# Patient Record
Sex: Male | Born: 1970 | Race: White | Hispanic: No | Marital: Married | Smoking: Never smoker
Health system: Southern US, Community
[De-identification: ages and names within clinical notes are randomized; demographics above are authoritative.]

## PROBLEM LIST (undated history)

## (undated) DIAGNOSIS — R0600 Dyspnea, unspecified: Secondary | ICD-10-CM

## (undated) DIAGNOSIS — I209 Angina pectoris, unspecified: Secondary | ICD-10-CM

## (undated) DIAGNOSIS — R000 Tachycardia, unspecified: Secondary | ICD-10-CM

## (undated) DIAGNOSIS — F419 Anxiety disorder, unspecified: Secondary | ICD-10-CM

## (undated) HISTORY — DX: Tachycardia, unspecified: R00.0

## (undated) HISTORY — PX: NO PAST SURGERIES: SHX2092

## (undated) HISTORY — DX: Anxiety disorder, unspecified: F41.9

---

## 2010-10-22 ENCOUNTER — Encounter: Payer: Self-pay | Admitting: Unknown Physician Specialty

## 2014-06-02 DIAGNOSIS — N509 Disorder of male genital organs, unspecified: Secondary | ICD-10-CM | POA: Insufficient documentation

## 2014-06-02 DIAGNOSIS — G43909 Migraine, unspecified, not intractable, without status migrainosus: Secondary | ICD-10-CM

## 2014-06-02 DIAGNOSIS — E291 Testicular hypofunction: Secondary | ICD-10-CM

## 2014-06-02 DIAGNOSIS — I10 Essential (primary) hypertension: Secondary | ICD-10-CM

## 2014-06-02 DIAGNOSIS — R10A0 Flank pain, unspecified side: Secondary | ICD-10-CM

## 2014-06-02 DIAGNOSIS — R109 Unspecified abdominal pain: Secondary | ICD-10-CM

## 2014-06-02 DIAGNOSIS — N411 Chronic prostatitis: Secondary | ICD-10-CM

## 2014-06-02 DIAGNOSIS — E7800 Pure hypercholesterolemia, unspecified: Secondary | ICD-10-CM

## 2014-06-02 HISTORY — DX: Flank pain, unspecified side: R10.A0

## 2014-06-02 HISTORY — DX: Testicular hypofunction: E29.1

## 2014-06-02 HISTORY — DX: Pure hypercholesterolemia, unspecified: E78.00

## 2014-06-02 HISTORY — DX: Essential (primary) hypertension: I10

## 2014-06-02 HISTORY — DX: Unspecified abdominal pain: R10.9

## 2014-06-02 HISTORY — DX: Chronic prostatitis: N41.1

## 2014-06-02 HISTORY — DX: Migraine, unspecified, not intractable, without status migrainosus: G43.909

## 2014-06-02 HISTORY — DX: Disorder of male genital organs, unspecified: N50.9

## 2017-04-02 DIAGNOSIS — N529 Male erectile dysfunction, unspecified: Secondary | ICD-10-CM | POA: Insufficient documentation

## 2017-04-02 HISTORY — DX: Male erectile dysfunction, unspecified: N52.9

## 2017-08-10 DIAGNOSIS — R079 Chest pain, unspecified: Secondary | ICD-10-CM | POA: Diagnosis not present

## 2020-06-15 DIAGNOSIS — R079 Chest pain, unspecified: Secondary | ICD-10-CM

## 2020-06-21 ENCOUNTER — Encounter: Payer: Self-pay | Admitting: *Deleted

## 2020-06-22 ENCOUNTER — Ambulatory Visit: Payer: BC Managed Care – PPO | Admitting: Cardiology

## 2020-06-22 ENCOUNTER — Other Ambulatory Visit: Payer: Self-pay

## 2020-06-22 ENCOUNTER — Encounter: Payer: Self-pay | Admitting: Cardiology

## 2020-06-22 VITALS — BP 140/100 | HR 78 | Ht 70.0 in | Wt 220.0 lb

## 2020-06-22 DIAGNOSIS — E669 Obesity, unspecified: Secondary | ICD-10-CM

## 2020-06-22 DIAGNOSIS — I10 Essential (primary) hypertension: Secondary | ICD-10-CM

## 2020-06-22 DIAGNOSIS — R002 Palpitations: Secondary | ICD-10-CM

## 2020-06-22 MED ORDER — DILTIAZEM HCL ER COATED BEADS 180 MG PO CP24: 180.0000 mg | ORAL_CAPSULE | Freq: Every day | ORAL | 1 refills | Status: DC

## 2020-06-22 MED ORDER — HYDROCHLOROTHIAZIDE 12.5 MG PO CAPS: 12.5000 mg | ORAL_CAPSULE | Freq: Every day | ORAL | 1 refills | Status: DC

## 2020-06-22 NOTE — Progress Notes (Signed)
Cardiology Office Note:    Date:  06/22/2020   ID:  Scott Mcguire, DOB [birth year], MRN [id number]  PCP:  Galvin Proffer, MD  Cardiologist:  Thomasene Ripple, DO  Electrophysiologist:  None   Referring MD: Galvin Proffer, MD   Chief Complaint  Patient presents with  . Chest Pain    History of Present Illness:    Scott Mcguire is a 49 y.o. male with a hx of hypertension, hypercholesteremia was referred by primary care doctor to be evaluated for palpitations.  The patient tells me that he has been experiencing intermittent palpitations.  He describes as abrupt onset of fast heartbeat which lasted for minutes sometimes hours at a time.  He noted that when this started in the beginning his PCP started him on Cardizem 120 mg which initially helped.  But over the last several weeks this has not helped at all.  The patient had an episode last Sunday where he was sitting and felt his heartbeat increase significantly in 10 since this was not improving he went to a panic attack his family did call EMS he was taken to the emergency department.  In emergency department he also complained about chest pain he was therefore admitted to the [facility].   During his hospitalization he underwent an exercise nuclear stress test.  The exercise portion of his nuclear stress test reported 8 METS with the patient achieving 93% of maximal age-predicted heart rate.  His peak blood pressure was 152/88 millimeters mercury.  Exercise time ~9 minutes 26 seconds.  He was reported to have occasional PVCs at rest.  The nuclear portion reported no reversible ischemia or infarction with normal wall motion.  However his EF was reported to be 48%.  The patient is here today, he tells me that he still is experiencing intermittent palpitations.  Past Medical History:  Diagnosis Date  . Anxiety   . Chronic prostatitis 06/02/2014  . Disorder of male genital organs 06/02/2014  . Flank pain 06/02/2014  .  Hypercholesterolemia 06/02/2014  . Hypertension 06/02/2014  . Hypogonadism in male 06/02/2014  . Migraine headache 06/02/2014  . 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 Tachycardia     Past Surgical History:  Procedure Laterality Date  . NO PAST SURGERIES      Current Medications: Current Meds  Medication Sig  . ALPRAZolam (XANAX) 1 MG tablet Take 1 mg by mouth at bedtime as needed.   Marland Kitchen atorvastatin (LIPITOR) 80 MG tablet Take 80 mg by mouth daily.  Marland Kitchen omeprazole (PRILOSEC) 40 MG capsule Take 40 mg by mouth daily.  . tadalafil (CIALIS) 5 MG tablet Take 5 mg by mouth daily as needed.  . [DISCONTINUED] diltiazem (TIAZAC) 120 MG 24 hr capsule Take 120 mg by mouth every morning.     Allergies:   Cefuroxime axetil   Social History   Socioeconomic History  . Marital status: Unknown    Spouse name: Not on file  . Number of children: Not on file  . Years of education: Not on file  . Highest education level: Not on file  Occupational History  . Not on file  Tobacco Use  . Smoking status: Never Smoker  . Smokeless tobacco: Never Used  Substance and Sexual Activity  . Alcohol use: Never  . Drug use: Never  . Sexual activity: Not on file  Other Topics Concern  . Not on file  Social History Narrative  . Not on file   Social Determinants of Health   Financial  Resource Strain:   . Difficulty of Paying Living Expenses: Not on file  Food Insecurity:   . Worried About Programme researcher, broadcasting/film/video in the Last Year: Not on file  . Ran Out of Food in the Last Year: Not on file  Transportation Needs:   . Lack of Transportation (Medical): Not on file  . Lack of Transportation (Non-Medical): Not on file  Physical Activity:   . Days of Exercise per Week: Not on file  . Minutes of Exercise per Session: Not on file  Stress:   . Feeling of Stress : Not on file  Social Connections:   . Frequency of Communication with Friends and Family: Not on file  . Frequency of Social Gatherings with Friends and Family:  Not on file  . Attends Religious Services: Not on file  . Active Member of Clubs or Organizations: Not on file  . Attends Banker Meetings: Not on file  . Marital Status: Not on file     Family History: The patient's family history includes Heart disease in his father and mother.  ROS:   Review of Systems  Constitution: Negative for decreased appetite, fever and weight gain.  HENT: Negative for congestion, ear discharge, hoarse voice and sore throat.   Eyes: Negative for discharge, redness, vision loss in right eye and visual halos.  Cardiovascular: Negative for chest pain, dyspnea on exertion, leg swelling, orthopnea and palpitations.  Respiratory: Negative for cough, hemoptysis, shortness of breath and snoring.   Endocrine: Negative for heat intolerance and polyphagia.  Hematologic/Lymphatic: Negative for bleeding problem. Does not bruise/bleed easily.  Skin: Negative for flushing, nail changes, rash and suspicious lesions.  Musculoskeletal: Negative for arthritis, joint pain, muscle cramps, myalgias, neck pain and stiffness.  Gastrointestinal: Negative for abdominal pain, bowel incontinence, diarrhea and excessive appetite.  Genitourinary: Negative for decreased libido, genital sores and incomplete emptying.  Neurological: Negative for brief paralysis, focal weakness, headaches and loss of balance.  Psychiatric/Behavioral: Negative for altered mental status, depression and suicidal ideas.  Allergic/Immunologic: Negative for HIV exposure and persistent infections.    EKGs/Labs/Other Studies Reviewed:    The following studies were reviewed today:   EKG:  The ekg ordered today demonstrates sinus rhythm, heart rate 72 bpm.  Echocardiogram done on May 19, 2020 with his PCP office showed normal left ventricular size and function.  Normal left medical systolic function.  Ejection fraction 55 to 60%.  Mild concentric left ventricular hypertrophy.  Normal diastolic  function.  Normal right ventricle size and systolic function.  Trace mitral regurgitation.  Mild tricuspid regurgitation.  Recent Labs: No results found for requested labs within last 8760 hours.  Recent Lipid Panel No results found for: CHOL, TRIG, HDL, CHOLHDL, VLDL, LDLCALC, LDLDIRECT  Physical Exam:    VS:  BP (!) 140/100 (BP Location: Left Arm, Patient Position: Sitting, Cuff Size: Normal)   Pulse 78   Ht 5\' 10"  (1.778 m)   Wt 220 lb (99.8 kg)   SpO2 98%   BMI 31.57 kg/m     Wt Readings from Last 3 Encounters:  06/22/20 220 lb (99.8 kg)     GEN: Well nourished, well developed in no acute distress HEENT: Normal NECK: No JVD; No carotid bruits LYMPHATICS: No lymphadenopathy CARDIAC: S1S2 noted,RRR, no murmurs, rubs, gallops RESPIRATORY:  Clear to auscultation without rales, wheezing or rhonchi  ABDOMEN: Soft, non-tender, non-distended, +bowel sounds, no guarding. EXTREMITIES: No edema, No cyanosis, no clubbing MUSCULOSKELETAL:  No deformity  SKIN: Warm and  dry NEUROLOGIC:  Alert and oriented x 3, non-focal PSYCHIATRIC:  Normal affect, good insight  ASSESSMENT:    1. Palpitations   2. Essential hypertension   3. Obesity (BMI 30-39.9)    PLAN:     His palpitations are concerning and the frequency is higher.  I have discussed with the patient and he is agreeable to placed a ZIO monitor for 3 days.  I am hoping to be able to understand if there is any underlying arrhythmia with his palpitations.  I was able to review his echocardiogram done by his PCP office which show EF of 55 to 60%.  With mild left ventricular concentric hypertrophy.  There is mild tricuspid regurgitation and trace mitral regurgitation.  He is hypertensive in the office.  He was on hydrochlorothiazide which he responded to a greatly and was stopped due to the start of Cardizem.  I am going to restart his hydrochlorothiazide 12.5 mg daily.   Also increase his Cardizem to 180 mg daily.  He will start  this dosing right after he takes off his 3-day monitor.  The patient understands the need to lose weight with diet and exercise. We have discussed specific strategies for this.  The patient is in agreement with the above plan. The patient left the office in stable condition.  The patient will follow up in 1 month due to medication change.   Medication Adjustments/Labs and Tests Ordered: Current medicines are reviewed at length with the patient today.  Concerns regarding medicines are outlined above.  No orders of the defined types were placed in this encounter.  No orders of the defined types were placed in this encounter.   There are no Patient Instructions on file for this visit.   Adopting a Healthy Lifestyle.  Know what a healthy weight is for you (roughly BMI <25) and aim to maintain this   Aim for 7+ servings of fruits and vegetables daily   65-80+ fluid ounces of water or unsweet tea for healthy kidneys   Limit to max 1 drink of alcohol per day; avoid smoking/tobacco   Limit animal fats in diet for cholesterol and heart health - choose grass fed whenever available   Avoid highly processed foods, and foods high in saturated/trans fats   Aim for low stress - take time to unwind and care for your mental health   Aim for 150 min of moderate intensity exercise weekly for heart health, and weights twice weekly for bone health   Aim for 7-9 hours of sleep daily   When it comes to diets, agreement about the perfect plan isnt easy to find, even among the experts. Experts at the [facility] [employer] developed an idea known as the Healthy Eating Plate. Just imagine a plate divided into logical, healthy portions.   The emphasis is on diet quality:   Load up on vegetables and fruits - one-half of your plate: Aim for color and variety, and remember that potatoes dont count.   Go for whole grains - one-quarter of your plate: Whole wheat, barley, wheat berries, quinoa,  oats, brown rice, and foods made with them. If you want pasta, go with whole wheat pasta.   Protein power - one-quarter of your plate: Fish, chicken, beans, and nuts are all healthy, versatile protein sources. Limit red meat.   The diet, however, does go beyond the plate, offering a few other suggestions.   Use healthy plant oils, such as olive, canola, soy, corn, sunflower and  peanut. Check the labels, and avoid partially hydrogenated oil, which have unhealthy trans fats.   If youre thirsty, drink water. Coffee and tea are good in moderation, but skip sugary drinks and limit milk and dairy products to one or two daily servings.   The type of carbohydrate in the diet is more important than the amount. Some sources of carbohydrates, such as vegetables, fruits, whole grains, and beans-are healthier than others.   Finally, stay active  Signed, Thomasene RippleKardie Briona Korpela, DO  06/22/2020 10:30 AM    [facility]

## 2020-06-22 NOTE — Patient Instructions (Signed)
Medication Instructions:  Your physician has recommended you make the following change in your medication:   INCREASE: Cardizem to 180 mg daily   START: Hydrochlorothiazide to 12.5 mg daily   *If you need a refill on your cardiac medications before your next appointment, please call your pharmacy*   Lab Work: None.  If you have labs (blood work) drawn today and your tests are completely normal, you will receive your results only by: Marland Kitchen MyChart Message (if you have MyChart) OR . A paper copy in the mail If you have any lab test that is abnormal or we need to change your treatment, we will call you to review the results.   Testing/Procedures: A zio monitor was ordered today. It will remain on for 3 days. You will then return monitor and event diary in provided box. It takes 1-2 weeks for report to be downloaded and returned to [place]. We will call you with the results. If monitor falls off or has orange flashing light, please call Zio for further instructions.      Follow-Up: At [facility], you and your health needs are our priority.  As part of our continuing mission to provide you with exceptional heart care, we have created designated Provider Care Teams.  These Care Teams include your primary Cardiologist (physician) and Advanced Practice Providers (APPs -  Physician Assistants and Nurse Practitioners) who all work together to provide you with the care you need, when you need it.  We recommend signing up for the patient portal called "MyChart".  Sign up information is provided on this After Visit Summary.  MyChart is used to connect with patients for Virtual Visits (Telemedicine).  Patients are able to view lab/test results, encounter notes, upcoming appointments, etc.  Non-urgent messages can be sent to your provider as well.   To learn more about what you can do with MyChart, go to [email or web address].    Your next appointment:   1 month(s)  The format for your next  appointment:   In Person  Provider:   Thomasene Ripple, DO   Other Instructions

## 2020-06-26 ENCOUNTER — Ambulatory Visit (INDEPENDENT_AMBULATORY_CARE_PROVIDER_SITE_OTHER): Payer: BC Managed Care – PPO

## 2020-06-26 DIAGNOSIS — R002 Palpitations: Secondary | ICD-10-CM

## 2020-07-23 ENCOUNTER — Encounter: Payer: Self-pay | Admitting: Cardiology

## 2020-07-23 ENCOUNTER — Ambulatory Visit: Payer: BC Managed Care – PPO | Admitting: Cardiology

## 2020-07-23 ENCOUNTER — Other Ambulatory Visit: Payer: Self-pay

## 2020-07-23 VITALS — BP 130/88 | HR 99 | Ht 70.0 in | Wt 221.8 lb

## 2020-07-23 DIAGNOSIS — I493 Ventricular premature depolarization: Secondary | ICD-10-CM

## 2020-07-23 DIAGNOSIS — I471 Supraventricular tachycardia: Secondary | ICD-10-CM | POA: Insufficient documentation

## 2020-07-23 DIAGNOSIS — I4719 Other supraventricular tachycardia: Secondary | ICD-10-CM

## 2020-07-23 DIAGNOSIS — E669 Obesity, unspecified: Secondary | ICD-10-CM

## 2020-07-23 DIAGNOSIS — I10 Essential (primary) hypertension: Secondary | ICD-10-CM | POA: Diagnosis not present

## 2020-07-23 HISTORY — DX: Other supraventricular tachycardia: I47.19

## 2020-07-23 HISTORY — DX: Ventricular premature depolarization: I49.3

## 2020-07-23 HISTORY — DX: Obesity, unspecified: E66.9

## 2020-07-23 MED ORDER — DILTIAZEM HCL ER COATED BEADS 240 MG PO CP24: 240.0000 mg | ORAL_CAPSULE | Freq: Every day | ORAL | 3 refills | Status: DC

## 2020-07-23 NOTE — Progress Notes (Signed)
Cardiology Office Note:    Date:  07/23/2020   ID:  Scott Mcguire, DOB 1971-02-09, MRN [id number]  PCP:  Galvin Proffer, MD  Cardiologist:  Thomasene Ripple, DO  Electrophysiologist:  None   Referring MD: Galvin Proffer, MD   " I am doing well"  History of Present Illness:    Scott Mcguire is a 49 y.o. male with a hx of hypertension, hypercholesteremia comes today for follow-up visit. Did see the patient on 06/22/2020 after he had been discharged from the [facility].  We reviewed his hospital records which showed that he had adequate exercise time but occasional PVCs. Because of this I placed a monitor on the patient.  He is here today to discuss his testing results.  Past Medical History:  Diagnosis Date  . Anxiety   . Chronic prostatitis 06/02/2014  . Disorder of male genital organs 06/02/2014  . Flank pain 06/02/2014  . Hypercholesterolemia 06/02/2014  . Hypertension 06/02/2014  . Hypogonadism in male 06/02/2014  . Migraine headache 06/02/2014  . 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 Tachycardia     Past Surgical History:  Procedure Laterality Date  . NO PAST SURGERIES      Current Medications: Current Meds  Medication Sig  . ALPRAZolam (XANAX) 1 MG tablet Take 1 mg by mouth at bedtime as needed.   Marland Kitchen atorvastatin (LIPITOR) 80 MG tablet Take 80 mg by mouth daily.  . hydrochlorothiazide (MICROZIDE) 12.5 MG capsule Take 1 capsule (12.5 mg total) by mouth daily.  Marland Kitchen omeprazole (PRILOSEC) 40 MG capsule Take 40 mg by mouth daily.  . tadalafil (CIALIS) 5 MG tablet Take 5 mg by mouth daily as needed.  . [DISCONTINUED] diltiazem (CARDIZEM CD) 180 MG 24 hr capsule Take 1 capsule (180 mg total) by mouth daily.     Allergies:   Cefuroxime axetil   Social History   Socioeconomic History  . Marital status: Unknown    Spouse name: Not on file  . Number of children: Not on file  . Years of education: Not on file  . Highest education level: Not on file  Occupational  History  . Not on file  Tobacco Use  . Smoking status: Never Smoker  . Smokeless tobacco: Never Used  Substance and Sexual Activity  . Alcohol use: Never  . Drug use: Never  . Sexual activity: Not on file  Other Topics Concern  . Not on file  Social History Narrative  . Not on file   Social Determinants of Health   Financial Resource Strain:   . Difficulty of Paying Living Expenses: Not on file  Food Insecurity:   . Worried About Programme researcher, broadcasting/film/video in the Last Year: Not on file  . Ran Out of Food in the Last Year: Not on file  Transportation Needs:   . Lack of Transportation (Medical): Not on file  . Lack of Transportation (Non-Medical): Not on file  Physical Activity:   . Days of Exercise per Week: Not on file  . Minutes of Exercise per Session: Not on file  Stress:   . Feeling of Stress : Not on file  Social Connections:   . Frequency of Communication with Friends and Family: Not on file  . Frequency of Social Gatherings with Friends and Family: Not on file  . Attends Religious Services: Not on file  . Active Member of Clubs or Organizations: Not on file  . Attends Banker Meetings: Not on file  . Marital Status:  Not on file     Family History: The patient's family history includes Heart disease in his father and mother.  ROS:   Review of Systems  Constitution: Negative for decreased appetite, fever and weight gain.  HENT: Negative for congestion, ear discharge, hoarse voice and sore throat.   Eyes: Negative for discharge, redness, vision loss in right eye and visual halos.  Cardiovascular: Negative for chest pain, dyspnea on exertion, leg swelling, orthopnea and palpitations.  Respiratory: Negative for cough, hemoptysis, shortness of breath and snoring.   Endocrine: Negative for heat intolerance and polyphagia.  Hematologic/Lymphatic: Negative for bleeding problem. Does not bruise/bleed easily.  Skin: Negative for flushing, nail changes, rash and  suspicious lesions.  Musculoskeletal: Negative for arthritis, joint pain, muscle cramps, myalgias, neck pain and stiffness.  Gastrointestinal: Negative for abdominal pain, bowel incontinence, diarrhea and excessive appetite.  Genitourinary: Negative for decreased libido, genital sores and incomplete emptying.  Neurological: Negative for brief paralysis, focal weakness, headaches and loss of balance.  Psychiatric/Behavioral: Negative for altered mental status, depression and suicidal ideas.  Allergic/Immunologic: Negative for HIV exposure and persistent infections.    EKGs/Labs/Other Studies Reviewed:    The following studies were reviewed today:   EKG:  The ekg ordered today demonstrates   ZIO monitor  The patient wore the monitor for 3 days 1 hour starting 06/26/2020. Indication: Palpitations  The minimum heart rate was 52 bpm, maximum heart rate was 196 bpm, and average heart rate was 83 bpm. Predominant underlying rhythm was Sinus Rhythm.  2 Supraventricular Tachycardia runs occurred, the run with the fastest interval lasting 5 beats with a maximum rate of 171 bpm (average 146 bpm); the run with the fastest interval was also the longest.  Premature atrial complexes were rare.  Premature Ventricular complexes were occasional (1.0%, 3730).  No ventricular tachycardia, no pauses, No AV block and no atrial fibrillation present. 3 patient triggered events all associated with premature ventricular complex. 5 diary events 3 associated with premature ventricular complex and the remaining with sinus rhythm.  Conclusion: This study is remarkable for the following:                             1. Rare asymptomatic atrial tachycardia with variable block.                             2. Occasional premature ventricular complexes.  Recent Labs: No results found for requested labs within last 8760 hours.  Recent Lipid Panel No results found for: CHOL, TRIG, HDL, CHOLHDL, VLDL, LDLCALC,  LDLDIRECT  Physical Exam:    VS:  BP 130/88   Pulse 99   Ht 5\' 10"  (1.778 m)   Wt 221 lb 12.8 oz (100.6 kg)   SpO2 97%   BMI 31.82 kg/m     Wt Readings from Last 3 Encounters:  07/23/20 221 lb 12.8 oz (100.6 kg)  06/22/20 220 lb (99.8 kg)     GEN: Well nourished, well developed in no acute distress HEENT: Normal NECK: No JVD; No carotid bruits LYMPHATICS: No lymphadenopathy CARDIAC: S1S2 noted,RRR, no murmurs, rubs, gallops RESPIRATORY:  Clear to auscultation without rales, wheezing or rhonchi  ABDOMEN: Soft, non-tender, non-distended, +bowel sounds, no guarding. EXTREMITIES: No edema, No cyanosis, no clubbing MUSCULOSKELETAL:  No deformity  SKIN: Warm and dry NEUROLOGIC:  Alert and oriented x 3, non-focal PSYCHIATRIC:  Normal affect, good insight  ASSESSMENT:    1. Primary hypertension   2. Symptomatic PVCs   3. PAT (paroxysmal atrial tachycardia) (HCC)   4. Obesity (BMI 30-39.9)    PLAN:    He still is having some palpitations. He is now well controlled on the dose of Cardizem now. Talked to the patient like to increase his Cardizem to 240 mg daily as his monitor did show some symptomatic PVCs. He is in agreement with this.  I did educate the patient about the side effects of this medication of his questions has been answered. His blood pressure will be able to tolerate this medication.   The patient understands the need to lose weight with diet and exercise. We have discussed specific strategies for this.  The patient is in agreement with the above plan. The patient left the office in stable condition.  The patient will follow up in 3 months or sooner if needed.  Medication Adjustments/Labs and Tests Ordered: Current medicines are reviewed at length with the patient today.  Concerns regarding medicines are outlined above.  No orders of the defined types were placed in this encounter.  Meds ordered this encounter  Medications  . diltiazem (CARDIZEM CD) 240 MG 24  hr capsule    Sig: Take 1 capsule (240 mg total) by mouth daily.    Dispense:  90 capsule    Refill:  3    DOSE INCREASE    Patient Instructions  Medication Instructions:  Your physician has recommended you make the following change in your medication:  -- INCREASE Cardizem (Diltiazem) to 240 mg - Take 1 tablet (240 mg) by mouth daily -- NEW RX SENT --  *If you need a refill on your cardiac medications before your next appointment, please call your pharmacy*  Follow-Up: At [facility], you and your health needs are our priority.  As part of our continuing mission to provide you with exceptional heart care, we have created designated Provider Care Teams.  These Care Teams include your primary Cardiologist (physician) and Advanced Practice Providers (APPs -  Physician Assistants and Nurse Practitioners) who all work together to provide you with the care you need, when you need it.  We recommend signing up for the patient portal called "MyChart".  Sign up information is provided on this After Visit Summary.  MyChart is used to connect with patients for Virtual Visits (Telemedicine).  Patients are able to view lab/test results, encounter notes, upcoming appointments, etc.  Non-urgent messages can be sent to your provider as well.   To learn more about what you can do with MyChart, go to [email or web address].    Your next appointment:   Your physician recommends that you schedule a follow-up appointment in: 3 MONTHS with Dr. Servando Salinaobb  The format for your next appointment:   In Person with Thomasene RippleKardie Annessa Satre, DO        Adopting a Healthy Lifestyle.  Know what a healthy weight is for you (roughly BMI <25) and aim to maintain this   Aim for 7+ servings of fruits and vegetables daily   65-80+ fluid ounces of water or unsweet tea for healthy kidneys   Limit to max 1 drink of alcohol per day; avoid smoking/tobacco   Limit animal fats in diet for cholesterol and heart health - choose grass fed  whenever available   Avoid highly processed foods, and foods high in saturated/trans fats   Aim for low stress - take time to unwind and care for your mental health  Aim for 150 min of moderate intensity exercise weekly for heart health, and weights twice weekly for bone health   Aim for 7-9 hours of sleep daily   When it comes to diets, agreement about the perfect plan isnt easy to find, even among the experts. Experts at the [facility] [employer] developed an idea known as the Healthy Eating Plate. Just imagine a plate divided into logical, healthy portions.   The emphasis is on diet quality:   Load up on vegetables and fruits - one-half of your plate: Aim for color and variety, and remember that potatoes dont count.   Go for whole grains - one-quarter of your plate: Whole wheat, barley, wheat berries, quinoa, oats, brown rice, and foods made with them. If you want pasta, go with whole wheat pasta.   Protein power - one-quarter of your plate: Fish, chicken, beans, and nuts are all healthy, versatile protein sources. Limit red meat.   The diet, however, does go beyond the plate, offering a few other suggestions.   Use healthy plant oils, such as olive, canola, soy, corn, sunflower and peanut. Check the labels, and avoid partially hydrogenated oil, which have unhealthy trans fats.   If youre thirsty, drink water. Coffee and tea are good in moderation, but skip sugary drinks and limit milk and dairy products to one or two daily servings.   The type of carbohydrate in the diet is more important than the amount. Some sources of carbohydrates, such as vegetables, fruits, whole grains, and beans-are healthier than others.   Finally, stay active  Signed, Thomasene Ripple, DO  07/23/2020 1:31 PM    [facility]

## 2020-07-23 NOTE — Patient Instructions (Signed)
Medication Instructions:  Your physician has recommended you make the following change in your medication:  -- INCREASE Cardizem (Diltiazem) to 240 mg - Take 1 tablet (240 mg) by mouth daily -- NEW RX SENT --  *If you need a refill on your cardiac medications before your next appointment, please call your pharmacy*  Follow-Up: At [facility], you and your health needs are our priority.  As part of our continuing mission to provide you with exceptional heart care, we have created designated Provider Care Teams.  These Care Teams include your primary Cardiologist (physician) and Advanced Practice Providers (APPs -  Physician Assistants and Nurse Practitioners) who all work together to provide you with the care you need, when you need it.  We recommend signing up for the patient portal called "MyChart".  Sign up information is provided on this After Visit Summary.  MyChart is used to connect with patients for Virtual Visits (Telemedicine).  Patients are able to view lab/test results, encounter notes, upcoming appointments, etc.  Non-urgent messages can be sent to your provider as well.   To learn more about what you can do with MyChart, go to [email or web address].    Your next appointment:   Your physician recommends that you schedule a follow-up appointment in: 3 MONTHS with Dr. Servando Salina  The format for your next appointment:   In Person with Thomasene Ripple, DO

## 2020-10-28 DIAGNOSIS — F419 Anxiety disorder, unspecified: Secondary | ICD-10-CM | POA: Insufficient documentation

## 2020-10-28 DIAGNOSIS — R000 Tachycardia, unspecified: Secondary | ICD-10-CM | POA: Insufficient documentation

## 2020-10-29 ENCOUNTER — Ambulatory Visit: Payer: BC Managed Care – PPO | Admitting: Cardiology

## 2020-10-29 ENCOUNTER — Encounter: Payer: Self-pay | Admitting: Cardiology

## 2020-10-29 ENCOUNTER — Other Ambulatory Visit: Payer: Self-pay

## 2020-10-29 VITALS — BP 138/80 | HR 76 | Ht 70.0 in | Wt 228.2 lb

## 2020-10-29 DIAGNOSIS — R0600 Dyspnea, unspecified: Secondary | ICD-10-CM | POA: Diagnosis not present

## 2020-10-29 DIAGNOSIS — R0609 Other forms of dyspnea: Secondary | ICD-10-CM

## 2020-10-29 DIAGNOSIS — I471 Supraventricular tachycardia: Secondary | ICD-10-CM

## 2020-10-29 DIAGNOSIS — I10 Essential (primary) hypertension: Secondary | ICD-10-CM

## 2020-10-29 DIAGNOSIS — E782 Mixed hyperlipidemia: Secondary | ICD-10-CM

## 2020-10-29 DIAGNOSIS — I493 Ventricular premature depolarization: Secondary | ICD-10-CM

## 2020-10-29 HISTORY — DX: Mixed hyperlipidemia: E78.2

## 2020-10-29 HISTORY — DX: Essential (primary) hypertension: I10

## 2020-10-29 HISTORY — DX: Other forms of dyspnea: R06.09

## 2020-10-29 MED ORDER — METOPROLOL TARTRATE 100 MG PO TABS: ORAL_TABLET | ORAL | 0 refills | Status: DC

## 2020-10-29 MED ORDER — PROPRANOLOL HCL 20 MG PO TABS: ORAL_TABLET | ORAL | 2 refills | Status: DC

## 2020-10-29 NOTE — Patient Instructions (Addendum)
Medication Instructions:  Your physician has recommended you make the following change in your medication:  START: Propanolol 20 mg as needed if heart rate is greater than 120.  *If you need a refill on your cardiac medications before your next appointment, please call your pharmacy*   Lab Work: Your physician recommends that you return for lab work: Within one week of your cardiac CT BMP  If you have labs (blood work) drawn today and your tests are completely normal, you will receive your results only by: Marland Kitchen MyChart Message (if you have MyChart) OR . A paper copy in the mail If you have any lab test that is abnormal or we need to change your treatment, we will call you to review the results.   Testing/Procedures: Your cardiac CT will be scheduled at the below location:   [facility] [address] [place], [place] [address] [phone]   If scheduled at [facility], please arrive at the [facility] main entrance of [facility] 30 minutes prior to test start time. Proceed to the [facility] Radiology Department (first floor) to check-in and test prep.   Please follow these instructions carefully (unless otherwise directed):  Hold all erectile dysfunction medications at least 3 days (72 hrs) prior to test.  On the Night Before the Test: . Be sure to Drink plenty of water. . Do not consume any caffeinated/decaffeinated beverages or chocolate 12 hours prior to your test. . Do not take any antihistamines 12 hours prior to your test.  On the Day of the Test: . Drink plenty of water. Do not drink any water within one hour of the test. . Do not eat any food 4 hours prior to the test. . You may take your regular medications prior to the test.  . Take metoprolol (Lopressor) two hours prior to test. . HOLD Hydrochlorothiazide morning of the test.        After the Test: . Drink plenty of water. . After receiving IV contrast, you may experience a  mild flushed feeling. This is normal. . On occasion, you may experience a mild rash up to 24 hours after the test. This is not dangerous. If this occurs, you can take Benadryl 25 mg and increase your fluid intake. . If you experience trouble breathing, this can be serious. If it is severe call 911 IMMEDIATELY. If it is mild, please call our office. . If you take any of these medications: Glipizide/Metformin, Avandament, Glucavance, please do not take 48 hours after completing test unless otherwise instructed.   Once we have confirmed authorization from your insurance company, we will call you to set up a date and time for your test. Based on how quickly your insurance processes prior authorizations requests, please allow up to 4 weeks to be contacted for scheduling your Cardiac CT appointment. Be advised that routine Cardiac CT appointments could be scheduled as many as 8 weeks after your provider has ordered it.  For non-scheduling related questions, please contact the cardiac imaging nurse navigator should you have any questions/concerns: Marchia Bond, Cardiac Imaging Nurse Navigator Burley Saver, Interim Cardiac Imaging Nurse [place] [facility] Direct Office Dial: [phone]   For scheduling needs, including cancellations and rescheduling, please call [place], [phone].     Follow-Up: At [facility], you and your health needs are our priority.  As part of our continuing mission to provide you with exceptional heart care, we have created designated Provider Care  Teams.  These Care Teams include your primary Cardiologist (physician) and Advanced Practice Providers (APPs -  Physician Assistants and Nurse Practitioners) who all work together to provide you with the care you need, when you need it.  We recommend signing up for the patient portal called "MyChart".  Sign up information is provided on this After Visit Summary.  MyChart is used to connect with  patients for Virtual Visits (Telemedicine).  Patients are able to view lab/test results, encounter notes, upcoming appointments, etc.  Non-urgent messages can be sent to your provider as well.   To learn more about what you can do with MyChart, go to [email or web address].    Your next appointment:   6 month(s)  The format for your next appointment:   In Person  Provider:   Berniece Salines, DO   Other Instructions

## 2020-10-29 NOTE — Progress Notes (Signed)
Cardiology Office Note:    Date:  10/29/2020   ID:  Scott Mcguire, DOB [birth year], MRN [id number]  PCP:  Bonnita Nasuti, MD  Cardiologist:  Berniece Salines, DO  Electrophysiologist:  None   Referring MD: Bonnita Nasuti, MD   I am still having shortness of breath and is very bothersome.  History of Present Illness:    Scott Mcguire is a 50 y.o. male with a hx of hypertension, hypercholesterolemia, symptomatic PVCs, versus major tachycardia is here today for follow-up visit.  I saw the patient October 2021 at that time he was well controlled with his Cardizem we increased the dose to 240 mg to help with his symptoms.  Today he tells me that his palpitation has improved with only few intermittent episodes but he is having significant shortness of breath on exertion.  He notes that this is the most bothersome symptoms.  His ongoing significant chest pain.  But he tells me on exertion he is very dyspneic.  Past Medical History:  Diagnosis Date  . Anxiety   . Chronic prostatitis 06/02/2014  . Disorder of male genital organs 06/02/2014  . Flank pain 06/02/2014  . Hypercholesterolemia 06/02/2014  . Hypertension 06/02/2014  . Hypogonadism in male 06/02/2014  . Migraine headache 06/02/2014  . 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 Tachycardia     Past Surgical History:  Procedure Laterality Date  . NO PAST SURGERIES      Current Medications: Current Meds  Medication Sig  . ALPRAZolam (XANAX) 1 MG tablet Take 1 mg by mouth at bedtime as needed.   Marland Kitchen atorvastatin (LIPITOR) 80 MG tablet Take 80 mg by mouth daily.  Marland Kitchen diltiazem (CARDIZEM CD) 240 MG 24 hr capsule Take 1 capsule (240 mg total) by mouth daily.  . hydrochlorothiazide (MICROZIDE) 12.5 MG capsule Take 1 capsule (12.5 mg total) by mouth daily.  . metoprolol tartrate (LOPRESSOR) 100 MG tablet Take 2 hours prior to Cardiac CT  . omeprazole (PRILOSEC) 40 MG capsule Take 40 mg by mouth daily.  . propranolol (INDERAL) 20 MG tablet Take one tablet  by mouth as needed if heart rate greater then 120  . tadalafil (CIALIS) 5 MG tablet Take 5 mg by mouth daily as needed.     Allergies:   Cefuroxime axetil   Social History   Socioeconomic History  . Marital status: Unknown    Spouse name: Not on file  . Number of children: Not on file  . Years of education: Not on file  . Highest education level: Not on file  Occupational History  . Not on file  Tobacco Use  . Smoking status: Never Smoker  . Smokeless tobacco: Never Used  Substance and Sexual Activity  . Alcohol use: Never  . Drug use: Never  . Sexual activity: Not on file  Other Topics Concern  . Not on file  Social History Narrative  . Not on file   Social Determinants of Health   Financial Resource Strain: Not on file  Food Insecurity: Not on file  Transportation Needs: Not on file  Physical Activity: Not on file  Stress: Not on file  Social Connections: Not on file     Family History: The patient's family history includes Heart disease in his father and mother.  ROS:   Review of Systems  Constitution: Negative for decreased appetite, fever and weight gain.  HENT: Negative for congestion, ear discharge, hoarse voice and sore throat.   Eyes: Negative for discharge, redness, vision loss in  right eye and visual halos.  Cardiovascular: Negative for chest pain, dyspnea on exertion, leg swelling, orthopnea and palpitations.  Respiratory: Negative for cough, hemoptysis, shortness of breath and snoring.   Endocrine: Negative for heat intolerance and polyphagia.  Hematologic/Lymphatic: Negative for bleeding problem. Does not bruise/bleed easily.  Skin: Negative for flushing, nail changes, rash and suspicious lesions.  Musculoskeletal: Negative for arthritis, joint pain, muscle cramps, myalgias, neck pain and stiffness.  Gastrointestinal: Negative for abdominal pain, bowel incontinence, diarrhea and excessive appetite.  Genitourinary: Negative for decreased libido,  genital sores and incomplete emptying.  Neurological: Negative for brief paralysis, focal weakness, headaches and loss of balance.  Psychiatric/Behavioral: Negative for altered mental status, depression and suicidal ideas.  Allergic/Immunologic: Negative for HIV exposure and persistent infections.    EKGs/Labs/Other Studies Reviewed:    The following studies were reviewed today:   EKG: None today  ZIO monitor  The patient wore the monitor for 3 days 1 hourstarting 06/26/2020. Indication: Palpitations  The minimum heart rate was 52 bpm, maximum heart rate was 196 bpm, and average heart rate was 83 bpm. Predominant underlying rhythm was Sinus Rhythm.  2 Supraventricular Tachycardia runs occurred, the run with the fastest interval lasting 5 beats with a maximum rate of 171 bpm (average 146 bpm); the run with the fastest interval was also the longest.  Premature atrial complexes were rare.  Premature Ventricular complexes were occasional (1.0%, 3730).  No ventricular tachycardia, no pauses, No AV block and no atrial fibrillation present. 3 patient triggered events all associated with premature ventricular complex. 5 diary events 3 associated with premature ventricular complex and the remaining with sinus rhythm.  Conclusion: This study is remarkable for the following: 1. Rare asymptomatic atrial tachycardia with variable block. 2. Occasional premature ventricular complexes.   Recent Labs: No results found for requested labs within last 8760 hours.  Recent Lipid Panel No results found for: CHOL, TRIG, HDL, CHOLHDL, VLDL, LDLCALC, LDLDIRECT  Physical Exam:    VS:  BP 138/80   Pulse 76   Ht _0  (1.778 m)   Wt 228 lb 3.2 oz (103.5 kg)   SpO2 97%   BMI 32.74 kg/m     Wt Readings from Last 3 Encounters:  10/29/20 228 lb 3.2 oz (103.5 kg)  07/23/20 221 lb 12.8 oz (100.6 kg)  06/22/20 220 lb (99.8 kg)     GEN:  Well nourished, well developed in no acute distress HEENT: Normal NECK: No JVD; No carotid bruits LYMPHATICS: No lymphadenopathy CARDIAC: S1S2 noted,RRR, no murmurs, rubs, gallops RESPIRATORY:  Clear to auscultation without rales, wheezing or rhonchi  ABDOMEN: Soft, non-tender, non-distended, +bowel sounds, no guarding. EXTREMITIES: No edema, No cyanosis, no clubbing MUSCULOSKELETAL:  No deformity  SKIN: Warm and dry NEUROLOGIC:  Alert and oriented x 3, non-focal PSYCHIATRIC:  Normal affect, good insight  ASSESSMENT:    1. Dyspnea on exertion   2. Essential hypertension   3. Mixed hyperlipidemia   4. Symptomatic PVCs   5. PAT (paroxysmal atrial tachycardia) (HCC)    PLAN:     1.  Get a stress test at [facility] in September 2021 which was reported to be normal.  But his symptoms is persistent and is concerning.  His chest pain is concerning patient does have intermediate risk for coronary artery disease would not like to do is pursue an ischemic evaluation in this patient.  Shared decision a coronary CTA at this time is appropriate.  I have discussed with the patient about  the testing.  The patient has no IV contrast allergy and is agreeable to proceed with this test.  2.  In terms of his outburst of intermittent palpitation I like to add propanolol 20 mg as needed.  3.  The patient understands the need to lose weight with diet and exercise. We have discussed specific strategies for this.  The patient is in agreement with the above plan. The patient left the office in stable condition.  The patient will follow up in 3 months or sooner if needed.   Medication Adjustments/Labs and Tests Ordered: Current medicines are reviewed at length with the patient today.  Concerns regarding medicines are outlined above.  Orders Placed This Encounter  Procedures  . Basic metabolic panel   Meds ordered this encounter  Medications  . metoprolol tartrate (LOPRESSOR) 100 MG tablet     Sig: Take 2 hours prior to Cardiac CT    Dispense:  1 tablet    Refill:  0  . propranolol (INDERAL) 20 MG tablet    Sig: Take one tablet by mouth as needed if heart rate greater then 120    Dispense:  30 tablet    Refill:  2    Patient Instructions  Medication Instructions:  Your physician has recommended you make the following change in your medication:  START: Propanolol 20 mg as needed if heart rate is greater than 120.  *If you need a refill on your cardiac medications before your next appointment, please call your pharmacy*   Lab Work: Your physician recommends that you return for lab work: Within one week of your cardiac CT BMP  If you have labs (blood work) drawn today and your tests are completely normal, you will receive your results only by: Marland Kitchen MyChart Message (if you have MyChart) OR . A paper copy in the mail If you have any lab test that is abnormal or we need to change your treatment, we will call you to review the results.   Testing/Procedures: Your cardiac CT will be scheduled at the below location:   [facility] [address] [place], [place] [address] [phone]   If scheduled at [facility], please arrive at the [facility] main entrance of [facility] 30 minutes prior to test start time. Proceed to the [facility] Radiology Department (first floor) to check-in and test prep.   Please follow these instructions carefully (unless otherwise directed):  Hold all erectile dysfunction medications at least 3 days (72 hrs) prior to test.  On the Night Before the Test: . Be sure to Drink plenty of water. . Do not consume any caffeinated/decaffeinated beverages or chocolate 12 hours prior to your test. . Do not take any antihistamines 12 hours prior to your test.  On the Day of the Test: . Drink plenty of water. Do not drink any water within one hour of the test. . Do not eat any food 4 hours prior to the test. . You may take  your regular medications prior to the test.  . Take metoprolol (Lopressor) two hours prior to test. . HOLD Hydrochlorothiazide morning of the test.        After the Test: . Drink plenty of water. . After receiving IV contrast, you may experience a mild flushed feeling. This is normal. . On occasion, you may experience a mild rash up to 24 hours after the test. This is not dangerous. If this occurs, you can take Benadryl 25 mg and increase  your fluid intake. . If you experience trouble breathing, this can be serious. If it is severe call 911 IMMEDIATELY. If it is mild, please call our office. . If you take any of these medications: Glipizide/Metformin, Avandament, Glucavance, please do not take 48 hours after completing test unless otherwise instructed.   Once we have confirmed authorization from your insurance company, we will call you to set up a date and time for your test. Based on how quickly your insurance processes prior authorizations requests, please allow up to 4 weeks to be contacted for scheduling your Cardiac CT appointment. Be advised that routine Cardiac CT appointments could be scheduled as many as 8 weeks after your provider has ordered it.  For non-scheduling related questions, please contact the cardiac imaging nurse navigator should you have any questions/concerns: Marchia Bond, Cardiac Imaging Nurse Navigator Burley Saver, Interim Cardiac Imaging Nurse [place] [facility] [facility] Direct Office Dial: [phone]   For scheduling needs, including cancellations and rescheduling, please call [place], [phone].     Follow-Up: At [facility], you and your health needs are our priority.  As part of our continuing mission to provide you with exceptional heart care, we have created designated Provider Care Teams.  These Care Teams include your primary Cardiologist (physician) and Advanced Practice Providers (APPs -  Physician Assistants and Nurse  Practitioners) who all work together to provide you with the care you need, when you need it.  We recommend signing up for the patient portal called "MyChart".  Sign up information is provided on this After Visit Summary.  MyChart is used to connect with patients for Virtual Visits (Telemedicine).  Patients are able to view lab/test results, encounter notes, upcoming appointments, etc.  Non-urgent messages can be sent to your provider as well.   To learn more about what you can do with MyChart, go to [email or web address].    Your next appointment:   6 month(s)  The format for your next appointment:   In Person  Provider:   Berniece Salines, DO   Other Instructions      Adopting a Healthy Lifestyle.  Know what a healthy weight is for you (roughly BMI <25) and aim to maintain this   Aim for 7+ servings of fruits and vegetables daily   65-80+ fluid ounces of water or unsweet tea for healthy kidneys   Limit to max 1 drink of alcohol per day; avoid smoking/tobacco   Limit animal fats in diet for cholesterol and heart health - choose grass fed whenever available   Avoid highly processed foods, and foods high in saturated/trans fats   Aim for low stress - take time to unwind and care for your mental health   Aim for 150 min of moderate intensity exercise weekly for heart health, and weights twice weekly for bone health   Aim for 7-9 hours of sleep daily   When it comes to diets, agreement about the perfect plan isnt easy to find, even among the experts. Experts at the [employer] developed an idea known as the Healthy Eating Plate. Just imagine a plate divided into logical, healthy portions.   The emphasis is on diet quality:   Load up on vegetables and fruits - one-half of your plate: Aim for color and variety, and remember that potatoes dont count.   Go for whole grains - one-quarter of your plate: Whole wheat, barley, wheat berries, quinoa, oats, brown  rice, and foods made with them. If  you want pasta, go with whole wheat pasta.   Protein power - one-quarter of your plate: Fish, chicken, beans, and nuts are all healthy, versatile protein sources. Limit red meat.   The diet, however, does go beyond the plate, offering a few other suggestions.   Use healthy plant oils, such as olive, canola, soy, corn, sunflower and peanut. Check the labels, and avoid partially hydrogenated oil, which have unhealthy trans fats.   If youre thirsty, drink water. Coffee and tea are good in moderation, but skip sugary drinks and limit milk and dairy products to one or two daily servings.   The type of carbohydrate in the diet is more important than the amount. Some sources of carbohydrates, such as vegetables, fruits, whole grains, and beans-are healthier than others.   Finally, stay active  Signed, Berniece Salines, DO  10/29/2020 2:27 PM    [facility]

## 2020-11-04 ENCOUNTER — Other Ambulatory Visit: Payer: Self-pay

## 2020-11-04 DIAGNOSIS — R072 Precordial pain: Secondary | ICD-10-CM

## 2020-11-08 ENCOUNTER — Telehealth (HOSPITAL_COMMUNITY): Payer: Self-pay | Admitting: Emergency Medicine

## 2020-11-08 NOTE — Telephone Encounter (Signed)
Reaching out to patient to offer assistance regarding upcoming cardiac imaging study; pt verbalizes understanding of appt date/time, parking situation and where to check in, pre-test NPO status and medications ordered, and verified current allergies; name and call back number provided for further questions should they arise [place] RN Navigator Cardiac Imaging [facility] [phone] office [phone] cell   Pt reports EXTREMELY CLAUSTROPHOBIC and has FEAR OF NEEDLES, therefore will be pre-medicating with xanax. Wife is driving patient.  [place] Dec

## 2020-11-09 ENCOUNTER — Encounter (HOSPITAL_COMMUNITY): Payer: Self-pay

## 2020-11-09 ENCOUNTER — Ambulatory Visit (HOSPITAL_COMMUNITY)
Admission: RE | Admit: 2020-11-09 | Discharge: 2020-11-09 | Disposition: A | Discharge status: Home / Self Care | Payer: BC Managed Care – PPO | Source: Ambulatory Visit | Attending: Cardiology | Admitting: Cardiology

## 2020-11-09 ENCOUNTER — Other Ambulatory Visit: Payer: Self-pay

## 2020-11-09 DIAGNOSIS — R072 Precordial pain: Secondary | ICD-10-CM | POA: Insufficient documentation

## 2020-11-09 MED ORDER — IOHEXOL 350 MG/ML SOLN
80.0000 mL | Freq: Once | INTRAVENOUS | Status: AC | PRN
  Administered 2020-11-09: 80 mL via INTRAVENOUS

## 2020-11-09 MED ORDER — NITROGLYCERIN 0.4 MG SL SUBL
SUBLINGUAL_TABLET | SUBLINGUAL | Status: AC
  Filled 2020-11-09: qty 2

## 2020-11-09 MED ORDER — NITROGLYCERIN 0.4 MG SL SUBL
0.8000 mg | SUBLINGUAL_TABLET | Freq: Once | SUBLINGUAL | Status: AC
  Administered 2020-11-09: 0.8 mg via SUBLINGUAL

## 2020-11-11 NOTE — Addendum Note (Signed)
Addended by: Reynolds Bowl on: 11/11/2020 08:36 AM   Modules accepted: Orders

## 2020-11-19 ENCOUNTER — Encounter: Payer: Self-pay | Admitting: Gastroenterology

## 2020-12-09 ENCOUNTER — Ambulatory Visit: Payer: BC Managed Care – PPO | Admitting: Gastroenterology

## 2020-12-21 ENCOUNTER — Other Ambulatory Visit: Payer: Self-pay

## 2020-12-21 MED ORDER — HYDROCHLOROTHIAZIDE 12.5 MG PO CAPS: 12.5000 mg | ORAL_CAPSULE | Freq: Every day | ORAL | 1 refills | Status: DC

## 2020-12-21 NOTE — Telephone Encounter (Signed)
HCTZ approved and sent 

## 2021-04-14 ENCOUNTER — Ambulatory Visit: Payer: BC Managed Care – PPO | Admitting: Cardiology

## 2021-05-30 ENCOUNTER — Ambulatory Visit: Payer: BC Managed Care – PPO | Admitting: Cardiology

## 2021-05-31 ENCOUNTER — Other Ambulatory Visit: Payer: Self-pay

## 2021-05-31 MED ORDER — HYDROCHLOROTHIAZIDE 12.5 MG PO CAPS: 12.5000 mg | ORAL_CAPSULE | Freq: Every day | ORAL | 0 refills | Status: DC

## 2021-05-31 NOTE — Telephone Encounter (Signed)
Refill of Hydrochlorothiazide 12.5 mg sent to [facility].

## 2021-06-08 ENCOUNTER — Ambulatory Visit: Payer: BC Managed Care – PPO | Admitting: Cardiology

## 2021-07-06 ENCOUNTER — Other Ambulatory Visit: Payer: Self-pay

## 2021-07-06 MED ORDER — DILTIAZEM HCL ER COATED BEADS 240 MG PO CP24: 240.0000 mg | ORAL_CAPSULE | Freq: Every day | ORAL | 0 refills | Status: DC

## 2021-07-06 NOTE — Telephone Encounter (Signed)
Diltiazem 24 H ER CD 240 mg # 90 capsules only. Patient has appointment with Dr Servando Salina on 07/25/2021. Refill sent to [facility] [place]

## 2021-07-25 ENCOUNTER — Ambulatory Visit: Payer: BC Managed Care – PPO | Admitting: Cardiology

## 2021-07-25 ENCOUNTER — Encounter: Payer: Self-pay | Admitting: Cardiology

## 2021-07-25 ENCOUNTER — Other Ambulatory Visit: Payer: Self-pay

## 2021-07-25 VITALS — BP 120/90 | HR 63 | Ht 70.0 in | Wt 220.0 lb

## 2021-07-25 DIAGNOSIS — I10 Essential (primary) hypertension: Secondary | ICD-10-CM | POA: Diagnosis not present

## 2021-07-25 DIAGNOSIS — I493 Ventricular premature depolarization: Secondary | ICD-10-CM

## 2021-07-25 DIAGNOSIS — E782 Mixed hyperlipidemia: Secondary | ICD-10-CM | POA: Diagnosis not present

## 2021-07-25 DIAGNOSIS — E669 Obesity, unspecified: Secondary | ICD-10-CM

## 2021-07-25 DIAGNOSIS — I471 Supraventricular tachycardia: Secondary | ICD-10-CM | POA: Diagnosis not present

## 2021-07-25 NOTE — Patient Instructions (Signed)
Medication Instructions:  Your physician recommends that you continue on your current medications as directed. Please refer to the Current Medication list given to you today.   *If you need a refill on your cardiac medications before your next appointment, please call your pharmacy*   Lab Work: None If you have labs (blood work) drawn today and your tests are completely normal, you will receive your results only by: MyChart Message (if you have MyChart) OR A paper copy in the mail If you have any lab test that is abnormal or we need to change your treatment, we will call you to review the results.   Testing/Procedures: None   Follow-Up: At [facility], you and your health needs are our priority.  As part of our continuing mission to provide you with exceptional heart care, we have created designated Provider Care Teams.  These Care Teams include your primary Cardiologist (physician) and Advanced Practice Providers (APPs -  Physician Assistants and Nurse Practitioners) who all work together to provide you with the care you need, when you need it.  We recommend signing up for the patient portal called "MyChart".  Sign up information is provided on this After Visit Summary.  MyChart is used to connect with patients for Virtual Visits (Telemedicine).  Patients are able to view lab/test results, encounter notes, upcoming appointments, etc.  Non-urgent messages can be sent to your provider as well.   To learn more about what you can do with MyChart, go to [email or web address].    Your next appointment:   1 year(s)  The format for your next appointment:   In Person  Provider:   Kardie Tobb, DO [address], [place], [place] [address]    Other Instructions   

## 2021-07-25 NOTE — Progress Notes (Signed)
Cardiology Office Note:    Date:  07/25/2021   ID:  Scott Mcguire, DOB 1970/10/19, MRN [id number]  PCP:  Galvin Proffer, MD  Cardiologist:  Thomasene Ripple, DO  Electrophysiologist:  None   Referring MD: Galvin Proffer, MD   " I am still short of breath and work is really stressful"   History of Present Illness:    Scott Mcguire is a 50 y.o. male with a hx of minimal CAD on coronary CTA, hypertension, hypercholesterolemia, symptomatic PVCs, versus major tachycardia is here today for follow-up visit.  I saw the patient October 2021 at that time he was well controlled with his Cardizem we increased the dose to 240 mg to help with his symptoms.   I saw the patient in January 2022 at that time he was experiencing some chest discomfort I sent the patient for coronary CT scan.  He was able to get his CT scan done which showed minimal CAD and this information has been shared with the patient.  He is here today with his wife.  He tells me that he has been experiencing some shortness of breath no chest pain.  But he believes this is also deconditioning.    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    atorvastatin (LIPITOR) 80 MG tablet Take 80 mg by mouth daily.   diltiazem (CARDIZEM CD) 240 MG 24 hr capsule Take 1 capsule (240 mg total) by mouth daily.   hydrochlorothiazide (MICROZIDE) 12.5 MG capsule Take 1 capsule (12.5 mg total) by mouth daily.   metoprolol tartrate (LOPRESSOR) 100 MG tablet Take 2 hours  prior to Cardiac CT   omeprazole (PRILOSEC) 40 MG capsule Take 40 mg by mouth daily.   propranolol (INDERAL) 20 MG tablet Take one tablet by mouth as needed if heart rate greater then 120   [DISCONTINUED] tadalafil (CIALIS) 5 MG tablet Take 5 mg by mouth daily as needed.     Allergies:   Cefuroxime axetil   Social History   Socioeconomic History   Marital status: Unknown    Spouse name: Not on file   Number of children: Not on file   Years of education: Not on file   Highest education level: Not on file  Occupational History   Not on file  Tobacco Use   Smoking status: Never   Smokeless tobacco: Never  Substance and Sexual Activity   Alcohol use: Never   Drug use: Never   Sexual activity: Not on file  Other Topics Concern   Not on file  Social History Narrative   Not on file   Social Determinants of Health   Financial Resource Strain: Not on file  Food Insecurity: Not on file  Transportation Needs: Not on file  Physical Activity: Not on file  Stress: Not on file  Social Connections: Not on file     Family History: The patient's family history includes Heart disease in his father and mother.  ROS:  Review of Systems  Constitution: Negative for decreased appetite, fever and weight gain.  HENT: Negative for congestion, ear discharge, hoarse voice and sore throat.   Eyes: Negative for discharge, redness, vision loss in right eye and visual halos.  Cardiovascular: Negative for chest pain, dyspnea on exertion, leg swelling, orthopnea and palpitations.  Respiratory: Negative for cough, hemoptysis, shortness of breath and snoring.   Endocrine: Negative for heat intolerance and polyphagia.  Hematologic/Lymphatic: Negative for bleeding problem. Does not bruise/bleed easily.  Skin: Negative for flushing, nail changes, rash and suspicious lesions.  Musculoskeletal: Negative for arthritis, joint pain, muscle cramps, myalgias, neck pain and stiffness.  Gastrointestinal:  Negative for abdominal pain, bowel incontinence, diarrhea and excessive appetite.  Genitourinary: Negative for decreased libido, genital sores and incomplete emptying.  Neurological: Negative for brief paralysis, focal weakness, headaches and loss of balance.  Psychiatric/Behavioral: Negative for altered mental status, depression and suicidal ideas.  Allergic/Immunologic: Negative for HIV exposure and persistent infections.    EKGs/Labs/Other Studies Reviewed:    The following studies were reviewed today:   EKG:  The ekg ordered today demonstrates sinus rhythm 63 bpm with incomplete right bundle branch block   Zio medication 2021 The patient wore the monitor for 3 days 1 hour starting 06/26/2020. Indication: Palpitations   The minimum heart rate was 52 bpm, maximum heart rate was 196 bpm, and average heart rate was 83 bpm. Predominant underlying rhythm was Sinus Rhythm.   2 Supraventricular Tachycardia runs occurred, the run with the fastest interval lasting 5 beats with a maximum rate of 171 bpm (average 146 bpm); the run with the fastest interval was also the longest.   Premature atrial complexes were rare.  Premature Ventricular complexes were occasional (1.0%, 3730).   No ventricular tachycardia, no pauses, No AV block and no atrial fibrillation present. 3 patient triggered events all associated with premature ventricular complex. 5 diary events 3 associated with premature ventricular complex and the remaining with sinus rhythm.   Conclusion: This study is remarkable for the following:                             1. Rare asymptomatic atrial tachycardia with variable block.                             2. Occasional premature ventricular complexes.  CCTA  11/09/2020 Aorta: Normal size.  No calcifications.  No dissection.   Aortic Valve:  Trileaflet.  No calcifications.   Coronary calcium score 8.6.   Coronary Arteries:  Normal coronary origin.  Right dominance.   RCA is a  large dominant artery that gives rise to PDA and PLVB. There is no plaque.   Left main is a large artery that gives rise to LAD and LCX arteries.   LAD is a large vessel. There is a minimal calcified plaque in the mid LAD. The proximal and distal LAD.   LCX is a non-dominant artery that gives rise to one large OM1 branch. There is no plaque. There is a stair-step artifact in the proximal LAD.   Other findings:   Normal pulmonary vein drainage into the left atrium.   Normal left atrial appendage without a thrombus.   Normal size of the pulmonary artery.   IMPRESSION: 1. Coronary calcium score of 8.6. This was 9 percentile for age and sex matched control.   2. Normal coronary origin with right  dominance.   3. Minimal CAD. CADRADS 1. Aggressive medical therapy is recommended.   Kalkidan Caudell, DO   Recent Labs: No results found for requested labs within last 8760 hours.  Recent Lipid Panel No results found for: CHOL, TRIG, HDL, CHOLHDL, VLDL, LDLCALC, LDLDIRECT  Physical Exam:    VS:  BP 120/90   Pulse 63   Ht 5\' 10"  (1.778 m)   Wt 220 lb (99.8 kg)   SpO2 97%   BMI 31.57 kg/m     Wt Readings from Last 3 Encounters:  07/25/21 220 lb (99.8 kg)  10/29/20 228 lb 3.2 oz (103.5 kg)  07/23/20 221 lb 12.8 oz (100.6 kg)     GEN: Well nourished, well developed in no acute distress HEENT: Normal NECK: No JVD; No carotid bruits LYMPHATICS: No lymphadenopathy CARDIAC: S1S2 noted,RRR, no murmurs, rubs, gallops RESPIRATORY:  Clear to auscultation without rales, wheezing or rhonchi  ABDOMEN: Soft, non-tender, non-distended, +bowel sounds, no guarding. EXTREMITIES: No edema, No cyanosis, no clubbing MUSCULOSKELETAL:  No deformity  SKIN: Warm and dry NEUROLOGIC:  Alert and oriented x 3, non-focal PSYCHIATRIC:  Normal affect, good insight  ASSESSMENT:    1. PAT (paroxysmal atrial tachycardia) (HCC)   2. Hypertension, unspecified type   3. Symptomatic PVCs   4. Mixed  hyperlipidemia   5. Obesity (BMI 30-39.9)    PLAN:    We talked about his shortness of breath which may be in the setting of deconditioning.  He gets nervous when he exercises or walks a little bit he is heart rate goes up when he feels short of breath.  I have reassured the patient that he should actually continue to walk or exercise.  He is going to start with 10 minutes blocks and then gradually improved.  If he is unable to tolerate this we will discuss other means of exercises which could be supervised in the beginning.  No anginal symptoms.  He is responding well to the Cardizem at 240 we will keep him on this for now. He has not needed the propanolol as needed.  The patient understands the need to lose weight with diet and exercise. We have discussed specific strategies for this.  The patient is in agreement with the above plan. The patient left the office in stable condition.  The patient will follow up in 1 year or sooner if needed.   Medication Adjustments/Labs and Tests Ordered: Current medicines are reviewed at length with the patient today.  Concerns regarding medicines are outlined above.  Orders Placed This Encounter  Procedures   EKG 12-Lead   No orders of the defined types were placed in this encounter.   Patient Instructions  Medication Instructions:  Your physician recommends that you continue on your current medications as directed. Please refer to the Current Medication list given to you today.  *If you need a refill on your cardiac medications before your next appointment, please call your pharmacy*   Lab Work: None If you have labs (blood work) drawn today and your tests are completely normal, you will receive your results only by: MyChart Message (if you have MyChart) OR A paper copy in the mail If you have any lab test that is abnormal or we need to change your treatment, we will call you to review the  results.   Testing/Procedures: None   Follow-Up: At [facility], you and your health needs are our priority.  As part of our continuing mission to provide you with exceptional heart  care, we have created designated Provider Care Teams.  These Care Teams include your primary Cardiologist (physician) and Advanced Practice Providers (APPs -  Physician Assistants and Nurse Practitioners) who all work together to provide you with the care you need, when you need it.  We recommend signing up for the patient portal called "MyChart".  Sign up information is provided on this After Visit Summary.  MyChart is used to connect with patients for Virtual Visits (Telemedicine).  Patients are able to view lab/test results, encounter notes, upcoming appointments, etc.  Non-urgent messages can be sent to your provider as well.   To learn more about what you can do with MyChart, go to [email or web address].    Your next appointment:   1 year(s)  The format for your next appointment:   In Person  Provider:   Thomasene Ripple, DO [address], [place], [place] [address]    Other Instructions     Adopting a Healthy Lifestyle.  Know what a healthy weight is for you (roughly BMI <25) and aim to maintain this   Aim for 7+ servings of fruits and vegetables daily   65-80+ fluid ounces of water or unsweet tea for healthy kidneys   Limit to max 1 drink of alcohol per day; avoid smoking/tobacco   Limit animal fats in diet for cholesterol and heart health - choose grass fed whenever available   Avoid highly processed foods, and foods high in saturated/trans fats   Aim for low stress - take time to unwind and care for your mental health   Aim for 150 min of moderate intensity exercise weekly for heart health, and weights twice weekly for bone health   Aim for 7-9 hours of sleep daily   When it comes to diets, agreement about the perfect plan isnt easy to find, even among the experts. Experts at  the [facility] [employer] developed an idea known as the Healthy Eating Plate. Just imagine a plate divided into logical, healthy portions.   The emphasis is on diet quality:   Load up on vegetables and fruits - one-half of your plate: Aim for color and variety, and remember that potatoes dont count.   Go for whole grains - one-quarter of your plate: Whole wheat, barley, wheat berries, quinoa, oats, brown rice, and foods made with them. If you want pasta, go with whole wheat pasta.   Protein power - one-quarter of your plate: Fish, chicken, beans, and nuts are all healthy, versatile protein sources. Limit red meat.   The diet, however, does go beyond the plate, offering a few other suggestions.   Use healthy plant oils, such as olive, canola, soy, corn, sunflower and peanut. Check the labels, and avoid partially hydrogenated oil, which have unhealthy trans fats.   If youre thirsty, drink water. Coffee and tea are good in moderation, but skip sugary drinks and limit milk and dairy products to one or two daily servings.   The type of carbohydrate in the diet is more important than the amount. Some sources of carbohydrates, such as vegetables, fruits, whole grains, and beans-are healthier than others.   Finally, stay active  Signed, Thomasene Ripple, DO  07/25/2021 5:01 PM    [facility]

## 2021-09-20 ENCOUNTER — Other Ambulatory Visit: Payer: Self-pay

## 2021-09-20 MED ORDER — HYDROCHLOROTHIAZIDE 12.5 MG PO CAPS: 12.5000 mg | ORAL_CAPSULE | Freq: Every day | ORAL | 3 refills | Status: DC

## 2021-10-14 IMAGING — CT CT HEART MORP W/ CTA COR W/ SCORE W/ CA W/CM &/OR W/O CM
4 of 7 series · 8 of 20 positions shown, 9 images · IV contrast (APPLIED)
Comparison: None.
COMPARISON: None.

Addendum:
EXAM:
OVER-READ INTERPRETATION  CT CHEST

The following report is an over-read performed by radiologist Dr.
M Hamza Tiger [REDACTED] on 11/09/2020. This over-read
does not include interpretation of cardiac or coronary anatomy or
pathology. The coronary CTA interpretation by the cardiologist is
attached.
CLINICAL DATA: This is a 49 year old male with chest pain
Cardiac/Coronary  CT
TECHNIQUE: The patient was scanned on a [employer] Force scanner.

[Series 6: best diast 71 % · axial · 0.39mm/px · z∈[+1300,+1342]mm · 2 of 323 slices shown]
[im 108/323  vessel]
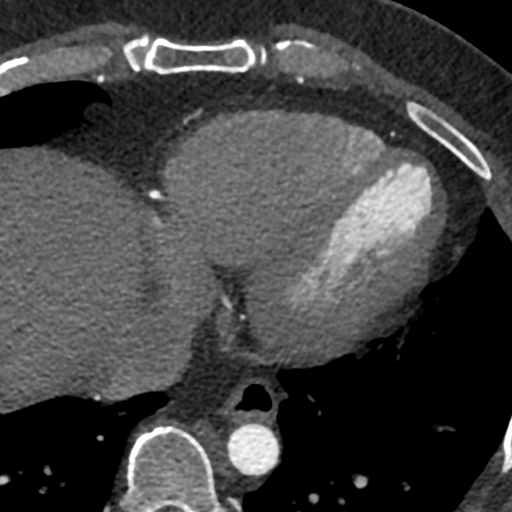
[im 215/323  vessel]
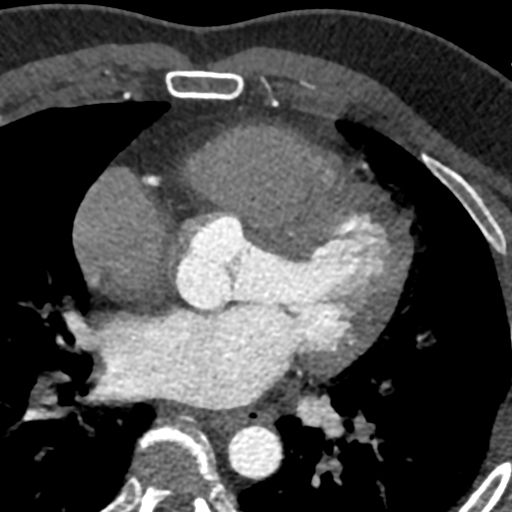

[Series 7: best syst · axial · 0.39mm/px · z∈[+1300,+1342]mm · 2 of 323 slices shown, 3 images]
[im 108/323  vessel]
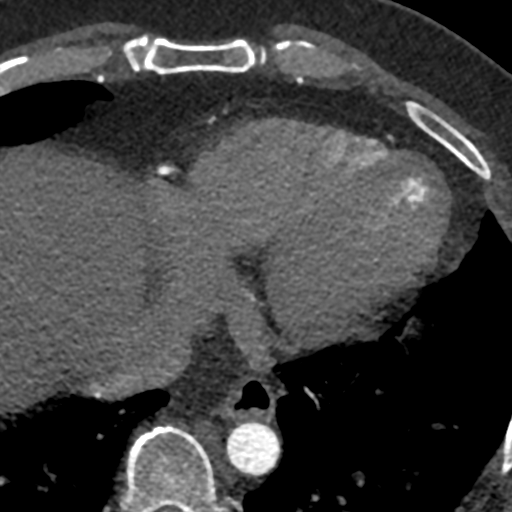
[im 108/323  lung]
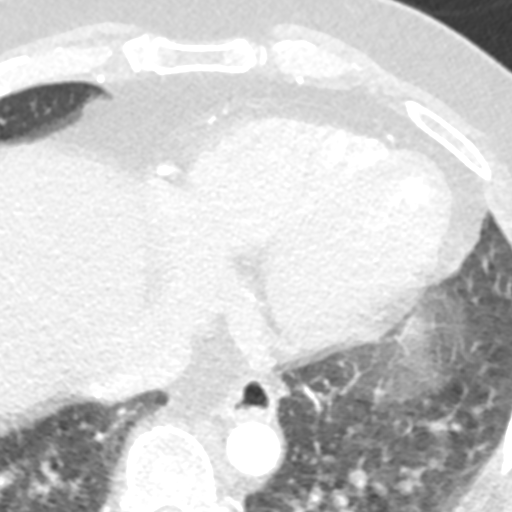
[im 215/323  vessel]
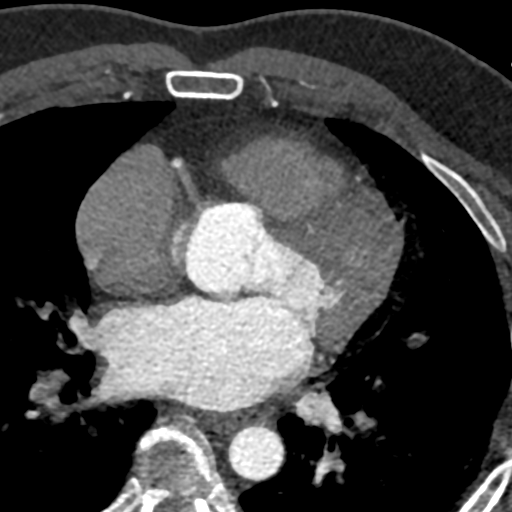

[Series 8: ts diast sharp 71 % · axial · 0.39mm/px · z∈[+1300,+1342]mm · 2 of 323 slices shown]
[im 108/323  lung]
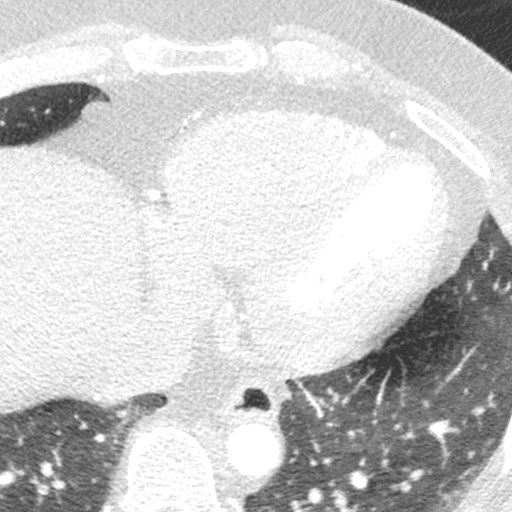
[im 215/323  lung]
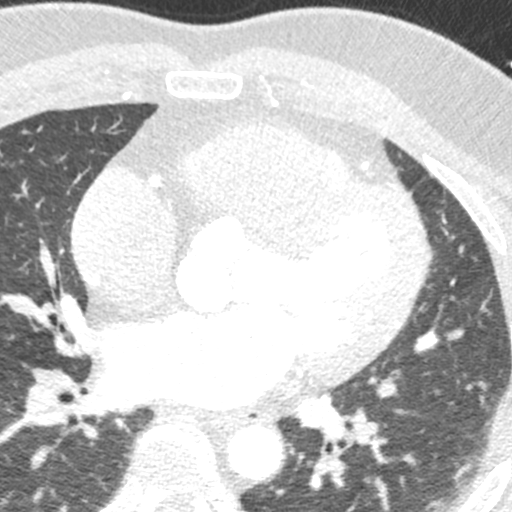

[Series 9: ts syst sharp · axial · 0.39mm/px · z∈[+1300,+1342]mm · 2 of 323 slices shown]
[im 108/323  lung]
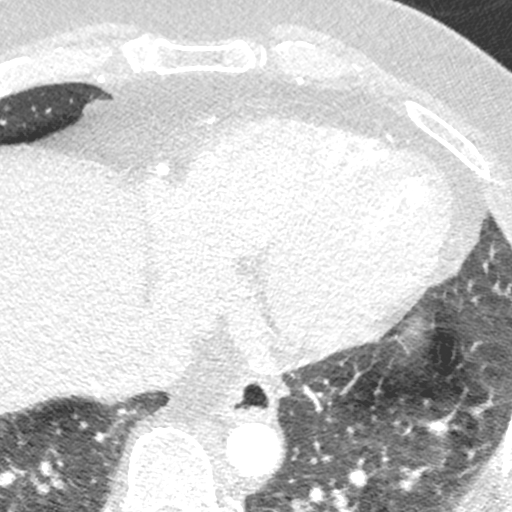
[im 215/323  lung]
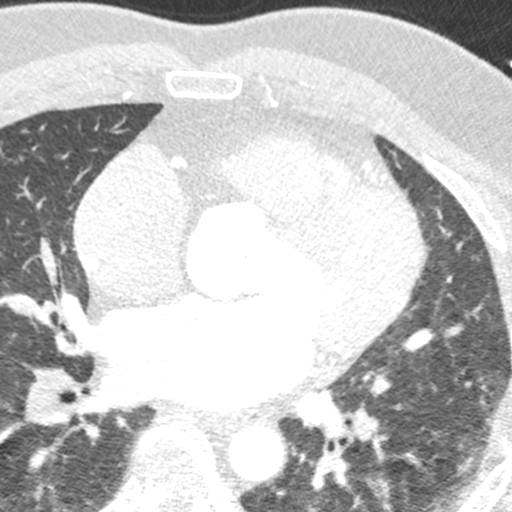

[8 of 20 positions shown; findings below may reference images not displayed]

FINDINGS: Vascular: Heart is normal size.  Aorta normal caliber.

Mediastinum/Nodes: No adenopathy

Lungs/Pleura: Visualized lungs clear.  No effusions.

Upper Abdomen: Imaging into the upper abdomen demonstrates no acute
findings.

Musculoskeletal: Chest wall soft tissues are unremarkable. No acute
bony abnormality.
IMPRESSION: No acute or significant extracardiac abnormality.
FINDINGS: A 120 kV prospective scan was triggered in the descending thoracic
aorta at 111 HU's. Axial non-contrast 3 mm slices were carried out
through the heart. The data set was analyzed on a dedicated work
station and scored using the Agatson method. Gantry rotation speed
was 250 msecs and collimation was .6 mm. No beta blockade and 0.8 mg
of sl NTG was given. The 3D data set was reconstructed in 5%
intervals of the 67-82 % of the R-R cycle. Diastolic phases were
analyzed on a dedicated work station using MPR, MIP and VRT modes.
The patient received 80 cc of contrast.

Aorta: Normal size.  No calcifications.  No dissection.

Aortic Valve:  Trileaflet.  No calcifications.

Coronary calcium score 8.6.

Coronary Arteries:  Normal coronary origin.  Right dominance.

RCA is a large dominant artery that gives rise to PDA and PLVB.
There is no plaque.

Left main is a large artery that gives rise to LAD and LCX arteries.

LAD is a large vessel. There is a minimal calcified plaque in the
mid LAD. The proximal and distal LAD.

LCX is a non-dominant artery that gives rise to one large OM1
branch. There is no plaque. There is a stair-step artifact in the
proximal LAD.

Other findings:

Normal pulmonary vein drainage into the left atrium.

Normal left atrial appendage without a thrombus.

Normal size of the pulmonary artery.
IMPRESSION: 1. Coronary calcium score of 8.6. This was 70 percentile for age and
sex matched control.

2. Normal coronary origin with right dominance.

3. Minimal CAD. CADRADS 1. Aggressive medical therapy is
recommended.

Tuvahanga Senale, DO

*** End of Addendum ***
EXAM:
OVER-READ INTERPRETATION  CT CHEST

The following report is an over-read performed by radiologist Dr.
M Hamza Tiger [REDACTED] on 11/09/2020. This over-read
does not include interpretation of cardiac or coronary anatomy or
pathology. The coronary CTA interpretation by the cardiologist is
attached.
FINDINGS: Vascular: Heart is normal size.  Aorta normal caliber.

Mediastinum/Nodes: No adenopathy

Lungs/Pleura: Visualized lungs clear.  No effusions.

Upper Abdomen: Imaging into the upper abdomen demonstrates no acute
findings.

Musculoskeletal: Chest wall soft tissues are unremarkable. No acute
bony abnormality.
IMPRESSION: No acute or significant extracardiac abnormality.

## 2021-10-19 ENCOUNTER — Other Ambulatory Visit: Payer: Self-pay

## 2021-10-19 MED ORDER — DILTIAZEM HCL ER COATED BEADS 240 MG PO CP24: 240.0000 mg | ORAL_CAPSULE | Freq: Every day | ORAL | 3 refills | Status: DC

## 2021-11-15 DIAGNOSIS — S2020XA Contusion of thorax, unspecified, initial encounter: Secondary | ICD-10-CM | POA: Diagnosis not present

## 2021-12-23 ENCOUNTER — Other Ambulatory Visit: Payer: Self-pay | Admitting: *Deleted

## 2022-02-23 DIAGNOSIS — E782 Mixed hyperlipidemia: Secondary | ICD-10-CM | POA: Diagnosis not present

## 2022-02-23 DIAGNOSIS — Z0000 Encounter for general adult medical examination without abnormal findings: Secondary | ICD-10-CM | POA: Diagnosis not present

## 2022-02-23 DIAGNOSIS — K219 Gastro-esophageal reflux disease without esophagitis: Secondary | ICD-10-CM | POA: Diagnosis not present

## 2022-02-23 DIAGNOSIS — I10 Essential (primary) hypertension: Secondary | ICD-10-CM | POA: Diagnosis not present

## 2022-02-23 DIAGNOSIS — F419 Anxiety disorder, unspecified: Secondary | ICD-10-CM | POA: Diagnosis not present

## 2022-02-23 DIAGNOSIS — E119 Type 2 diabetes mellitus without complications: Secondary | ICD-10-CM | POA: Diagnosis not present

## 2022-02-23 DIAGNOSIS — E038 Other specified hypothyroidism: Secondary | ICD-10-CM | POA: Diagnosis not present

## 2022-02-23 DIAGNOSIS — Z79899 Other long term (current) drug therapy: Secondary | ICD-10-CM | POA: Diagnosis not present

## 2022-08-10 DIAGNOSIS — I10 Essential (primary) hypertension: Secondary | ICD-10-CM | POA: Diagnosis not present

## 2022-08-10 DIAGNOSIS — K219 Gastro-esophageal reflux disease without esophagitis: Secondary | ICD-10-CM | POA: Diagnosis not present

## 2022-08-10 DIAGNOSIS — E782 Mixed hyperlipidemia: Secondary | ICD-10-CM | POA: Diagnosis not present

## 2022-08-10 DIAGNOSIS — E291 Testicular hypofunction: Secondary | ICD-10-CM | POA: Diagnosis not present

## 2022-10-25 ENCOUNTER — Other Ambulatory Visit: Payer: Self-pay

## 2022-10-25 MED ORDER — DILTIAZEM HCL ER COATED BEADS 240 MG PO CP24: 240.0000 mg | ORAL_CAPSULE | Freq: Every day | ORAL | 3 refills | Status: DC

## 2022-10-25 MED ORDER — HYDROCHLOROTHIAZIDE 12.5 MG PO CAPS: 12.5000 mg | ORAL_CAPSULE | Freq: Every day | ORAL | 3 refills | Status: DC

## 2023-01-22 ENCOUNTER — Other Ambulatory Visit: Payer: Self-pay

## 2023-01-22 MED ORDER — HYDROCHLOROTHIAZIDE 12.5 MG PO CAPS: 12.5000 mg | ORAL_CAPSULE | Freq: Every day | ORAL | 0 refills | Status: DC

## 2023-02-05 DIAGNOSIS — Z125 Encounter for screening for malignant neoplasm of prostate: Secondary | ICD-10-CM | POA: Diagnosis not present

## 2023-02-05 DIAGNOSIS — K219 Gastro-esophageal reflux disease without esophagitis: Secondary | ICD-10-CM | POA: Diagnosis not present

## 2023-02-05 DIAGNOSIS — F419 Anxiety disorder, unspecified: Secondary | ICD-10-CM | POA: Diagnosis not present

## 2023-02-05 DIAGNOSIS — Z0000 Encounter for general adult medical examination without abnormal findings: Secondary | ICD-10-CM | POA: Diagnosis not present

## 2023-02-05 DIAGNOSIS — E782 Mixed hyperlipidemia: Secondary | ICD-10-CM | POA: Diagnosis not present

## 2023-02-05 DIAGNOSIS — I10 Essential (primary) hypertension: Secondary | ICD-10-CM | POA: Diagnosis not present

## 2023-02-21 ENCOUNTER — Other Ambulatory Visit: Payer: Self-pay

## 2023-02-21 MED ORDER — HYDROCHLOROTHIAZIDE 12.5 MG PO CAPS: 12.5000 mg | ORAL_CAPSULE | Freq: Every day | ORAL | 0 refills | Status: DC

## 2023-03-15 NOTE — H&P (View-Only) (Signed)
Cardiology Office Note:    Date:  03/16/2023   ID:  Scott Mcguire, DOB 09/08/1971, MRN [id number]  PCP:  Hague, Imran P, MD   [facility] Providers Cardiologist:  Kardie Tobb, DO     Referring MD: Hague, Imran P, MD   CC: chest pain   History of Present Illness:    Scott Mcguire is a 52 y.o. 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  He wore a monitor in September 2021 which revealed an average heart rate of 83 bpm, predominant underlying rhythm was sinus, 2 episodes of SVT, all triggered events were associated with PVCs. Echocardiogram in August 2021 revealed an EF of 55 to 60%, mild concentric LVH, trace MR, mild TR.  Coronary CTA in 2022 the calcium score of 8.6, minimal CAD.  Most recently evaluated with Dr. Tobb in October 2022, at that time he was doing well from a cardiac perspective, was endorsing some shortness of breath but felt it was related to deconditioning.  There was some hesitancy on his part to increase his physical activity due to tachycardia however he was encouraged to continue exercising and if this was more bothersome to advise us.  He presents today accompanied by his wife for follow-up of chest pain.  He endorses a very high level of stress at work, and is not sure if this is contributing to his chest pain.  He explains the chest pain as a squeezing sensation, can occur with stress, can also occur after heavy meal.  Describes as sharp but also squeezing.  Can be relieved with Xanax and propranolol but typically does not make the pain go completely away. He denies  palpitations, dyspnea, pnd, orthopnea, n, v, dizziness, syncope, edema, weight gain, or early satiety.   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    aspirin EC 81 MG tablet Take 81 mg by mouth daily. Swallow whole.   atorvastatin (LIPITOR) 80 MG tablet Take 80 mg by mouth daily.   diltiazem (CARDIZEM CD) 240 MG 24 hr capsule Take 1 capsule (240 mg total) by mouth daily.   ezetimibe (ZETIA) 10 MG tablet Take 1 tablet (10 mg total) by mouth at bedtime.   hydrochlorothiazide (MICROZIDE) 12.5 MG capsule Take 1 capsule (12.5 mg total) by mouth daily.   LORazepam (ATIVAN) 1 MG tablet Take 1 tablet (1 mg total) by mouth once for 1 dose. Prior to CT   metoprolol tartrate (LOPRESSOR) 100 MG tablet Take 1 tablet (100 mg total) by mouth once for 1 dose. Take 2 hours before CT   omeprazole (PRILOSEC) 40 MG capsule Take 40 mg by mouth daily.   propranolol (INDERAL) 20 MG tablet Take one tablet by mouth as needed if heart rate greater then 120     Allergies:   Cefuroxime axetil   Social History   Socioeconomic History   Marital status: Unknown      Spouse name: Not on file   Number of children: Not on file   Years of education: Not on file   Highest education level: Not on file  Occupational History   Not on file  Tobacco Use   Smoking status: Never   Smokeless tobacco: Never  Substance and Sexual Activity   Alcohol use: Never   Drug use: Never   Sexual activity: Not on file  Other Topics Concern   Not on file  Social History Narrative   Not on file   Social Determinants of Health   Financial Resource Strain: Not on file  Food Insecurity: Not on file  Transportation Needs: Not on file  Physical Activity: Not on file  Stress: Not on file  Social Connections: Not  on file     Family History: The patient's family history includes Heart disease in his father and mother.  ROS:   Please see the history of present illness.     All other systems reviewed and are negative.  EKGs/Labs/Other Studies Reviewed:    The following studies were reviewed today:   EKG:  EKG is  ordered today.  The ekg ordered today demonstrates NSR, HR 80 bpm.   Recent Labs: No results found for requested labs within last 365 days.  Recent Lipid Panel No results found for: "CHOL", "TRIG", "HDL", "CHOLHDL", "VLDL", "LDLCALC", "LDLDIRECT"   Risk Assessment/Calculations:                Physical Exam:    VS:  BP 130/80 (BP Location: Right Arm, Patient Position: Sitting, Cuff Size: Normal)   Pulse 80   Ht 5' 10" (1.778 m)   Wt 217 lb (98.4 kg)   SpO2 99%   BMI 31.14 kg/m     Wt Readings from Last 3 Encounters:  03/16/23 217 lb (98.4 kg)  07/25/21 220 lb (99.8 kg)  10/29/20 228 lb 3.2 oz (103.5 kg)     GEN:  Well nourished, well developed in no acute distress HEENT: Normal NECK: No JVD; No carotid bruits LYMPHATICS: No lymphadenopathy CARDIAC: RRR, no murmurs, rubs, gallops RESPIRATORY:  Clear to auscultation without rales, wheezing or rhonchi  ABDOMEN: Soft, non-tender, non-distended MUSCULOSKELETAL:  No edema; No deformity  SKIN: Warm and dry NEUROLOGIC:  Alert and oriented x 3, anxious PSYCHIATRIC:  Normal affect   ASSESSMENT:    1. Precordial pain   2. PAT (paroxysmal atrial tachycardia)   3. Symptomatic PVCs   4. Mixed hyperlipidemia   5. Coronary artery disease involving native coronary artery of native heart without angina pectoris   6. Essential hypertension   7. Anxiety    PLAN:    In order of problems listed above:  Precordial pain/mild nonobstructive CAD-previously had a coronary CTA which revealed a calcium score of 8.6, mild nonobstructive coronary artery disease.  Chest pain has mixed features and likely could be related to  stressors in his life however he does have comorbid conditions including hyperlipidemia, strong family history of early CAD and hypertension.  We discussed the possibility of a stress test however if his heart rate becomes elevated this makes his symptoms worse and he does not think he would be able to exercise on a treadmill or tolerate a stress agent.  We will plan to repeat the coronary CTA.  Due to his nonobstructive CAD, will start aspirin 81 mg daily. Paroxysmal atrial tachycardia/symptomatic PVCs-quiescent.  Continue propranolol 20 mg as needed for heart rate greater than 120. Hyperlipidemia -most recent LDL was   elevated at 152, he is already on max dose of Lipitor.  Will start Zetia 10 mg daily.  Will return in 8 weeks for repeat FLP and LFTs. Heart healthy diet and regular cardiovascular exercise encouraged.   Anxiety-currently takes Xanax as needed for sleep, wife states that he takes most evenings.  He endorses that he is an anxious person.  We did discuss that there is a possibility of depression along with anxiety however he does not feel this is the case.  We did discuss that SSRIs or SNRIs can also help with anxiety.  If his coronary CTA is unrevealing he will likely need to follow-up with his PCP for further management of his stress and anxiety.  Will give him Ativan 1 mg p.o. x 1 to take prior to his coronary CTA.     Disposition-coronary CTA, Ativan 1 mg p.o. x 1 for anxiety prior to CTA, start Zetia 10 mg daily, start baby aspirin 81 mg daily.  Return in 8 weeks for FLP and LFTs.      Medication Adjustments/Labs and Tests Ordered: Current medicines are reviewed at length with the patient today.  Concerns regarding medicines are outlined above.  Orders Placed This Encounter  Procedures   CT CORONARY MORPH W/CTA COR W/SCORE W/CA W/CM &/OR WO/CM   Lipid panel   Hepatic function panel   EKG 12-Lead   Meds ordered this encounter  Medications   metoprolol tartrate (LOPRESSOR) 100 MG  tablet    Sig: Take 1 tablet (100 mg total) by mouth once for 1 dose. Take 2 hours before CT    Dispense:  1 tablet    Refill:  0   LORazepam (ATIVAN) 1 MG tablet    Sig: Take 1 tablet (1 mg total) by mouth once for 1 dose. Prior to CT    Dispense:  1 tablet    Refill:  0   ezetimibe (ZETIA) 10 MG tablet    Sig: Take 1 tablet (10 mg total) by mouth at bedtime.    Dispense:  90 tablet    Refill:  3    Patient Instructions  Medication Instructions:  Your physician has recommended you make the following change in your medication:  Start Zetia daily at bedtime TAke Ativan before CT for anxiety Start Aspirin 81 mg once daily Take Metoprolol 100 mg 2 hours prior to CT   *If you need a refill on your cardiac medications before your next appointment, please call your pharmacy*   Lab Work: Your physician recommends that you return for lab work in: 8 Weeks for Fasting Lipid panel and Liver Function Lab opens at 8am. You DO NOT NEED an appointment. Best time to come is between 8am and 12noon and between 1:30 and 4:30. If you have been asked to fast for your blood work please have nothing to eat or drink after midnight. You may have water.   If you have labs (blood work) drawn today and your tests are completely normal, you will receive your results only by: MyChart Message (if you have MyChart) OR A paper copy in the mail If you have any lab test that is abnormal or we need to change your treatment, we will call you to review the results.   Testing/Procedures: Please arrive at the [facility] main entrance of [facility] at xx:xx AM (30-45 minutes prior to test start time)  [facility] [address] [place], [place] [address] [phone]  Proceed to the [facility] Department (First Floor).  Please follow these instructions carefully (unless otherwise directed):  Hold all erectile dysfunction medications at least 48 hours prior to test.  On the  Night Before the Test: Drink plenty of water. Do not consume any caffeinated/decaffeinated beverages or chocolate 12 hours prior to your test. Do not take any antihistamines 12 hours prior to your test. On the Day of the Test: Drink plenty of water. Do not drink any water within one hour of the test. Do not eat any food 4 hours prior to the test. You may take your regular medications prior to the test. IF NOT ON A BETA BLOCKER - Take 100 mg of lopressor (metoprolol) two hours  before the test.  After the Test: Drink plenty of water. After receiving IV contrast, you may experience a mild flushed feeling. This is normal. On occasion, you may experience a mild rash up to 24 hours after the test. This is not dangerous. If this occurs, you can take Benadryl 25 mg and increase your fluid intake. If you experience trouble breathing, this can be serious. If it is severe call 911 IMMEDIATELY. If it is mild, please call our office.   Follow-Up: At  [facility], you and your health needs are our priority.  As part of our continuing mission to provide you with exceptional heart care, we have created designated Provider Care Teams.  These Care Teams include your primary Cardiologist (physician) and Advanced Practice Providers (APPs -  Physician Assistants and Nurse Practitioners) who all work together to provide you with the care you need, when you need it.  We recommend signing up for the patient portal called "MyChart".  Sign up information is provided on this After Visit Summary.  MyChart is used to connect with patients for Virtual Visits (Telemedicine).  Patients are able to view lab/test results, encounter notes, upcoming appointments, etc.  Non-urgent messages can be sent to your provider as well.   To learn more about what you can do with MyChart, go to [email or web address].    Your next appointment:   2 month(s)  Provider:   Brian Munley, MD    Other Instructions Cardiac CT  Angiogram A cardiac CT angiogram is a procedure to look at the heart and the area around the heart. It may be done to help find the cause of chest pains or other symptoms of heart disease. During this procedure, a substance called contrast dye is injected into a vein in the arm. The contrast highlights the blood vessels in the area to be checked. A large X-ray machine (CT scanner), then takes detailed pictures of the heart and the surrounding area. The procedure is also sometimes called a coronary CT angiogram, coronary artery scanning, or CTA. A cardiac CT angiogram allows the health care provider to see how well blood is flowing to and from the heart. The provider will be able to see if there are any problems, such as: Blockage or narrowing of the arteries in the heart. Fluid around the heart. Signs of weakness or disease in the muscles, valves, and tissues of the heart. Tell a health care provider about: Any allergies you have. This is especially important if you have had a previous allergic reaction to medicines, contrast dye, or iodine. All medicines you are taking, including vitamins, herbs, eye drops, creams, and over-the-counter medicines. Any bleeding problems you have. Any surgeries you have had. Any medical conditions you have, including kidney problems or kidney failure. Whether you are   pregnant or may be pregnant. Any anxiety disorders, chronic pain, or other conditions you have. These may increase your stress or prevent you from lying still. Any history of abnormal heart rhythms or heart procedures. What are the risks? Your provider will talk with you about risks. These may include: Bleeding. Infection. Allergic reactions to medicines or dyes. Damage to other structures or organs. Kidney damage from the contrast dye. Increased risk of cancer from radiation exposure. This risk is low. Talk with your provider about: The risks and benefits of testing. How you can receive the lowest  dose of radiation. What happens before the procedure? Wear comfortable clothing and remove any jewelry, glasses, dentures, and hearing aids. Follow instructions from your provider about eating and drinking. These may include: 12 hours before the procedure Avoid caffeine. This includes tea, coffee, soda, energy drinks, and diet pills. Drink plenty of water or other fluids that do not have caffeine in them. Being well hydrated can prevent complications. 4-6 hours before the procedure Stop eating and drinking. This will reduce the risk of nausea from the contrast dye. Ask your provider about changing or stopping your regular medicines. These include: Diabetes medicines. Medicines to treat problems with erections (erectile dysfunction). If you have kidney problems, you may need to receive IV hydration before and after the test. What happens during the procedure?  Hair on your chest may need to be removed so that small sticky patches called electrodes can be placed on your chest. These will transmit information that helps to monitor your heart during the procedure. An IV will be inserted into one of your veins. You might be given a medicine to control your heart rate during the procedure. This will help to ensure that good images are obtained. You will be asked to lie on an exam table. This table will slide in and out of the CT machine during the procedure. Contrast dye will be injected into the IV. You might feel warm, or you may get a metallic taste in your mouth. You may be given medicines to relax or dilate the arteries in your heart. If you are allergic to contrast dyes or iodine you may be given medicine before the test to reduce the risk of an allergic reaction. The table that you are lying on will move into the CT machine tunnel for the scan. The person running the machine will give you instructions while the scans are being done. You may be asked to: Keep your arms above your head. Hold  your breath for short periods. Stay very still, even if the table is moving. The procedure may vary among providers and hospitals. What can I expect after the procedure? After your procedure, it is common to have: A metallic taste in your mouth from the contrast dye. A feeling of warmth. A headache from the heart medicine. Follow these instructions at home: Take over-the-counter and prescription medicines only as told by your provider. If you are told, drink enough fluid to keep your pee pale yellow. This will help to flush the contrast dye out of your body. Most people can return to their normal activities right after the procedure. Ask your provider what activities are safe for you. It is up to you to get the results of your procedure. Ask your provider, or the department that is doing the procedure, when your results will be ready. Contact a health care provider if: You have any symptoms of allergy to the contrast dye. These include: Shortness of   breath. Rash or hives. A racing heartbeat. You notice a change in your peeing (urination). This information is not intended to replace advice given to you by your health care provider. Make sure you discuss any questions you have with your health care provider. Document Revised: 04/21/2022 Document Reviewed: 04/21/2022 Elsevier Patient Education  2024 [employer]     Signed, Amere Bricco C Lorrena Goranson, NP  03/16/2023 10:52 AM    [facility] 

## 2023-03-15 NOTE — Progress Notes (Signed)
Cardiology Office Note:    Date:  03/16/2023   ID:  Scott Mcguire, DOB 1971/08/20, MRN [id number]  PCP:  Galvin Proffer, MD   [facility] Providers Cardiologist:  Thomasene Ripple, DO     Referring MD: Galvin Proffer, MD   CC: chest pain   History of Present Illness:    Scott Mcguire is a 52 y.o. 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  He wore a monitor in September 2021 which revealed an average heart rate of 83 bpm, predominant underlying rhythm was sinus, 2 episodes of SVT, all triggered events were associated with PVCs. Echocardiogram in August 2021 revealed an EF of 55 to 60%, mild concentric LVH, trace MR, mild TR.  Coronary CTA in 2022 the calcium score of 8.6, minimal CAD.  Most recently evaluated with Dr. Servando Salina in October 2022, at that time he was doing well from a cardiac perspective, was endorsing some shortness of breath but felt it was related to deconditioning.  There was some hesitancy on his part to increase his physical activity due to tachycardia however he was encouraged to continue exercising and if this was more bothersome to advise [place].  He presents today accompanied by his wife for follow-up of chest pain.  He endorses a very high level of stress at work, and is not sure if this is contributing to his chest pain.  He explains the chest pain as a squeezing sensation, can occur with stress, can also occur after heavy meal.  Describes as sharp but also squeezing.  Can be relieved with Xanax and propranolol but typically does not make the pain go completely away. He denies  palpitations, dyspnea, pnd, orthopnea, n, v, dizziness, syncope, edema, weight gain, or early satiety.   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    aspirin EC 81 MG tablet Take 81 mg by mouth daily. Swallow whole.   atorvastatin (LIPITOR) 80 MG tablet Take 80 mg by mouth daily.   diltiazem (CARDIZEM CD) 240 MG 24 hr capsule Take 1 capsule (240 mg total) by mouth daily.   ezetimibe (ZETIA) 10 MG tablet Take 1 tablet (10 mg total) by mouth at bedtime.   hydrochlorothiazide (MICROZIDE) 12.5 MG capsule Take 1 capsule (12.5 mg total) by mouth daily.   LORazepam (ATIVAN) 1 MG tablet Take 1 tablet (1 mg total) by mouth once for 1 dose. Prior to CT   metoprolol tartrate (LOPRESSOR) 100 MG tablet Take 1 tablet (100 mg total) by mouth once for 1 dose. Take 2 hours before CT   omeprazole (PRILOSEC) 40 MG capsule Take 40 mg by mouth daily.   propranolol (INDERAL) 20 MG tablet Take one tablet by mouth as needed if heart rate greater then 120     Allergies:   Cefuroxime axetil   Social History   Socioeconomic History   Marital status: Unknown  Spouse name: Not on file   Number of children: Not on file   Years of education: Not on file   Highest education level: Not on file  Occupational History   Not on file  Tobacco Use   Smoking status: Never   Smokeless tobacco: Never  Substance and Sexual Activity   Alcohol use: Never   Drug use: Never   Sexual activity: Not on file  Other Topics Concern   Not on file  Social History Narrative   Not on file   Social Determinants of Health   Financial Resource Strain: Not on file  Food Insecurity: Not on file  Transportation Needs: Not on file  Physical Activity: Not on file  Stress: Not on file  Social Connections: Not  on file     Family History: The patient's family history includes Heart disease in his father and mother.  ROS:   Please see the history of present illness.     All other systems reviewed and are negative.  EKGs/Labs/Other Studies Reviewed:    The following studies were reviewed today:   EKG:  EKG is  ordered today.  The ekg ordered today demonstrates NSR, HR 80 bpm.   Recent Labs: No results found for requested labs within last 365 days.  Recent Lipid Panel No results found for: "CHOL", "TRIG", "HDL", "CHOLHDL", "VLDL", "LDLCALC", "LDLDIRECT"   Risk Assessment/Calculations:                Physical Exam:    VS:  BP 130/80 (BP Location: Right Arm, Patient Position: Sitting, Cuff Size: Normal)   Pulse 80   Ht 5\' 10"  (1.778 m)   Wt 217 lb (98.4 kg)   SpO2 99%   BMI 31.14 kg/m     Wt Readings from Last 3 Encounters:  03/16/23 217 lb (98.4 kg)  07/25/21 220 lb (99.8 kg)  10/29/20 228 lb 3.2 oz (103.5 kg)     GEN:  Well nourished, well developed in no acute distress HEENT: Normal NECK: No JVD; No carotid bruits LYMPHATICS: No lymphadenopathy CARDIAC: RRR, no murmurs, rubs, gallops RESPIRATORY:  Clear to auscultation without rales, wheezing or rhonchi  ABDOMEN: Soft, non-tender, non-distended MUSCULOSKELETAL:  No edema; No deformity  SKIN: Warm and dry NEUROLOGIC:  Alert and oriented x 3, anxious PSYCHIATRIC:  Normal affect   ASSESSMENT:    1. Precordial pain   2. PAT (paroxysmal atrial tachycardia)   3. Symptomatic PVCs   4. Mixed hyperlipidemia   5. Coronary artery disease involving native coronary artery of native heart without angina pectoris   6. Essential hypertension   7. Anxiety    PLAN:    In order of problems listed above:  Precordial pain/mild nonobstructive CAD-previously had a coronary CTA which revealed a calcium score of 8.6, mild nonobstructive coronary artery disease.  Chest pain has mixed features and likely could be related to  stressors in his life however he does have comorbid conditions including hyperlipidemia, strong family history of early CAD and hypertension.  We discussed the possibility of a stress test however if his heart rate becomes elevated this makes his symptoms worse and he does not think he would be able to exercise on a treadmill or tolerate a stress agent.  We will plan to repeat the coronary CTA.  Due to his nonobstructive CAD, will start aspirin 81 mg daily. Paroxysmal atrial tachycardia/symptomatic PVCs-quiescent.  Continue propranolol 20 mg as needed for heart rate greater than 120. Hyperlipidemia -most recent LDL was  elevated at 152, he is already on max dose of Lipitor.  Will start Zetia 10 mg daily.  Will return in 8 weeks for repeat FLP and LFTs. Heart healthy diet and regular cardiovascular exercise encouraged.   Anxiety-currently takes Xanax as needed for sleep, wife states that he takes most evenings.  He endorses that he is an anxious person.  We did discuss that there is a possibility of depression along with anxiety however he does not feel this is the case.  We did discuss that SSRIs or SNRIs can also help with anxiety.  If his coronary CTA is unrevealing he will likely need to follow-up with his PCP for further management of his stress and anxiety.  Will give him Ativan 1 mg p.o. x 1 to take prior to his coronary CTA.     Disposition-coronary CTA, Ativan 1 mg p.o. x 1 for anxiety prior to CTA, start Zetia 10 mg daily, start baby aspirin 81 mg daily.  Return in 8 weeks for FLP and LFTs.      Medication Adjustments/Labs and Tests Ordered: Current medicines are reviewed at length with the patient today.  Concerns regarding medicines are outlined above.  Orders Placed This Encounter  Procedures   CT CORONARY MORPH W/CTA COR W/SCORE W/CA W/CM &/OR WO/CM   Lipid panel   Hepatic function panel   EKG 12-Lead   Meds ordered this encounter  Medications   metoprolol tartrate (LOPRESSOR) 100 MG  tablet    Sig: Take 1 tablet (100 mg total) by mouth once for 1 dose. Take 2 hours before CT    Dispense:  1 tablet    Refill:  0   LORazepam (ATIVAN) 1 MG tablet    Sig: Take 1 tablet (1 mg total) by mouth once for 1 dose. Prior to CT    Dispense:  1 tablet    Refill:  0   ezetimibe (ZETIA) 10 MG tablet    Sig: Take 1 tablet (10 mg total) by mouth at bedtime.    Dispense:  90 tablet    Refill:  3    Patient Instructions  Medication Instructions:  Your physician has recommended you make the following change in your medication:  Start Zetia daily at bedtime TAke Ativan before CT for anxiety Start Aspirin 81 mg once daily Take Metoprolol 100 mg 2 hours prior to CT   *If you need a refill on your cardiac medications before your next appointment, please call your pharmacy*   Lab Work: Your physician recommends that you return for lab work in: 8 Weeks for Fasting Lipid panel and Liver Function Lab opens at 8am. You DO NOT NEED an appointment. Best time to come is between 8am and 12noon and between 1:30 and 4:30. If you have been asked to fast for your blood work please have nothing to eat or drink after midnight. You may have water.   If you have labs (blood work) drawn today and your tests are completely normal, you will receive your results only by: MyChart Message (if you have MyChart) OR A paper copy in the mail If you have any lab test that is abnormal or we need to change your treatment, we will call you to review the results.   Testing/Procedures: Please arrive at the [facility] main entrance of [facility] at xx:xx AM (30-45 minutes prior to test start time)  [facility] [address] [place], [place] [address] [phone]  Proceed to the [facility]  [facility] Radiology Department (First Floor).  Please follow these instructions carefully (unless otherwise directed):  Hold all erectile dysfunction medications at least 48 hours prior to test.  On the  Night Before the Test: Drink plenty of water. Do not consume any caffeinated/decaffeinated beverages or chocolate 12 hours prior to your test. Do not take any antihistamines 12 hours prior to your test. On the Day of the Test: Drink plenty of water. Do not drink any water within one hour of the test. Do not eat any food 4 hours prior to the test. You may take your regular medications prior to the test. IF NOT ON A BETA BLOCKER - Take 100 mg of lopressor (metoprolol) two hours  before the test.  After the Test: Drink plenty of water. After receiving IV contrast, you may experience a mild flushed feeling. This is normal. On occasion, you may experience a mild rash up to 24 hours after the test. This is not dangerous. If this occurs, you can take Benadryl 25 mg and increase your fluid intake. If you experience trouble breathing, this can be serious. If it is severe call 911 IMMEDIATELY. If it is mild, please call our office.   Follow-Up: At [facility], you and your health needs are our priority.  As part of our continuing mission to provide you with exceptional heart care, we have created designated Provider Care Teams.  These Care Teams include your primary Cardiologist (physician) and Advanced Practice Providers (APPs -  Physician Assistants and Nurse Practitioners) who all work together to provide you with the care you need, when you need it.  We recommend signing up for the patient portal called "MyChart".  Sign up information is provided on this After Visit Summary.  MyChart is used to connect with patients for Virtual Visits (Telemedicine).  Patients are able to view lab/test results, encounter notes, upcoming appointments, etc.  Non-urgent messages can be sent to your provider as well.   To learn more about what you can do with MyChart, go to [email or web address].    Your next appointment:   2 month(s)  Provider:   Norman Herrlich, MD    Other Instructions Cardiac CT  Angiogram A cardiac CT angiogram is a procedure to look at the heart and the area around the heart. It may be done to help find the cause of chest pains or other symptoms of heart disease. During this procedure, a substance called contrast dye is injected into a vein in the arm. The contrast highlights the blood vessels in the area to be checked. A large X-ray machine (CT scanner), then takes detailed pictures of the heart and the surrounding area. The procedure is also sometimes called a coronary CT angiogram, coronary artery scanning, or CTA. A cardiac CT angiogram allows the health care provider to see how well blood is flowing to and from the heart. The provider will be able to see if there are any problems, such as: Blockage or narrowing of the arteries in the heart. Fluid around the heart. Signs of weakness or disease in the muscles, valves, and tissues of the heart. Tell a health care provider about: Any allergies you have. This is especially important if you have had a previous allergic reaction to medicines, contrast dye, or iodine. All medicines you are taking, including vitamins, herbs, eye drops, creams, and over-the-counter medicines. Any bleeding problems you have. Any surgeries you have had. Any medical conditions you have, including kidney problems or kidney failure. Whether you are  pregnant or may be pregnant. Any anxiety disorders, chronic pain, or other conditions you have. These may increase your stress or prevent you from lying still. Any history of abnormal heart rhythms or heart procedures. What are the risks? Your provider will talk with you about risks. These may include: Bleeding. Infection. Allergic reactions to medicines or dyes. Damage to other structures or organs. Kidney damage from the contrast dye. Increased risk of cancer from radiation exposure. This risk is low. Talk with your provider about: The risks and benefits of testing. How you can receive the lowest  dose of radiation. What happens before the procedure? Wear comfortable clothing and remove any jewelry, glasses, dentures, and hearing aids. Follow instructions from your provider about eating and drinking. These may include: 12 hours before the procedure Avoid caffeine. This includes tea, coffee, soda, energy drinks, and diet pills. Drink plenty of water or other fluids that do not have caffeine in them. Being well hydrated can prevent complications. 4-6 hours before the procedure Stop eating and drinking. This will reduce the risk of nausea from the contrast dye. Ask your provider about changing or stopping your regular medicines. These include: Diabetes medicines. Medicines to treat problems with erections (erectile dysfunction). If you have kidney problems, you may need to receive IV hydration before and after the test. What happens during the procedure?  Hair on your chest may need to be removed so that small sticky patches called electrodes can be placed on your chest. These will transmit information that helps to monitor your heart during the procedure. An IV will be inserted into one of your veins. You might be given a medicine to control your heart rate during the procedure. This will help to ensure that good images are obtained. You will be asked to lie on an exam table. This table will slide in and out of the CT machine during the procedure. Contrast dye will be injected into the IV. You might feel warm, or you may get a metallic taste in your mouth. You may be given medicines to relax or dilate the arteries in your heart. If you are allergic to contrast dyes or iodine you may be given medicine before the test to reduce the risk of an allergic reaction. The table that you are lying on will move into the CT machine tunnel for the scan. The person running the machine will give you instructions while the scans are being done. You may be asked to: Keep your arms above your head. Hold  your breath for short periods. Stay very still, even if the table is moving. The procedure may vary among providers and hospitals. What can I expect after the procedure? After your procedure, it is common to have: A metallic taste in your mouth from the contrast dye. A feeling of warmth. A headache from the heart medicine. Follow these instructions at home: Take over-the-counter and prescription medicines only as told by your provider. If you are told, drink enough fluid to keep your pee pale yellow. This will help to flush the contrast dye out of your body. Most people can return to their normal activities right after the procedure. Ask your provider what activities are safe for you. It is up to you to get the results of your procedure. Ask your provider, or the department that is doing the procedure, when your results will be ready. Contact a health care provider if: You have any symptoms of allergy to the contrast dye. These include: Shortness of  breath. Rash or hives. A racing heartbeat. You notice a change in your peeing (urination). This information is not intended to replace advice given to you by your health care provider. Make sure you discuss any questions you have with your health care provider. Document Revised: 04/21/2022 Document Reviewed: 04/21/2022 Elsevier Patient Education  [address]     Signed, Flossie Dibble, NP  03/16/2023 10:52 AM    [facility]

## 2023-03-16 ENCOUNTER — Ambulatory Visit: Payer: BC Managed Care – PPO | Attending: Cardiology | Admitting: Cardiology

## 2023-03-16 ENCOUNTER — Encounter: Payer: Self-pay | Admitting: Cardiology

## 2023-03-16 VITALS — BP 130/80 | HR 80 | Ht 70.0 in | Wt 217.0 lb

## 2023-03-16 DIAGNOSIS — I493 Ventricular premature depolarization: Secondary | ICD-10-CM

## 2023-03-16 DIAGNOSIS — I4719 Other supraventricular tachycardia: Secondary | ICD-10-CM

## 2023-03-16 DIAGNOSIS — R072 Precordial pain: Secondary | ICD-10-CM | POA: Diagnosis not present

## 2023-03-16 DIAGNOSIS — F419 Anxiety disorder, unspecified: Secondary | ICD-10-CM

## 2023-03-16 DIAGNOSIS — E782 Mixed hyperlipidemia: Secondary | ICD-10-CM

## 2023-03-16 DIAGNOSIS — I10 Essential (primary) hypertension: Secondary | ICD-10-CM

## 2023-03-16 DIAGNOSIS — I2510 Atherosclerotic heart disease of native coronary artery without angina pectoris: Secondary | ICD-10-CM

## 2023-03-16 MED ORDER — EZETIMIBE 10 MG PO TABS: 10.0000 mg | ORAL_TABLET | Freq: Every day | ORAL | 3 refills | Status: DC

## 2023-03-16 MED ORDER — LORAZEPAM 1 MG PO TABS: 1.0000 mg | ORAL_TABLET | Freq: Once | ORAL | 0 refills | Status: AC

## 2023-03-16 MED ORDER — METOPROLOL TARTRATE 100 MG PO TABS: 100.0000 mg | ORAL_TABLET | Freq: Once | ORAL | 0 refills | Status: DC

## 2023-03-16 NOTE — Patient Instructions (Signed)
Medication Instructions:  Your physician has recommended you make the following change in your medication:  Start Zetia daily at bedtime TAke Ativan before CT for anxiety Start Aspirin 81 mg once daily Take Metoprolol 100 mg 2 hours prior to CT   *If you need a refill on your cardiac medications before your next appointment, please call your pharmacy*   Lab Work: Your physician recommends that you return for lab work in: 8 Weeks for Fasting Lipid panel and Liver Function Lab opens at 8am. You DO NOT NEED an appointment. Best time to come is between 8am and 12noon and between 1:30 and 4:30. If you have been asked to fast for your blood work please have nothing to eat or drink after midnight. You may have water.   If you have labs (blood work) drawn today and your tests are completely normal, you will receive your results only by: MyChart Message (if you have MyChart) OR A paper copy in the mail If you have any lab test that is abnormal or we need to change your treatment, we will call you to review the results.   Testing/Procedures: Please arrive at the [facility] main entrance of [facility] at xx:xx AM (30-45 minutes prior to test start time)  [facility] [address] [place], [place] [address] [phone]  Proceed to the [facility] Radiology Department (First Floor).  Please follow these instructions carefully (unless otherwise directed):  Hold all erectile dysfunction medications at least 48 hours prior to test.  On the Night Before the Test: Drink plenty of water. Do not consume any caffeinated/decaffeinated beverages or chocolate 12 hours prior to your test. Do not take any antihistamines 12 hours prior to your test. On the Day of the Test: Drink plenty of water. Do not drink any water within one hour of the test. Do not eat any food 4 hours prior to the test. You may take your regular medications prior to the test. IF NOT ON A BETA BLOCKER -  Take 100 mg of lopressor (metoprolol) two hours  before the test.  After the Test: Drink plenty of water. After receiving IV contrast, you may experience a mild flushed feeling. This is normal. On occasion, you may experience a mild rash up to 24 hours after the test. This is not dangerous. If this occurs, you can take Benadryl 25 mg and increase your fluid intake. If you experience trouble breathing, this can be serious. If it is severe call 911 IMMEDIATELY. If it is mild, please call our office.   Follow-Up: At [facility], you and your health needs are our priority.  As part of our continuing mission to provide you with exceptional heart care, we have created designated Provider Care Teams.  These Care Teams include your primary Cardiologist (physician) and Advanced Practice Providers (APPs -  Physician Assistants and Nurse Practitioners) who all work together to provide you with the care you need, when you need it.  We recommend signing up for the patient portal called "MyChart".  Sign up information is provided on this After Visit Summary.  MyChart is used to connect with patients for Virtual Visits (Telemedicine).  Patients are able to view lab/test results, encounter notes, upcoming appointments, etc.  Non-urgent messages can be sent to your provider as well.   To learn more about what you can do with MyChart, go to [email or web address].    Your next appointment:   2 month(s)  Provider:   Arlys John  Dulce Sellar, MD    Other Instructions Cardiac CT Angiogram A cardiac CT angiogram is a procedure to look at the heart and the area around the heart. It may be done to help find the cause of chest pains or other symptoms of heart disease. During this procedure, a substance called contrast dye is injected into a vein in the arm. The contrast highlights the blood vessels in the area to be checked. A large X-ray machine (CT scanner), then takes detailed pictures of the heart and the  surrounding area. The procedure is also sometimes called a coronary CT angiogram, coronary artery scanning, or CTA. A cardiac CT angiogram allows the health care provider to see how well blood is flowing to and from the heart. The provider will be able to see if there are any problems, such as: Blockage or narrowing of the arteries in the heart. Fluid around the heart. Signs of weakness or disease in the muscles, valves, and tissues of the heart. Tell a health care provider about: Any allergies you have. This is especially important if you have had a previous allergic reaction to medicines, contrast dye, or iodine. All medicines you are taking, including vitamins, herbs, eye drops, creams, and over-the-counter medicines. Any bleeding problems you have. Any surgeries you have had. Any medical conditions you have, including kidney problems or kidney failure. Whether you are pregnant or may be pregnant. Any anxiety disorders, chronic pain, or other conditions you have. These may increase your stress or prevent you from lying still. Any history of abnormal heart rhythms or heart procedures. What are the risks? Your provider will talk with you about risks. These may include: Bleeding. Infection. Allergic reactions to medicines or dyes. Damage to other structures or organs. Kidney damage from the contrast dye. Increased risk of cancer from radiation exposure. This risk is low. Talk with your provider about: The risks and benefits of testing. How you can receive the lowest dose of radiation. What happens before the procedure? Wear comfortable clothing and remove any jewelry, glasses, dentures, and hearing aids. Follow instructions from your provider about eating and drinking. These may include: 12 hours before the procedure Avoid caffeine. This includes tea, coffee, soda, energy drinks, and diet pills. Drink plenty of water or other fluids that do not have caffeine in them. Being well hydrated  can prevent complications. 4-6 hours before the procedure Stop eating and drinking. This will reduce the risk of nausea from the contrast dye. Ask your provider about changing or stopping your regular medicines. These include: Diabetes medicines. Medicines to treat problems with erections (erectile dysfunction). If you have kidney problems, you may need to receive IV hydration before and after the test. What happens during the procedure?  Hair on your chest may need to be removed so that small sticky patches called electrodes can be placed on your chest. These will transmit information that helps to monitor your heart during the procedure. An IV will be inserted into one of your veins. You might be given a medicine to control your heart rate during the procedure. This will help to ensure that good images are obtained. You will be asked to lie on an exam table. This table will slide in and out of the CT machine during the procedure. Contrast dye will be injected into the IV. You might feel warm, or you may get a metallic taste in your mouth. You may be given medicines to relax or dilate the arteries in your heart. If you are  allergic to contrast dyes or iodine you may be given medicine before the test to reduce the risk of an allergic reaction. The table that you are lying on will move into the CT machine tunnel for the scan. The person running the machine will give you instructions while the scans are being done. You may be asked to: Keep your arms above your head. Hold your breath for short periods. Stay very still, even if the table is moving. The procedure may vary among providers and hospitals. What can I expect after the procedure? After your procedure, it is common to have: A metallic taste in your mouth from the contrast dye. A feeling of warmth. A headache from the heart medicine. Follow these instructions at home: Take over-the-counter and prescription medicines only as told by  your provider. If you are told, drink enough fluid to keep your pee pale yellow. This will help to flush the contrast dye out of your body. Most people can return to their normal activities right after the procedure. Ask your provider what activities are safe for you. It is up to you to get the results of your procedure. Ask your provider, or the department that is doing the procedure, when your results will be ready. Contact a health care provider if: You have any symptoms of allergy to the contrast dye. These include: Shortness of breath. Rash or hives. A racing heartbeat. You notice a change in your peeing (urination). This information is not intended to replace advice given to you by your health care provider. Make sure you discuss any questions you have with your health care provider. Document Revised: 04/21/2022 Document Reviewed: 04/21/2022 Elsevier Patient Education  2024 [employer].

## 2023-03-28 ENCOUNTER — Telehealth: Payer: Self-pay | Admitting: Cardiology

## 2023-03-28 ENCOUNTER — Telehealth (HOSPITAL_COMMUNITY): Payer: Self-pay | Admitting: Emergency Medicine

## 2023-03-28 ENCOUNTER — Telehealth (HOSPITAL_COMMUNITY): Payer: Self-pay | Admitting: *Deleted

## 2023-03-28 NOTE — Telephone Encounter (Signed)
Reaching out to patient to offer assistance regarding upcoming cardiac imaging study; pt verbalizes understanding of appt date/time, parking situation and where to check in, pre-test NPO status and medications ordered, and verified current allergies; name and call back number provided for further questions should they arise Myking Sar RN Navigator Cardiac Imaging [facility] [phone] office [phone] cell 

## 2023-03-28 NOTE — Telephone Encounter (Signed)
RX called to prevo.

## 2023-03-28 NOTE — Telephone Encounter (Signed)
  Pt c/o medication issue:  1. Name of Medication: ativan  2. How are you currently taking this medication (dosage and times per day)?   3. Are you having a reaction (difficulty breathing--STAT)? No   4. What is your medication issue? Pt said, ativan should be sent out for him to take before his CT. He said, he needs to called in at [facility] drug

## 2023-03-28 NOTE — Telephone Encounter (Signed)
Attempted to call patient regarding upcoming cardiac CT appointment. Unable to leave message-voicemail full.   Johney Frame RN Navigator Cardiac Imaging [facility] [phone] Office

## 2023-03-29 ENCOUNTER — Ambulatory Visit (HOSPITAL_COMMUNITY)
Admission: RE | Admit: 2023-03-29 | Discharge: 2023-03-29 | Disposition: A | Discharge status: Home / Self Care | Payer: BC Managed Care – PPO | Source: Ambulatory Visit | Attending: Cardiology | Admitting: Cardiology

## 2023-03-29 ENCOUNTER — Ambulatory Visit (HOSPITAL_BASED_OUTPATIENT_CLINIC_OR_DEPARTMENT_OTHER)
Admission: RE | Admit: 2023-03-29 | Discharge: 2023-03-29 | Disposition: A | Discharge status: Home / Self Care | Payer: BC Managed Care – PPO | Source: Ambulatory Visit | Attending: Cardiology | Admitting: Cardiology

## 2023-03-29 ENCOUNTER — Other Ambulatory Visit: Payer: Self-pay | Admitting: Cardiology

## 2023-03-29 DIAGNOSIS — R072 Precordial pain: Secondary | ICD-10-CM | POA: Diagnosis not present

## 2023-03-29 DIAGNOSIS — I2510 Atherosclerotic heart disease of native coronary artery without angina pectoris: Secondary | ICD-10-CM

## 2023-03-29 DIAGNOSIS — R931 Abnormal findings on diagnostic imaging of heart and coronary circulation: Secondary | ICD-10-CM

## 2023-03-29 MED ORDER — NITROGLYCERIN 0.4 MG SL SUBL
0.8000 mg | SUBLINGUAL_TABLET | Freq: Once | SUBLINGUAL | Status: AC
  Administered 2023-03-29: 0.8 mg via SUBLINGUAL

## 2023-03-29 MED ORDER — NITROGLYCERIN 0.4 MG SL SUBL
SUBLINGUAL_TABLET | SUBLINGUAL | Status: AC
  Filled 2023-03-29: qty 2

## 2023-03-29 MED ORDER — IOHEXOL 350 MG/ML SOLN
95.0000 mL | Freq: Once | INTRAVENOUS | Status: AC | PRN
  Administered 2023-03-29: 95 mL via INTRAVENOUS

## 2023-03-30 ENCOUNTER — Telehealth: Payer: Self-pay | Admitting: Cardiology

## 2023-03-30 NOTE — Telephone Encounter (Signed)
  The patient would like to inquire if he can travel by airplane based on his CT result. He has scheduled an appointment with Scott Mcguire on July 15th and intends to travel from July 8th to July 12th.

## 2023-04-02 ENCOUNTER — Ambulatory Visit: Payer: BC Managed Care – PPO | Admitting: Cardiology

## 2023-04-02 ENCOUNTER — Other Ambulatory Visit: Payer: Self-pay

## 2023-04-02 MED ORDER — NITROGLYCERIN 0.4 MG SL SUBL: 0.4000 mg | SUBLINGUAL_TABLET | SUBLINGUAL | 1 refills | Status: AC | PRN

## 2023-04-02 NOTE — Progress Notes (Addendum)
Cardiology Office Note:    Date:  04/03/2023   ID:  Scott Mcguire, DOB [birth year], MRN [id number]  PCP:  Galvin Proffer, MD   [facility] Providers Cardiologist:  Thomasene Ripple, DO     Referring MD: Galvin Proffer, MD   CC: chest pain   History of Present Illness:    Scott Mcguire is a 52 y.o. 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  He wore a monitor in September 2021 which revealed an average heart rate of 83 bpm, predominant underlying rhythm was sinus, 2 episodes of SVT, all triggered events were associated with PVCs. Echocardiogram in August 2021 revealed an EF of 55 to 60%, mild concentric LVH, trace MR, mild TR.  Coronary CTA in 2022 the calcium score of 8.6, minimal CAD.  Most recently evaluated on 03/16/2023 for evaluation of chest pain and DOE.  Chest pain has mixed features could occur at rest or with exertion.  He endorses a very high level of stress at work.  We arranged a coronary CTA which revealed a coronary calcium score of 43.8, placing him in the 77th percentile, FFR suggested flow-limiting stenosis in the LAD and left circumflex.   He presents today for follow-up after abnormal coronary CTA, he continues to have DOE with minimal exertion as well as intermittent episodes of chest pain that are hard for him to describe.  He has not needed to take the nitroglycerin however he is hesitant to try it secondary to migraine history.  We discussed the different options for moving forward regarding his abnormal coronary CTA, he agrees to proceed with a left heart catheterization. He denies palpitations, dyspnea, pnd, orthopnea, n, v, dizziness, syncope, edema, weight gain, or early satiety.   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    aspirin EC 81 MG tablet Take 81 mg by mouth daily. Swallow whole.   atorvastatin (LIPITOR) 80 MG tablet Take 80 mg by mouth daily.   diltiazem (CARDIZEM CD) 240 MG 24 hr capsule Take 1 capsule (240 mg total) by mouth daily.   ezetimibe (ZETIA) 10 MG tablet Take 1 tablet (10 mg total) by mouth at bedtime.   hydrochlorothiazide (MICROZIDE) 12.5 MG capsule Take 1 capsule (12.5 mg total) by mouth daily.   nitroGLYCERIN (NITROSTAT) 0.4 MG SL tablet Place 1 tablet (0.4 mg total) under the tongue every 5 (five) minutes as needed for chest pain.   omeprazole (PRILOSEC) 40 MG capsule Take 40 mg by mouth daily.   propranolol (INDERAL) 20 MG tablet Take one tablet by mouth as needed if heart rate greater then 120     Allergies:   Cefuroxime and Cefuroxime axetil   Social History   Socioeconomic History   Marital status: Married    Spouse name: Not on file   Number of children: Not on file   Years of education: Not  on file   Highest education level: Not on file  Occupational History   Not on file  Tobacco Use   Smoking status: Never   Smokeless tobacco: Never  Substance and Sexual Activity   Alcohol use: Never   Drug use: Never   Sexual activity: Not on file  Other Topics Concern   Not on file  Social History Narrative   Not on file   Social Determinants of Health   Financial Resource Strain: Not on file  Food Insecurity: Not on file  Transportation Needs: Not on file  Physical Activity: Not on file  Stress: Not on file  Social Connections: Not on file     Family History: The patient's family  history includes Heart disease in his father and mother.  ROS:   Please see the history of present illness.     All other systems reviewed and are negative.  EKGs/Labs/Other Studies Reviewed:   Cardiac Studies & Procedures     STRESS TESTS  MYOCARDIAL PERFUSION IMAGING 07/16/2020     MONITORS  LONG TERM MONITOR (3-14 DAYS) 07/07/2020  Narrative The patient wore the monitor for 3 days 1 hour starting 06/26/2020. Indication: Palpitations  The minimum heart rate was 52 bpm, maximum heart rate was 196 bpm, and average heart rate was 83 bpm. Predominant underlying rhythm was Sinus Rhythm.  2 Supraventricular Tachycardia runs occurred, the run with the fastest interval lasting 5 beats with a maximum rate of 171 bpm (average 146 bpm); the run with the fastest interval was also the longest.  Premature atrial complexes were rare. Premature Ventricular complexes were occasional (1.0%, 3730).  No ventricular tachycardia, no pauses, No AV block and no atrial fibrillation present. 3 patient triggered events all associated with premature ventricular complex. 5 diary events 3 associated with premature ventricular complex and the remaining with sinus rhythm.  Conclusion: This study is remarkable for the following: 1. Rare asymptomatic atrial tachycardia with variable block. 2. Occasional premature ventricular complexes.   CT SCANS  CT CORONARY MORPH W/CTA COR W/SCORE 03/29/2023  Narrative CLINICAL DATA:  52 yo male with chest pain  EXAM: Cardiac/Coronary CTA  TECHNIQUE: A non-contrast, gated CT scan was obtained with axial slices of 3 mm through the heart for calcium scoring. Calcium scoring was performed using the Agatston method. A 120 kV prospective, gated, contrast cardiac scan was obtained. Gantry rotation speed was 250 msecs and collimation was 0.6 mm. Two sublingual nitroglycerin tablets (0.8 mg) were given. The 3D data set was reconstructed in 5% intervals of the 35-75% of  the R-R cycle. Diastolic phases were analyzed on a dedicated workstation using MPR, MIP, and VRT modes. The patient received 95 cc of contrast.  FINDINGS: Image quality: Poor.  Noise artifact is: Moderate (misregistration; signal to noise).  Coronary Arteries:  Normal coronary origin.  Right dominance.  Left main: The left main is a large caliber vessel with a normal take off from the left coronary cusp that trifurcates into a LAD, LCX, and ramus intermedius. There is no plaque or stenosis.  Left anterior descending artery: The LAD has moderate (50-69) mixed plaque stenosis in the proximal vessel. The LAD gives off 2 small diagonal branches.  Ramus intermedius: Large, branching; moderate (50-69) stenosis in proximal, medial branch.  Left circumflex artery: The LCX is non-dominant and patent with severe (70-99) stenosis in the mid vessel. The LCX gives off 2 obtuse marginal branches not well visualized.  Right coronary artery: The RCA is dominant with normal take  off from the right coronary cusp. There is no evidence of plaque or stenosis. The RCA terminates as a PDA and right posterolateral branch without evidence of plaque or stenosis.  Right Atrium: Right atrial size is within normal limits.  Right Ventricle: The right ventricular cavity is within normal limits.  Left Atrium: Left atrial size is normal in size with no left atrial appendage filling defect.  Left Ventricle: The ventricular cavity size is within normal limits. There are no stigmata of prior infarction. There is no abnormal filling defect.  Pulmonary arteries: Normal in size without proximal filling defect.  Pulmonary veins: Normal pulmonary venous drainage.  Pericardium: Normal thickness with no significant effusion or calcium present.  Cardiac valves: The aortic valve is trileaflet without significant calcification. The mitral valve is normal structure without significant calcification.  Aorta:  Normal caliber with mild aortic atherosclerosis.  Extra-cardiac findings: See attached radiology report for non-cardiac structures.  IMPRESSION: 1. Coronary calcium score of 43.8. This was 73 percentile for age-, sex, and race-matched controls.  2. Normal coronary origin with right dominance.  3. Severe (70-99) stenosis in the LCX and moderate (50-69) stenoses in the LAD and branch of the ramus intermedius.  4. Mild aortic atherosclerosis.  5. Study will be sent for FFR.  RECOMMENDATIONS: CAD-RADS 4: Severe stenosis. (70-99% or > 50% left main). Cardiac catheterization or CT FFR is recommended. Consider symptom-guided anti-ischemic pharmacotherapy as well as risk factor modification per guideline directed care. Invasive coronary angiography recommended with revascularization per published guideline statements.  Olga Millers, MD   Electronically Signed By: Olga Millers M.D. On: 03/29/2023 18:55   CT SCANS  CT CORONARY MORPH W/CTA COR W/SCORE 11/10/2020  Addendum 11/10/2020  4:04 PM ADDENDUM REPORT: 11/10/2020 16:02  CLINICAL DATA:  This is a 52 year old male with chest pain  EXAM: Cardiac/Coronary  CT  TECHNIQUE: The patient was scanned on a [employer].  FINDINGS: A 120 kV prospective scan was triggered in the descending thoracic aorta at 111 HU's. Axial non-contrast 3 mm slices were carried out through the heart. The data set was analyzed on a dedicated work station and scored using the Agatson method. Gantry rotation speed was 250 msecs and collimation was .6 mm. No beta blockade and 0.8 mg of sl NTG was given. The 3D data set was reconstructed in 5% intervals of the 67-82 % of the R-R cycle. Diastolic phases were analyzed on a dedicated work station using MPR, MIP and VRT modes. The patient received 80 cc of contrast.  Aorta: Normal size.  No calcifications.  No dissection.  Aortic Valve:  Trileaflet.  No calcifications.  Coronary calcium  score 8.6.  Coronary Arteries:  Normal coronary origin.  Right dominance.  RCA is a large dominant artery that gives rise to PDA and PLVB. There is no plaque.  Left main is a large artery that gives rise to LAD and LCX arteries.  LAD is a large vessel. There is a minimal calcified plaque in the mid LAD. The proximal and distal LAD.  LCX is a non-dominant artery that gives rise to one large OM1 branch. There is no plaque. There is a stair-step artifact in the proximal LAD.  Other findings:  Normal pulmonary vein drainage into the left atrium.  Normal left atrial appendage without a thrombus.  Normal size of the pulmonary artery.  IMPRESSION: 1. Coronary calcium score of 8.6. This was 4 percentile for age and sex matched control.  2. Normal coronary origin  with right dominance.  3. Minimal CAD. CADRADS 1. Aggressive medical therapy is recommended.  Thomasene Ripple, DO   Electronically Signed By: Thomasene Ripple DO On: 11/10/2020 16:02  Narrative EXAM: OVER-READ INTERPRETATION  CT CHEST  The following report is an over-read performed by radiologist Dr. Charlett Nose of [facility] on 11/09/2020. This over-read does not include interpretation of cardiac or coronary anatomy or pathology. The coronary CTA interpretation by the cardiologist is attached.  COMPARISON:  None.  FINDINGS: Vascular: Heart is normal size.  Aorta normal caliber.  Mediastinum/Nodes: No adenopathy  Lungs/Pleura: Visualized lungs clear.  No effusions.  Upper Abdomen: Imaging into the upper abdomen demonstrates no acute findings.  Musculoskeletal: Chest wall soft tissues are unremarkable. No acute bony abnormality.  IMPRESSION: No acute or significant extracardiac abnormality.  Electronically Signed: By: Charlett Nose M.D. On: 11/09/2020 19:22           Recent Labs: No results found for requested labs within last 365 days.  Recent Lipid Panel No results found for: "CHOL",  "TRIG", "HDL", "CHOLHDL", "VLDL", "LDLCALC", "LDLDIRECT"   Risk Assessment/Calculations:                Physical Exam:    VS:  BP 110/80 (BP Location: Right Arm, Patient Position: Sitting, Cuff Size: Normal)   Pulse 80   Ht 5\' 10"  (1.778 m)   Wt 218 lb (98.9 kg)   SpO2 97%   BMI 31.28 kg/m     Wt Readings from Last 3 Encounters:  04/03/23 218 lb (98.9 kg)  03/16/23 217 lb (98.4 kg)  07/25/21 220 lb (99.8 kg)     GEN:  Well nourished, well developed in no acute distress HEENT: Normal NECK: No JVD; No carotid bruits LYMPHATICS: No lymphadenopathy CARDIAC: RRR, no murmurs, rubs, gallops RESPIRATORY:  Clear to auscultation without rales, wheezing or rhonchi  ABDOMEN: Soft, non-tender, non-distended MUSCULOSKELETAL:  No edema; No deformity  SKIN: Warm and dry NEUROLOGIC:  Alert and oriented x 3, anxious PSYCHIATRIC:  Normal affect   ASSESSMENT:    1. Hypertension, unspecified type   2. Other migraine without status migrainosus, not intractable   3. Mixed hyperlipidemia   4. Coronary artery disease involving native coronary artery of native heart without angina pectoris    PLAN:    In order of problems listed above:  Chest pain-chest pain has mixed features can occur with exertion or at rest.  Will repeat his coronary CTA which revealed  a coronary calcium score of 43.8, placing him in the 77th percentile, FFR suggested flow-limiting stenosis in the LAD and left circumflex.  Continue aspirin 81 mg daily, continue Lipitor 80 mg daily, continue Zetia 10 mg daily, continue nitroglycerin as needed.  We discussed proceeding with medical therapy versus proceeding with cardiac catheterization and he is agreeable to proceed with a cardiac catheterization.  We discussed risks/benefits of cardiac catheterization, answered all questions to his satisfaction, he is agreeable to proceed.  Will check CBC, BMET, direct LDL, lipoprotein a today. Paroxysmal atrial tachycardia/symptomatic  PVCs-quiescent.  Continue propranolol 20 mg as needed for heart rate greater than 120. Hyperlipidemia -most recent LDL was elevated at 152, he is already on max dose of Lipitor.  We started Zetia 10 mg daily 2 weeks ago, will recheck direct LDL today as well as LPA.  Likely Zetia will not get [place] to our goal and he will need referral to Pharm.D. for PCSK9 inhibitor consideration. Anxiety-he is a self-described very anxious person by  nature, he required Ativan prior to his coronary CTA which did not help very much.  Anticipate he will need additional anxiolytic during his procedure.   Disposition -CBC, BMET, direct LDL, lipoprotein a today.  Proceed with cardiac catheterization.  Return in 2 weeks to general cardiology.      Medication Adjustments/Labs and Tests Ordered: Current medicines are reviewed at length with the patient today.  Concerns regarding medicines are outlined above.  Orders Placed This Encounter  Procedures   CBC   Comprehensive metabolic panel   LDL cholesterol, direct   Lipoprotein A (LPA)   No orders of the defined types were placed in this encounter.   Patient Instructions  Medication Instructions:  Your physician has recommended you make the following change in your medication:   Take 81 mg coated aspirin daily.  Use nitroglycerin 1 tablet placed under the tongue at the first sign of chest pain or an angina attack. 1 tablet may be used every 5 minutes as needed, for up to 15 minutes. Do not take more than 3 tablets in 15 minutes. If pain persist call 911 or go to the nearest ED.   *If you need a refill on your cardiac medications before your next appointment, please call your pharmacy*   Lab Work: Your physician recommends that you have a CMP, CBC, direct LDL and Lp(a) today in the office for your upcoming procedure.  If you have labs (blood work) drawn today and your tests are completely normal, you will receive your results only by: MyChart Message (if you  have MyChart) OR A paper copy in the mail If you have any lab test that is abnormal or we need to change your treatment, we will call you to review the results.   Testing/Procedures:  [place] [facility] [place] [address] [place] [address] Dept: [phone] Loc: [phone]  Buron Marinoff  04/03/2023  You are scheduled for a Cardiac Catheterization on Wednesday, July 3 with Dr. Lance Muss.  1. Please arrive at the [facility] (Main Entrance A) at [facility]: [address] [place], [place] [address] at 6:30 AM (This time is 2 hour(s) before your procedure to ensure your preparation). Free valet parking service is available. You will check in at ADMITTING. The support person will be asked to wait in the waiting room.  It is OK to have someone drop you off and come back when you are ready to be discharged.    Special note: Every effort is made to have your procedure done on time. Please understand that emergencies sometimes delay scheduled procedures.  2. Diet: Do not eat solid foods after midnight.  The patient may have clear liquids until 5am upon the day of the procedure.  3. Labs: You had labs done today in the office.  4. Medication instructions in preparation for your procedure:   Contrast Allergy: No   Stop taking, HTCZ (Hydrochlorothiazide) Wednesday, July 3,   On the morning of your procedure, take your Aspirin 81 mg and any morning medicines NOT listed above.  You may use sips of water.  5. Plan to go home the same day, you will only stay overnight if medically necessary. 6. Bring a current list of your medications and current insurance cards. 7. You MUST have a responsible person to drive you home. 8. Someone MUST be with you the first 24 hours after you arrive home or your  discharge will be delayed. 9. Please wear clothes that are easy to get on and off and wear  slip-on shoes.  Thank you for allowing [place] to care for you!   -- [facility]     Follow-Up: At [facility], you and your health needs are our priority.  As part of our continuing mission to provide you with exceptional heart care, we have created designated Provider Care Teams.  These Care Teams include your primary Cardiologist (physician) and Advanced Practice Providers (APPs -  Physician Assistants and Nurse Practitioners) who all work together to provide you with the care you need, when you need it.  We recommend signing up for the patient portal called "MyChart".  Sign up information is provided on this After Visit Summary.  MyChart is used to connect with patients for Virtual Visits (Telemedicine).  Patients are able to view lab/test results, encounter notes, upcoming appointments, etc.  Non-urgent messages can be sent to your provider as well.   To learn more about what you can do with MyChart, go to [email or web address].    Your next appointment:   2 week(s)  The format for your next appointment:   In Person  Provider:   Wallis Bamberg, NP Rosalita Levan)   Other Instructions  Coronary Angiogram With Stent Coronary angiogram with stent placement is a procedure to widen or open a narrow blood vessel of the heart (coronary artery). Arteries may become blocked by cholesterol buildup (plaques) in the lining of the artery wall. When a coronary artery becomes partially blocked, blood flow to that area decreases. This may lead to chest pain or a heart attack (myocardial infarction). A stent is a small piece of metal that looks like mesh or spring. Stent placement may be done as treatment after a heart attack, or to prevent a heart attack if a blocked artery is found by a coronary angiogram. Let your health care provider know about: Any allergies you have, including allergies to medicines or contrast dye. All medicines you are taking, including  vitamins, herbs, eye drops, creams, and over-the-counter medicines. Any problems you or family members have had with anesthetic medicines. Any blood disorders you have. Any surgeries you have had. Any medical conditions you have, including kidney problems or kidney failure. Whether you are pregnant or may be pregnant. Whether you are breastfeeding. What are the risks? Generally, this is a safe procedure. However, serious problems may occur, including: Damage to nearby structures or organs, such as the heart, blood vessels, or kidneys. A return of blockage. Bleeding, infection, or bruising at the insertion site. A collection of blood under the skin (hematoma) at the insertion site. A blood clot in another part of the body. Allergic reaction to medicines or dyes. Bleeding into the abdomen (retroperitoneal bleeding). Stroke (rare). Heart attack (rare). What happens before the procedure? Staying hydrated Follow instructions from your health care provider about hydration, which may include: Up to 2 hours before the procedure - you may continue to drink clear liquids, such as water, clear fruit juice, black coffee, and plain tea.    Eating and drinking restrictions Follow instructions from your health care provider about eating and drinking, which may include: 8 hours before the procedure - stop eating heavy meals or foods, such as meat, fried foods, or fatty foods. 6 hours before the procedure - stop eating light meals or foods, such as toast or cereal. 2 hours before the procedure - stop drinking clear liquids. Medicines Ask your health  care provider about: Changing or stopping your regular medicines. This is especially important if you are taking diabetes medicines or blood thinners. Taking medicines such as aspirin and ibuprofen. These medicines can thin your blood. Do not take these medicines unless your health care provider tells you to take them. Generally, aspirin is recommended  before a thin tube, called a catheter, is passed through a blood vessel and inserted into the heart (cardiac catheterization). Taking over-the-counter medicines, vitamins, herbs, and supplements. General instructions Do not use any products that contain nicotine or tobacco for at least 4 weeks before the procedure. These products include cigarettes, e-cigarettes, and chewing tobacco. If you need help quitting, ask your health care provider. Plan to have someone take you home from the hospital or clinic. If you will be going home right after the procedure, plan to have someone with you for 24 hours. You may have tests and imaging procedures. Ask your health care provider: How your insertion site will be marked. Ask which artery will be used for the procedure. What steps will be taken to help prevent infection. These may include: Removing hair at the insertion site. Washing skin with a germ-killing soap. Taking antibiotic medicine. What happens during the procedure? An IV will be inserted into one of your veins. Electrodes may be placed on your chest to monitor your heart rate during the procedure. You will be given one or more of the following: A medicine to help you relax (sedative). A medicine to numb the area (local anesthetic) for catheter insertion. A small incision will be made for catheter insertion. The catheter will be inserted into an artery using a guide wire. The location may be in your groin, your wrist, or the fold of your arm (near your elbow). An X-ray procedure (fluoroscopy) will be used to help guide the catheter to the opening of the heart arteries. A dye will be injected into the catheter. X-rays will be taken. The dye helps to show where any narrowing or blockages are located in the arteries. Tell your health care provider if you have chest pain or trouble breathing. A tiny wire will be guided to the blocked spot, and a balloon will be inflated to make the artery  wider. The stent will be expanded to crush the plaques into the wall of the vessel. The stent will hold the area open and improve the blood flow. Most stents have a drug coating to reduce the risk of the stent narrowing over time. The artery may be made wider using a drill, laser, or other tools that remove plaques. The catheter will be removed when the blood flow improves. The stent will stay where it was placed, and the lining of the artery will grow over it. A bandage (dressing) will be placed on the insertion site. Pressure will be applied to stop bleeding. The IV will be removed. This procedure may vary among health care providers and hospitals.    What happens after the procedure? Your blood pressure, heart rate, breathing rate, and blood oxygen level will be monitored until you leave the hospital or clinic. If the procedure is done through the leg, you will lie flat in bed for a few hours or for as long as told by your health care provider. You will be instructed not to bend or cross your legs. The insertion site and the pulse in your foot or wrist will be checked often. You may have more blood tests, X-rays, and a test that records the  electrical activity of your heart (electrocardiogram, or ECG). Do not drive for 24 hours if you were given a sedative during your procedure. Summary Coronary angiogram with stent placement is a procedure to widen or open a narrowed coronary artery. This is done to treat heart problems. Before the procedure, let your health care provider know about all the medical conditions and surgeries you have or have had. This is a safe procedure. However, some problems may occur, including damage to nearby structures or organs, bleeding, blood clots, or allergies. Follow your health care provider's instructions about eating, drinking, medicines, and other lifestyle changes, such as quitting tobacco use before the procedure. This information is not intended to replace  advice given to you by your health care provider. Make sure you discuss any questions you have with your health care provider. Document Revised: 04/09/2019 Document Reviewed: 04/09/2019 Elsevier Patient Education  2021 [employer]  Aspirin and Your Heart Aspirin is a medicine that prevents the platelets in your blood from sticking together. Platelets are the cells that your blood uses for clotting. Aspirin can be used to help reduce the risk of blood clots, heart attacks, and other heart-related problems. What are the risks? Daily use of aspirin can cause side effects. Some of these include: Bleeding. Bleeding can be minor or serious. An example of minor bleeding is bleeding from a cut, and the bleeding does not stop. An example of more serious bleeding is stomach bleeding or, rarely, bleeding into the brain. Your risk of bleeding increases if you are also taking NSAIDs, such as ibuprofen. Increased bruising. Upset stomach. An allergic reaction. People who have growths inside the nose (nasal polyps) have an increased risk of developing an aspirin allergy. How to use aspirin to care for your heart Take aspirin only as told by your health care provider. Make sure that you understand how much to take and what form to take. The two forms of aspirin are: Non-enteric-coated.This type of aspirin does not have a coating and is absorbed quickly. This type of aspirin also comes in a chewable form. Enteric-coated. This type of aspirin has a coating that releases the medicine very slowly. Enteric-coated aspirin might cause less stomach upset than non-enteric-coated aspirin. This type of aspirin should not be chewed or crushed. Work with your health care provider to find out whether it is safe and beneficial for you to take aspirin daily. Taking aspirin daily may be helpful if: You have had a heart attack or chest pain, or you are at risk for a heart attack. You have a condition in which certain heart  vessels are blocked (coronary artery disease), and you have had a procedure to treat it. Examples are: Open-heart surgery, such as coronary artery bypass surgery (CABG). Coronary angioplasty,which is done to widen a blood vessel of your heart. Having a small mesh tube, or stent, placed in your coronary artery. You have had certain types of stroke or a mini-stroke known as a transient ischemic attack (TIA). You have a narrowing of the arteries that supply the limbs (peripheral artery disease, or PAD). You have long-term (chronic) heart rhythm problems, such as atrial fibrillation, and your health care provider thinks aspirin may help. You have valve disease or have had surgery on a valve. You are considered at increased risk of developing coronary artery disease or PAD.    Follow these instructions at home Medicines Take over-the-counter and prescription medicines only as told by your health care provider. If you are taking blood  thinners: Talk with your health care provider before you take any medicines that contain aspirin or NSAIDs, such as ibuprofen. These medicines increase your risk for dangerous bleeding. Take your medicine exactly as told, at the same time every day. Avoid activities that could cause injury or bruising, and follow instructions about how to prevent falls. Wear a medical alert bracelet or carry a card that lists what medicines you take. General instructions Do not drink alcohol if: Your health care provider tells you not to drink. You are pregnant, may be pregnant, or are planning to become pregnant. If you drink alcohol: Limit how much you use to: 0-1 drink a day for women. 0-2 drinks a day for men. Be aware of how much alcohol is in your drink. In the [place] one drink equals one 12 oz bottle of beer (355 mL), one 5 oz glass of wine (148 mL), or one 1 oz glass of hard liquor (44 mL). Keep all follow-up visits as told by your health care provider. This is  important. Where to find more information The American Heart Association: www.heart.org Contact a health care provider if you have: Unusual bleeding or bruising. Stomach pain or nausea. Ringing in your ears. An allergic reaction that causes hives, itchy skin, or swelling of the lips, tongue, or face. Get help right away if: You notice that your bowel movements are bloody, or dark red or black in color. You vomit or cough up blood. You have blood in your urine. You cough, breathe loudly (wheeze), or feel short of breath. You have chest pain, especially if the pain spreads to your arms, back, neck, or jaw. You have a headache with confusion. You have any symptoms of a stroke. "BE FAST" is an easy way to remember the main warning signs of a stroke: B - Balance. Signs are dizziness, sudden trouble walking, or loss of balance. E - Eyes. Signs are trouble seeing or a sudden change in vision. F - Face. Signs are sudden weakness or numbness of the face, or the face or eyelid drooping on one side. A - Arms. Signs are weakness or numbness in an arm. This happens suddenly and usually on one side of the body. S - Speech. Signs are sudden trouble speaking, slurred speech, or trouble understanding what people say. T - Time. Time to call emergency services. Write down what time symptoms started. You have other signs of a stroke, such as: A sudden, severe headache with no known cause. Nausea or vomiting. Seizure. These symptoms may represent a serious problem that is an emergency. Do not wait to see if the symptoms will go away. Get medical help right away. Call your local emergency services (911 in the [place] Do not drive yourself to the hospital. Summary Aspirin use can help reduce the risk of blood clots, heart attacks, and other heart-related problems. Daily use of aspirin can cause side effects. Take aspirin only as told by your health care provider. Make sure that you understand how much to take  and what form to take. Your health care provider will help you determine whether it is safe and beneficial for you to take aspirin daily. This information is not intended to replace advice given to you by your health care provider. Make sure you discuss any questions you have with your health care provider. Document Revised: 06/23/2019 Document Reviewed: 06/23/2019 Elsevier Patient Education  2021 [employer] Nitroglycerin sublingual tablets What is this medicine? NITROGLYCERIN (nye troe GLI ser in) is a  type of vasodilator. It relaxes blood vessels, increasing the blood and oxygen supply to your heart. This medicine is used to relieve chest pain caused by angina. It is also used to prevent chest pain before activities like climbing stairs, going outdoors in cold weather, or sexual activity. This medicine may be used for other purposes; ask your health care provider or pharmacist if you have questions. COMMON BRAND NAME(S): Nitroquick, Nitrostat, Nitrotab What should I tell my health care provider before I take this medicine? They need to know if you have any of these conditions: anemia head injury, recent stroke, or bleeding in the brain liver disease previous heart attack an unusual or allergic reaction to nitroglycerin, other medicines, foods, dyes, or preservatives pregnant or trying to get pregnant breast-feeding How should I use this medicine? Take this medicine by mouth as needed. Use at the first sign of an angina attack (chest pain or tightness). You can also take this medicine 5 to 10 minutes before an event likely to produce chest pain. Follow the directions exactly as written on the prescription label. Place one tablet under your tongue and let it dissolve. Do not swallow whole. Replace the dose if you accidentally swallow it. It will help if your mouth is not dry. Saliva around the tablet will help it to dissolve more quickly. Do not eat or drink, smoke or chew tobacco while a  tablet is dissolving. Sit down when taking this medicine. In an angina attack, you should feel better within 5 minutes after your first dose. You can take a dose every 5 minutes up to a total of 3 doses. If you do not feel better or feel worse after 1 dose, call 9-1-1 at once. Do not take more than 3 doses in 15 minutes. Your health care provider might give you other directions. Follow those directions if he or she does. Do not take your medicine more often than directed. Talk to your health care provider about the use of this medicine in children. Special care may be needed. Overdosage: If you think you have taken too much of this medicine contact a poison control center or emergency room at once. NOTE: This medicine is only for you. Do not share this medicine with others. What if I miss a dose? This does not apply. This medicine is only used as needed. What may interact with this medicine? Do not take this medicine with any of the following medications: certain migraine medicines like ergotamine and dihydroergotamine (DHE) medicines used to treat erectile dysfunction like sildenafil, tadalafil, and vardenafil riociguat This medicine may also interact with the following medications: alteplase aspirin heparin medicines for high blood pressure medicines for mental depression other medicines used to treat angina phenothiazines like chlorpromazine, mesoridazine, prochlorperazine, thioridazine This list may not describe all possible interactions. Give your health care provider a list of all the medicines, herbs, non-prescription drugs, or dietary supplements you use. Also tell them if you smoke, drink alcohol, or use illegal drugs. Some items may interact with your medicine. What should I watch for while using this medicine? Tell your doctor or health care professional if you feel your medicine is no longer working. Keep this medicine with you at all times. Sit or lie down when you take your  medicine to prevent falling if you feel dizzy or faint after using it. Try to remain calm. This will help you to feel better faster. If you feel dizzy, take several deep breaths and lie down with your feet  propped up, or bend forward with your head resting between your knees. You may get drowsy or dizzy. Do not drive, use machinery, or do anything that needs mental alertness until you know how this drug affects you. Do not stand or sit up quickly, especially if you are an older patient. This reduces the risk of dizzy or fainting spells. Alcohol can make you more drowsy and dizzy. Avoid alcoholic drinks. Do not treat yourself for coughs, colds, or pain while you are taking this medicine without asking your doctor or health care professional for advice. Some ingredients may increase your blood pressure. What side effects may I notice from receiving this medicine? Side effects that you should report to your doctor or health care professional as soon as possible: allergic reactions (skin rash, itching or hives; swelling of the face, lips, or tongue) low blood pressure (dizziness; feeling faint or lightheaded, falls; unusually weak or tired) low red blood cell counts (trouble breathing; feeling faint; lightheaded, falls; unusually weak or tired) Side effects that usually do not require medical attention (report to your doctor or health care professional if they continue or are bothersome): facial flushing (redness) headache nausea, vomiting This list may not describe all possible side effects. Call your doctor for medical advice about side effects. You may report side effects to FDA at 1-800-FDA-1088. Where should I keep my medicine? Keep out of the reach of children. Store at room temperature between 20 and 25 degrees C (68 and 77 degrees F). Store in Retail buyer. Protect from light and moisture. Keep tightly closed. Throw away any unused medicine after the expiration date. NOTE: This sheet is a  summary. It may not cover all possible information. If you have questions about this medicine, talk to your doctor, pharmacist, or health care provider.  2021 Elsevier/[employer] Standard (2018-06-19 16:46:32)    Signed, Flossie Dibble, NP  04/03/2023 4:31 PM    [facility]

## 2023-04-02 NOTE — Telephone Encounter (Signed)
Patient informed of results of his cardiac CTA and instructed on how to take nitroglycerin if needed. An appointment was scheduled for him to see Scott Bamberg NP at 3:00 pm on 04/03/23.

## 2023-04-03 ENCOUNTER — Ambulatory Visit: Payer: BC Managed Care – PPO | Attending: Cardiology | Admitting: Cardiology

## 2023-04-03 ENCOUNTER — Encounter: Payer: Self-pay | Admitting: Cardiology

## 2023-04-03 VITALS — BP 110/80 | HR 80 | Ht 70.0 in | Wt 218.0 lb

## 2023-04-03 DIAGNOSIS — I25119 Atherosclerotic heart disease of native coronary artery with unspecified angina pectoris: Secondary | ICD-10-CM

## 2023-04-03 DIAGNOSIS — E782 Mixed hyperlipidemia: Secondary | ICD-10-CM | POA: Diagnosis not present

## 2023-04-03 DIAGNOSIS — G43809 Other migraine, not intractable, without status migrainosus: Secondary | ICD-10-CM | POA: Diagnosis not present

## 2023-04-03 DIAGNOSIS — I2510 Atherosclerotic heart disease of native coronary artery without angina pectoris: Secondary | ICD-10-CM

## 2023-04-03 DIAGNOSIS — I10 Essential (primary) hypertension: Secondary | ICD-10-CM | POA: Diagnosis not present

## 2023-04-03 NOTE — Patient Instructions (Addendum)
Medication Instructions:  Your physician has recommended you make the following change in your medication:   Take 81 mg coated aspirin daily.  Use nitroglycerin 1 tablet placed under the tongue at the first sign of chest pain or an angina attack. 1 tablet may be used every 5 minutes as needed, for up to 15 minutes. Do not take more than 3 tablets in 15 minutes. If pain persist call 911 or go to the nearest ED.   *If you need a refill on your cardiac medications before your next appointment, please call your pharmacy*   Lab Work: Your physician recommends that you have a CMP, CBC, direct LDL and Lp(a) today in the office for your upcoming procedure.  If you have labs (blood work) drawn today and your tests are completely normal, you will receive your results only by: MyChart Message (if you have MyChart) OR A paper copy in the mail If you have any lab test that is abnormal or we need to change your treatment, we will call you to review the results.   Testing/Procedures:  [facility] DEPT OF [facility] AT [place] [address] [place] [address] Dept: [phone] Loc: [phone]  Scott Mcguire  04/03/2023  You are scheduled for a Cardiac Catheterization on Wednesday, July 3 with Dr. Lance Muss.  1. Please arrive at the [facility] (Main Entrance A) at [facility]: [address] [place], [place] [address] at 6:30 AM (This time is 2 hour(s) before your procedure to ensure your preparation). Free valet parking service is available. You will check in at ADMITTING. The support person will be asked to wait in the waiting room.  It is OK to have someone drop you off and come back when you are ready to be discharged.    Special note: Every effort is made to have your procedure done on time. Please understand that emergencies sometimes delay scheduled procedures.  2. Diet: Do not eat solid foods after  midnight.  The patient may have clear liquids until 5am upon the day of the procedure.  3. Labs: You had labs done today in the office.  4. Medication instructions in preparation for your procedure:   Contrast Allergy: No   Stop taking, HTCZ (Hydrochlorothiazide) Wednesday, July 3,   On the morning of your procedure, take your Aspirin 81 mg and any morning medicines NOT listed above.  You may use sips of water.  5. Plan to go home the same day, you will only stay overnight if medically necessary. 6. Bring a current list of your medications and current insurance cards. 7. You MUST have a responsible person to drive you home. 8. Someone MUST be with you the first 24 hours after you arrive home or your discharge will be delayed. 9. Please wear clothes that are easy to get on and off and wear slip-on shoes.  Thank you for allowing [place] to care for you!   -- [facility]     Follow-Up: At [facility], you and your health needs are our priority.  As part of our continuing mission to provide you with exceptional heart care, we have created designated Provider Care Teams.  These Care Teams include your primary Cardiologist (physician) and Advanced Practice Providers (APPs -  Physician Assistants and Nurse Practitioners) who all work together to provide you with the care you need, when you need it.  We recommend signing up for  the patient portal called "MyChart".  Sign up information is provided on this After Visit Summary.  MyChart is used to connect with patients for Virtual Visits (Telemedicine).  Patients are able to view lab/test results, encounter notes, upcoming appointments, etc.  Non-urgent messages can be sent to your provider as well.   To learn more about what you can do with MyChart, go to [email or web address].    Your next appointment:   2 week(s)  The format for your next appointment:   In Person  Provider:   Wallis Bamberg, NP  Rosalita Levan)   Other Instructions  Coronary Angiogram With Stent Coronary angiogram with stent placement is a procedure to widen or open a narrow blood vessel of the heart (coronary artery). Arteries may become blocked by cholesterol buildup (plaques) in the lining of the artery wall. When a coronary artery becomes partially blocked, blood flow to that area decreases. This may lead to chest pain or a heart attack (myocardial infarction). A stent is a small piece of metal that looks like mesh or spring. Stent placement may be done as treatment after a heart attack, or to prevent a heart attack if a blocked artery is found by a coronary angiogram. Let your health care provider know about: Any allergies you have, including allergies to medicines or contrast dye. All medicines you are taking, including vitamins, herbs, eye drops, creams, and over-the-counter medicines. Any problems you or family members have had with anesthetic medicines. Any blood disorders you have. Any surgeries you have had. Any medical conditions you have, including kidney problems or kidney failure. Whether you are pregnant or may be pregnant. Whether you are breastfeeding. What are the risks? Generally, this is a safe procedure. However, serious problems may occur, including: Damage to nearby structures or organs, such as the heart, blood vessels, or kidneys. A return of blockage. Bleeding, infection, or bruising at the insertion site. A collection of blood under the skin (hematoma) at the insertion site. A blood clot in another part of the body. Allergic reaction to medicines or dyes. Bleeding into the abdomen (retroperitoneal bleeding). Stroke (rare). Heart attack (rare). What happens before the procedure? Staying hydrated Follow instructions from your health care provider about hydration, which may include: Up to 2 hours before the procedure - you may continue to drink clear liquids, such as water, clear fruit  juice, black coffee, and plain tea.    Eating and drinking restrictions Follow instructions from your health care provider about eating and drinking, which may include: 8 hours before the procedure - stop eating heavy meals or foods, such as meat, fried foods, or fatty foods. 6 hours before the procedure - stop eating light meals or foods, such as toast or cereal. 2 hours before the procedure - stop drinking clear liquids. Medicines Ask your health care provider about: Changing or stopping your regular medicines. This is especially important if you are taking diabetes medicines or blood thinners. Taking medicines such as aspirin and ibuprofen. These medicines can thin your blood. Do not take these medicines unless your health care provider tells you to take them. Generally, aspirin is recommended before a thin tube, called a catheter, is passed through a blood vessel and inserted into the heart (cardiac catheterization). Taking over-the-counter medicines, vitamins, herbs, and supplements. General instructions Do not use any products that contain nicotine or tobacco for at least 4 weeks before the procedure. These products include cigarettes, e-cigarettes, and chewing tobacco. If you need help quitting, ask your health care  provider. Plan to have someone take you home from the hospital or clinic. If you will be going home right after the procedure, plan to have someone with you for 24 hours. You may have tests and imaging procedures. Ask your health care provider: How your insertion site will be marked. Ask which artery will be used for the procedure. What steps will be taken to help prevent infection. These may include: Removing hair at the insertion site. Washing skin with a germ-killing soap. Taking antibiotic medicine. What happens during the procedure? An IV will be inserted into one of your veins. Electrodes may be placed on your chest to monitor your heart rate during the  procedure. You will be given one or more of the following: A medicine to help you relax (sedative). A medicine to numb the area (local anesthetic) for catheter insertion. A small incision will be made for catheter insertion. The catheter will be inserted into an artery using a guide wire. The location may be in your groin, your wrist, or the fold of your arm (near your elbow). An X-ray procedure (fluoroscopy) will be used to help guide the catheter to the opening of the heart arteries. A dye will be injected into the catheter. X-rays will be taken. The dye helps to show where any narrowing or blockages are located in the arteries. Tell your health care provider if you have chest pain or trouble breathing. A tiny wire will be guided to the blocked spot, and a balloon will be inflated to make the artery wider. The stent will be expanded to crush the plaques into the wall of the vessel. The stent will hold the area open and improve the blood flow. Most stents have a drug coating to reduce the risk of the stent narrowing over time. The artery may be made wider using a drill, laser, or other tools that remove plaques. The catheter will be removed when the blood flow improves. The stent will stay where it was placed, and the lining of the artery will grow over it. A bandage (dressing) will be placed on the insertion site. Pressure will be applied to stop bleeding. The IV will be removed. This procedure may vary among health care providers and hospitals.    What happens after the procedure? Your blood pressure, heart rate, breathing rate, and blood oxygen level will be monitored until you leave the hospital or clinic. If the procedure is done through the leg, you will lie flat in bed for a few hours or for as long as told by your health care provider. You will be instructed not to bend or cross your legs. The insertion site and the pulse in your foot or wrist will be checked often. You may have more  blood tests, X-rays, and a test that records the electrical activity of your heart (electrocardiogram, or ECG). Do not drive for 24 hours if you were given a sedative during your procedure. Summary Coronary angiogram with stent placement is a procedure to widen or open a narrowed coronary artery. This is done to treat heart problems. Before the procedure, let your health care provider know about all the medical conditions and surgeries you have or have had. This is a safe procedure. However, some problems may occur, including damage to nearby structures or organs, bleeding, blood clots, or allergies. Follow your health care provider's instructions about eating, drinking, medicines, and other lifestyle changes, such as quitting tobacco use before the procedure. This information is not intended to  replace advice given to you by your health care provider. Make sure you discuss any questions you have with your health care provider. Document Revised: 04/09/2019 Document Reviewed: 04/09/2019 Elsevier Patient Education  2021 Elsevier Inc.  Aspirin and Your Heart Aspirin is a medicine that prevents the platelets in your blood from sticking together. Platelets are the cells that your blood uses for clotting. Aspirin can be used to help reduce the risk of blood clots, heart attacks, and other heart-related problems. What are the risks? Daily use of aspirin can cause side effects. Some of these include: Bleeding. Bleeding can be minor or serious. An example of minor bleeding is bleeding from a cut, and the bleeding does not stop. An example of more serious bleeding is stomach bleeding or, rarely, bleeding into the brain. Your risk of bleeding increases if you are also taking NSAIDs, such as ibuprofen. Increased bruising. Upset stomach. An allergic reaction. People who have growths inside the nose (nasal polyps) have an increased risk of developing an aspirin allergy. How to use aspirin to care for your  heart Take aspirin only as told by your health care provider. Make sure that you understand how much to take and what form to take. The two forms of aspirin are: Non-enteric-coated.This type of aspirin does not have a coating and is absorbed quickly. This type of aspirin also comes in a chewable form. Enteric-coated. This type of aspirin has a coating that releases the medicine very slowly. Enteric-coated aspirin might cause less stomach upset than non-enteric-coated aspirin. This type of aspirin should not be chewed or crushed. Work with your health care provider to find out whether it is safe and beneficial for you to take aspirin daily. Taking aspirin daily may be helpful if: You have had a heart attack or chest pain, or you are at risk for a heart attack. You have a condition in which certain heart vessels are blocked (coronary artery disease), and you have had a procedure to treat it. Examples are: Open-heart surgery, such as coronary artery bypass surgery (CABG). Coronary angioplasty,which is done to widen a blood vessel of your heart. Having a small mesh tube, or stent, placed in your coronary artery. You have had certain types of stroke or a mini-stroke known as a transient ischemic attack (TIA). You have a narrowing of the arteries that supply the limbs (peripheral artery disease, or PAD). You have long-term (chronic) heart rhythm problems, such as atrial fibrillation, and your health care provider thinks aspirin may help. You have valve disease or have had surgery on a valve. You are considered at increased risk of developing coronary artery disease or PAD.    Follow these instructions at home Medicines Take over-the-counter and prescription medicines only as told by your health care provider. If you are taking blood thinners: Talk with your health care provider before you take any medicines that contain aspirin or NSAIDs, such as ibuprofen. These medicines increase your risk for  dangerous bleeding. Take your medicine exactly as told, at the same time every day. Avoid activities that could cause injury or bruising, and follow instructions about how to prevent falls. Wear a medical alert bracelet or carry a card that lists what medicines you take. General instructions Do not drink alcohol if: Your health care provider tells you not to drink. You are pregnant, may be pregnant, or are planning to become pregnant. If you drink alcohol: Limit how much you use to: 0-1 drink a day for women. 0-2 drinks a  day for men. Be aware of how much alcohol is in your drink. In the [place] one drink equals one 12 oz bottle of beer (355 mL), one 5 oz glass of wine (148 mL), or one 1 oz glass of hard liquor (44 mL). Keep all follow-up visits as told by your health care provider. This is important. Where to find more information The American Heart Association: www.heart.org Contact a health care provider if you have: Unusual bleeding or bruising. Stomach pain or nausea. Ringing in your ears. An allergic reaction that causes hives, itchy skin, or swelling of the lips, tongue, or face. Get help right away if: You notice that your bowel movements are bloody, or dark red or black in color. You vomit or cough up blood. You have blood in your urine. You cough, breathe loudly (wheeze), or feel short of breath. You have chest pain, especially if the pain spreads to your arms, back, neck, or jaw. You have a headache with confusion. You have any symptoms of a stroke. "BE FAST" is an easy way to remember the main warning signs of a stroke: B - Balance. Signs are dizziness, sudden trouble walking, or loss of balance. E - Eyes. Signs are trouble seeing or a sudden change in vision. F - Face. Signs are sudden weakness or numbness of the face, or the face or eyelid drooping on one side. A - Arms. Signs are weakness or numbness in an arm. This happens suddenly and usually on one side of the  body. S - Speech. Signs are sudden trouble speaking, slurred speech, or trouble understanding what people say. T - Time. Time to call emergency services. Write down what time symptoms started. You have other signs of a stroke, such as: A sudden, severe headache with no known cause. Nausea or vomiting. Seizure. These symptoms may represent a serious problem that is an emergency. Do not wait to see if the symptoms will go away. Get medical help right away. Call your local emergency services (911 in the [place] Do not drive yourself to the hospital. Summary Aspirin use can help reduce the risk of blood clots, heart attacks, and other heart-related problems. Daily use of aspirin can cause side effects. Take aspirin only as told by your health care provider. Make sure that you understand how much to take and what form to take. Your health care provider will help you determine whether it is safe and beneficial for you to take aspirin daily. This information is not intended to replace advice given to you by your health care provider. Make sure you discuss any questions you have with your health care provider. Document Revised: 06/23/2019 Document Reviewed: 06/23/2019 Elsevier Patient Education  2021 Elsevier Inc. Nitroglycerin sublingual tablets What is this medicine? NITROGLYCERIN (nye troe GLI ser in) is a type of vasodilator. It relaxes blood vessels, increasing the blood and oxygen supply to your heart. This medicine is used to relieve chest pain caused by angina. It is also used to prevent chest pain before activities like climbing stairs, going outdoors in cold weather, or sexual activity. This medicine may be used for other purposes; ask your health care provider or pharmacist if you have questions. COMMON BRAND NAME(S): Nitroquick, Nitrostat, Nitrotab What should I tell my health care provider before I take this medicine? They need to know if you have any of these conditions: anemia head  injury, recent stroke, or bleeding in the brain liver disease previous heart attack an unusual or allergic reaction to  nitroglycerin, other medicines, foods, dyes, or preservatives pregnant or trying to get pregnant breast-feeding How should I use this medicine? Take this medicine by mouth as needed. Use at the first sign of an angina attack (chest pain or tightness). You can also take this medicine 5 to 10 minutes before an event likely to produce chest pain. Follow the directions exactly as written on the prescription label. Place one tablet under your tongue and let it dissolve. Do not swallow whole. Replace the dose if you accidentally swallow it. It will help if your mouth is not dry. Saliva around the tablet will help it to dissolve more quickly. Do not eat or drink, smoke or chew tobacco while a tablet is dissolving. Sit down when taking this medicine. In an angina attack, you should feel better within 5 minutes after your first dose. You can take a dose every 5 minutes up to a total of 3 doses. If you do not feel better or feel worse after 1 dose, call 9-1-1 at once. Do not take more than 3 doses in 15 minutes. Your health care provider might give you other directions. Follow those directions if he or she does. Do not take your medicine more often than directed. Talk to your health care provider about the use of this medicine in children. Special care may be needed. Overdosage: If you think you have taken too much of this medicine contact a poison control center or emergency room at once. NOTE: This medicine is only for you. Do not share this medicine with others. What if I miss a dose? This does not apply. This medicine is only used as needed. What may interact with this medicine? Do not take this medicine with any of the following medications: certain migraine medicines like ergotamine and dihydroergotamine (DHE) medicines used to treat erectile dysfunction like sildenafil, tadalafil, and  vardenafil riociguat This medicine may also interact with the following medications: alteplase aspirin heparin medicines for high blood pressure medicines for mental depression other medicines used to treat angina phenothiazines like chlorpromazine, mesoridazine, prochlorperazine, thioridazine This list may not describe all possible interactions. Give your health care provider a list of all the medicines, herbs, non-prescription drugs, or dietary supplements you use. Also tell them if you smoke, drink alcohol, or use illegal drugs. Some items may interact with your medicine. What should I watch for while using this medicine? Tell your doctor or health care professional if you feel your medicine is no longer working. Keep this medicine with you at all times. Sit or lie down when you take your medicine to prevent falling if you feel dizzy or faint after using it. Try to remain calm. This will help you to feel better faster. If you feel dizzy, take several deep breaths and lie down with your feet propped up, or bend forward with your head resting between your knees. You may get drowsy or dizzy. Do not drive, use machinery, or do anything that needs mental alertness until you know how this drug affects you. Do not stand or sit up quickly, especially if you are an older patient. This reduces the risk of dizzy or fainting spells. Alcohol can make you more drowsy and dizzy. Avoid alcoholic drinks. Do not treat yourself for coughs, colds, or pain while you are taking this medicine without asking your doctor or health care professional for advice. Some ingredients may increase your blood pressure. What side effects may I notice from receiving this medicine? Side effects that you should  report to your doctor or health care professional as soon as possible: allergic reactions (skin rash, itching or hives; swelling of the face, lips, or tongue) low blood pressure (dizziness; feeling faint or lightheaded,  falls; unusually weak or tired) low red blood cell counts (trouble breathing; feeling faint; lightheaded, falls; unusually weak or tired) Side effects that usually do not require medical attention (report to your doctor or health care professional if they continue or are bothersome): facial flushing (redness) headache nausea, vomiting This list may not describe all possible side effects. Call your doctor for medical advice about side effects. You may report side effects to FDA at 1-800-FDA-1088. Where should I keep my medicine? Keep out of the reach of children. Store at room temperature between 20 and 25 degrees C (68 and 77 degrees F). Store in Retail buyer. Protect from light and moisture. Keep tightly closed. Throw away any unused medicine after the expiration date. NOTE: This sheet is a summary. It may not cover all possible information. If you have questions about this medicine, talk to your doctor, pharmacist, or health care provider.  2021 Elsevier/Gold Standard (2018-06-19 16:46:32)

## 2023-04-04 ENCOUNTER — Ambulatory Visit (HOSPITAL_COMMUNITY)
Admission: RE | Admit: 2023-04-04 | Discharge: 2023-04-04 | Disposition: A | Discharge status: Home / Self Care | Payer: BC Managed Care – PPO | Source: Ambulatory Visit | Attending: Interventional Cardiology | Admitting: Interventional Cardiology

## 2023-04-04 ENCOUNTER — Other Ambulatory Visit: Payer: Self-pay

## 2023-04-04 ENCOUNTER — Encounter (HOSPITAL_COMMUNITY)
Admission: RE | Disposition: A | Discharge status: Home / Self Care | Payer: Self-pay | Source: Ambulatory Visit | Attending: Interventional Cardiology

## 2023-04-04 ENCOUNTER — Ambulatory Visit (HOSPITAL_BASED_OUTPATIENT_CLINIC_OR_DEPARTMENT_OTHER): Payer: BC Managed Care – PPO

## 2023-04-04 DIAGNOSIS — I4719 Other supraventricular tachycardia: Secondary | ICD-10-CM | POA: Diagnosis not present

## 2023-04-04 DIAGNOSIS — Z01810 Encounter for preprocedural cardiovascular examination: Secondary | ICD-10-CM | POA: Diagnosis not present

## 2023-04-04 DIAGNOSIS — E782 Mixed hyperlipidemia: Secondary | ICD-10-CM | POA: Insufficient documentation

## 2023-04-04 DIAGNOSIS — I25118 Atherosclerotic heart disease of native coronary artery with other forms of angina pectoris: Secondary | ICD-10-CM | POA: Insufficient documentation

## 2023-04-04 DIAGNOSIS — I25119 Atherosclerotic heart disease of native coronary artery with unspecified angina pectoris: Secondary | ICD-10-CM

## 2023-04-04 DIAGNOSIS — I493 Ventricular premature depolarization: Secondary | ICD-10-CM | POA: Diagnosis not present

## 2023-04-04 DIAGNOSIS — I2510 Atherosclerotic heart disease of native coronary artery without angina pectoris: Secondary | ICD-10-CM

## 2023-04-04 DIAGNOSIS — F419 Anxiety disorder, unspecified: Secondary | ICD-10-CM | POA: Insufficient documentation

## 2023-04-04 DIAGNOSIS — Z79899 Other long term (current) drug therapy: Secondary | ICD-10-CM | POA: Diagnosis not present

## 2023-04-04 DIAGNOSIS — I119 Hypertensive heart disease without heart failure: Secondary | ICD-10-CM | POA: Diagnosis not present

## 2023-04-04 HISTORY — PX: LEFT HEART CATH AND CORONARY ANGIOGRAPHY: CATH118249

## 2023-04-04 LAB — COMPREHENSIVE METABOLIC PANEL WITH GFR
ALT: 14 IU/L (ref 0–44)
AST: 17 IU/L (ref 0–40)
Albumin: 4.8 g/dL (ref 3.8–4.9)
Alkaline Phosphatase: 158 IU/L — ABNORMAL HIGH (ref 44–121)
BUN/Creatinine Ratio: 12 (ref 9–20)
BUN: 14 mg/dL (ref 6–24)
Bilirubin Total: 1 mg/dL (ref 0.0–1.2)
CO2: 23 mmol/L (ref 20–29)
Calcium: 9.8 mg/dL (ref 8.7–10.2)
Chloride: 99 mmol/L (ref 96–106)
Creatinine, Ser: 1.17 mg/dL (ref 0.76–1.27)
Globulin, Total: 2.4 g/dL (ref 1.5–4.5)
Glucose: 95 mg/dL (ref 70–99)
Potassium: 3.9 mmol/L (ref 3.5–5.2)
Sodium: 137 mmol/L (ref 134–144)
Total Protein: 7.2 g/dL (ref 6.0–8.5)
eGFR: 75 mL/min/1.73

## 2023-04-04 LAB — CBC
Hematocrit: 47.3 % (ref 37.5–51.0)
Hemoglobin: 16.2 g/dL (ref 13.0–17.7)
MCH: 30.4 pg (ref 26.6–33.0)
MCHC: 34.2 g/dL (ref 31.5–35.7)
MCV: 89 fL (ref 79–97)
Platelets: 363 x10E3/uL (ref 150–450)
RBC: 5.33 x10E6/uL (ref 4.14–5.80)
RDW: 13.1 % (ref 11.6–15.4)
WBC: 7.9 x10E3/uL (ref 3.4–10.8)

## 2023-04-04 LAB — ECHOCARDIOGRAM COMPLETE
AR max vel: 2.48 cm2
AV Peak grad: 4.7 mmHg
Ao pk vel: 1.08 m/s
Area-P 1/2: 3.5 cm2
S' Lateral: 3.4 cm

## 2023-04-04 SURGERY — LEFT HEART CATH AND CORONARY ANGIOGRAPHY
Anesthesia: LOCAL

## 2023-04-04 MED ORDER — LIDOCAINE HCL (PF) 1 % IJ SOLN
INTRAMUSCULAR | Status: DC | PRN
  Administered 2023-04-04: 2 mL

## 2023-04-04 MED ORDER — SODIUM CHLORIDE 0.9 % IV SOLN: 250.0000 mL | INTRAVENOUS | Status: DC | PRN

## 2023-04-04 MED ORDER — SODIUM CHLORIDE 0.9% FLUSH: 3.0000 mL | Freq: Two times a day (BID) | INTRAVENOUS | Status: DC

## 2023-04-04 MED ORDER — SODIUM CHLORIDE 0.9 % IV SOLN: INTRAVENOUS | Status: AC

## 2023-04-04 MED ORDER — MIDAZOLAM HCL 2 MG/2ML IJ SOLN
INTRAMUSCULAR | Status: AC
  Filled 2023-04-04: qty 2

## 2023-04-04 MED ORDER — FENTANYL CITRATE (PF) 100 MCG/2ML IJ SOLN
INTRAMUSCULAR | Status: AC
  Filled 2023-04-04: qty 2

## 2023-04-04 MED ORDER — HEPARIN (PORCINE) IN NACL 1000-0.9 UT/500ML-% IV SOLN
INTRAVENOUS | Status: DC | PRN
  Administered 2023-04-04 (×2): 500 mL via INTRA_ARTERIAL

## 2023-04-04 MED ORDER — VERAPAMIL HCL 2.5 MG/ML IV SOLN
INTRAVENOUS | Status: AC
  Filled 2023-04-04: qty 2

## 2023-04-04 MED ORDER — HYDRALAZINE HCL 20 MG/ML IJ SOLN: 10.0000 mg | INTRAMUSCULAR | Status: DC | PRN

## 2023-04-04 MED ORDER — LABETALOL HCL 5 MG/ML IV SOLN: 10.0000 mg | INTRAVENOUS | Status: DC | PRN

## 2023-04-04 MED ORDER — FENTANYL CITRATE (PF) 100 MCG/2ML IJ SOLN
INTRAMUSCULAR | Status: DC | PRN
  Administered 2023-04-04 (×3): 25 ug via INTRAVENOUS

## 2023-04-04 MED ORDER — SODIUM CHLORIDE 0.9% FLUSH: 3.0000 mL | INTRAVENOUS | Status: DC | PRN

## 2023-04-04 MED ORDER — VERAPAMIL HCL 2.5 MG/ML IV SOLN
INTRAVENOUS | Status: DC | PRN
  Administered 2023-04-04 (×2): 10 mL via INTRA_ARTERIAL

## 2023-04-04 MED ORDER — ACETAMINOPHEN 325 MG PO TABS: 650.0000 mg | ORAL_TABLET | ORAL | Status: DC | PRN

## 2023-04-04 MED ORDER — SODIUM CHLORIDE 0.9 % WEIGHT BASED INFUSION: 1.0000 mL/kg/h | INTRAVENOUS | Status: DC

## 2023-04-04 MED ORDER — ASPIRIN 81 MG PO CHEW: 81.0000 mg | CHEWABLE_TABLET | ORAL | Status: DC

## 2023-04-04 MED ORDER — MIDAZOLAM HCL 2 MG/2ML IJ SOLN
INTRAMUSCULAR | Status: DC | PRN
  Administered 2023-04-04: 1 mg via INTRAVENOUS
  Administered 2023-04-04: 2 mg via INTRAVENOUS
  Administered 2023-04-04: 1 mg via INTRAVENOUS

## 2023-04-04 MED ORDER — HEPARIN SODIUM (PORCINE) 1000 UNIT/ML IJ SOLN
INTRAMUSCULAR | Status: AC
  Filled 2023-04-04: qty 10

## 2023-04-04 MED ORDER — HEPARIN SODIUM (PORCINE) 1000 UNIT/ML IJ SOLN
INTRAMUSCULAR | Status: DC | PRN
  Administered 2023-04-04: 5000 [IU] via INTRAVENOUS

## 2023-04-04 MED ORDER — SODIUM CHLORIDE 0.9 % WEIGHT BASED INFUSION
3.0000 mL/kg/h | INTRAVENOUS | Status: AC
  Administered 2023-04-04: 3 mL/kg/h via INTRAVENOUS

## 2023-04-04 MED ORDER — ONDANSETRON HCL 4 MG/2ML IJ SOLN
4.0000 mg | Freq: Four times a day (QID) | INTRAMUSCULAR | Status: DC | PRN
  Filled 2023-04-04: qty 2

## 2023-04-04 MED ORDER — LIDOCAINE HCL (PF) 1 % IJ SOLN
INTRAMUSCULAR | Status: AC
  Filled 2023-04-04: qty 30

## 2023-04-04 MED ORDER — IOHEXOL 350 MG/ML SOLN
INTRAVENOUS | Status: DC | PRN
  Administered 2023-04-04: 65 mL via INTRA_ARTERIAL

## 2023-04-04 SURGICAL SUPPLY — 9 items
CATH 5FR JL3.5 JR4 ANG PIG MP (CATHETERS) IMPLANT
DEVICE RAD COMP TR BAND LRG (VASCULAR PRODUCTS) IMPLANT
GLIDESHEATH SLEND SS 6F .021 (SHEATH) IMPLANT
GUIDEWIRE INQWIRE 1.5J.035X260 (WIRE) IMPLANT
INQWIRE 1.5J .035X260CM (WIRE) ×1
KIT HEART LEFT (KITS) ×1 IMPLANT
PACK CARDIAC CATHETERIZATION (CUSTOM PROCEDURE TRAY) ×1 IMPLANT
TRANSDUCER W/STOPCOCK (MISCELLANEOUS) ×1 IMPLANT
TUBING CIL FLEX 10 FLL-RA (TUBING) ×1 IMPLANT

## 2023-04-04 NOTE — Interval H&P Note (Signed)
Cath Lab Visit (complete for each Cath Lab visit)  Clinical Evaluation Leading to the Procedure:   ACS: No.  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    History and Physical Interval Note:  04/04/2023 8:27 AM  Scott Mcguire  has presented today for surgery, with the diagnosis of abnormal cardiac ct.  The various methods of treatment have been discussed with the patient and family. After consideration of risks, benefits and other options for treatment, the patient has consented to  Procedure(s): LEFT HEART CATH AND CORONARY ANGIOGRAPHY (N/A) as a surgical intervention.  The patient's history has been reviewed, patient examined, no change in status, stable for surgery.  I have reviewed the patient's chart and labs.  Questions were answered to the patient's satisfaction.     Lance Muss

## 2023-04-04 NOTE — Discharge Instructions (Signed)
Drink plenty of fluids for 48 hours and keep wrist elevated at heart level for 24 hours  Radial Site Care   This sheet gives you information about how to care for yourself after your procedure. Your health care provider may also give you more specific instructions. If you have problems or questions, contact your health care provider. What can I expect after the procedure? After the procedure, it is common to have: Bruising and tenderness at the catheter insertion area. Follow these instructions at home: Medicines Take over-the-counter and prescription medicines only as told by your health care provider. Insertion site care Follow instructions from your health care provider about how to take care of your insertion site. Make sure you: Wash your hands with soap and water before you change your bandage (dressing). If soap and water are not available, use hand sanitizer. Remove your dressing 12 NOON TOMORROW Check your insertion site every day for signs of infection. Check for: Redness, swelling, or pain. Fluid or blood. Pus or a bad smell. Warmth. Do not take baths, swim, or use a hot tub until your health care provider approves. You may shower AFTER REMOVING HE DRESSING Remove the dressing and gently wash the site with plain soap and water. Pat the area dry with a clean towel. Do not rub the site. That could cause bleeding. Do not apply powder or lotion to the site. Activity   For 24 hours after the procedure, or as directed by your health care provider: Do not flex or bend the affected arm. Do not push or pull heavy objects with the affected arm. Do not drive yourself home from the hospital or clinic. You may drive 24 hours after the procedure unless your health care provider tells you not to. Do not operate machinery or power tools. Do not lift anything that is heavier than 10 lb (4.5 kg), or the limit that you are told, until your health care provider says that it is safe.  For 4  days Ask your health care provider when it is okay to: Return to work or school. Resume usual physical activities or sports. Resume sexual activity. General instructions If the catheter site starts to bleed, raise your arm, put firm pressure on the site for 15 minutes. If the bleeding does not stop, CALL 911.This is a medical emergency. If you went home on the same day as your procedure, a responsible adult should be with you for the first 24 hours after you arrive home. Keep all follow-up visits as told by your health care provider. This is important. Contact a health care provider if: You have a fever. You have redness, swelling, or yellow drainage around your insertion site. Get help right away if: You have unusual pain at the radial site. The catheter insertion area swells very fast, raise your arm, put firm pressure on the site for 15 minutes. If the swelling does not stop, CALL 911.This is a medical emergency. The insertion area is bleeding, raise your arm, put firm pressure on the site for 15 minutes. If the bleeding does not stop, CALL 911.This is a medical emergency. Your arm or hand becomes pale, cool, tingly, or numb. These symptoms may represent a serious problem that is an emergency. Do not wait to see if the symptoms will go away. Get medical help right away. Call your local emergency services (911 in the [place] Do not drive yourself to the hospital. Summary After the procedure, it is common to have bruising and tenderness  at the site. Follow instructions from your health care provider about how to take care of your radial site wound. Check the wound every day for signs of infection. Do not lift anything that is heavier than 10 lb (4.5 kg), or the limit that you are told, until your health care provider says that it is safe. This information is not intended to replace advice given to you by your health care provider. Make sure you discuss any questions you have with your health  care provider. Document Revised: 10/24/2017 Document Reviewed: 10/24/2017 [employer]vier Patient Education  2020 [employer].

## 2023-04-05 LAB — LIPOPROTEIN A (LPA): Lipoprotein (a): 91.9 nmol/L — ABNORMAL HIGH

## 2023-04-05 LAB — LDL CHOLESTEROL, DIRECT: LDL Direct: 99 mg/dL (ref 0–99)

## 2023-04-06 ENCOUNTER — Encounter (HOSPITAL_COMMUNITY): Payer: Self-pay | Admitting: Interventional Cardiology

## 2023-04-09 ENCOUNTER — Telehealth: Payer: Self-pay | Admitting: *Deleted

## 2023-04-09 NOTE — Telephone Encounter (Signed)
Scott Bamberg NP called pt and talked with him extensively about upcoming surgery. She addressed all of his concerns and he had no further questions.

## 2023-04-09 NOTE — Telephone Encounter (Signed)
-----   Message from Flossie Dibble, NP sent at 04/08/2023  5:44 PM EDT ----- Can we touch base with him to see if surgery contacted him? They could not cath him and Dr. Eldridge Dace was going to consult surgery, but there are not notes and not even a d/c summary yet. I just need to know if we need to send a referral to [facility].

## 2023-04-13 ENCOUNTER — Institutional Professional Consult (permissible substitution) (INDEPENDENT_AMBULATORY_CARE_PROVIDER_SITE_OTHER): Payer: BC Managed Care – PPO | Admitting: Thoracic Surgery (Cardiothoracic Vascular Surgery)

## 2023-04-13 ENCOUNTER — Encounter: Payer: Self-pay | Admitting: *Deleted

## 2023-04-13 ENCOUNTER — Other Ambulatory Visit: Payer: Self-pay | Admitting: *Deleted

## 2023-04-13 VITALS — BP 138/100 | HR 66 | Resp 20 | Ht 70.0 in | Wt 215.0 lb

## 2023-04-13 DIAGNOSIS — R799 Abnormal finding of blood chemistry, unspecified: Secondary | ICD-10-CM

## 2023-04-13 DIAGNOSIS — I2510 Atherosclerotic heart disease of native coronary artery without angina pectoris: Secondary | ICD-10-CM | POA: Diagnosis not present

## 2023-04-13 DIAGNOSIS — R079 Chest pain, unspecified: Secondary | ICD-10-CM

## 2023-04-15 NOTE — Progress Notes (Signed)
Cardiology Office Note:    Date:  04/17/2023   ID:  Scott Mcguire, DOB 1971/06/04, MRN [id number]  PCP:  Galvin Proffer, MD   [facility] Providers Cardiologist:  Thomasene Ripple, DO     Referring MD: Galvin Proffer, MD    History of Present Illness:    Scott Mcguire is a 52 y.o. 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  He wore a monitor in September 2021 which revealed an average heart rate of 83 bpm, predominant underlying rhythm was sinus, 2 episodes of SVT, all triggered events were associated with PVCs. Echocardiogram in August 2021 revealed an EF of 55 to 60%, mild concentric LVH, trace MR, mild TR.  Coronary CTA in 2022 the calcium score of 8.6, minimal CAD.  Evaluated on 03/16/2023 for evaluation of chest pain and DOE.  Chest pain had mixed features could occur at rest or with exertion. We arranged a coronary CTA which revealed a coronary calcium score of 43.8, placing him in the 77th percentile, FFR suggested flow-limiting stenosis in the LAD and left circumflex.  We arranged left heart cath and this was completed on 04/04/2023 revealing multivessel CAD, proximal LAD to mid LAD with 70% stenosed, ostial LAD to proximal LAD was 50% stenosed, mid LAD lesion 90% stenosed, ramus 1 lesion 80% stenosed.  He was evaluated by Dr. Cliffton Asters with CVTS on 04/13/2023, with plans to undergo CABG in the near future.  He presents today accompanied by his wife for follow-up of his CAD.  This been very hard diagnosis for him to except.  Tentative plans are to undergo three-vessel CABG on 05/16/2023.  He has a very demanding, high stress job that requires him to arrange several things before he can proceed with this necessary surgery.  He did have an episode last week where he needed to take his nitroglycerin, he is not sure if it was true chest pain but he did  feel breathless, also felt like he was having a panic attack.  Was evaluated by his PCP and advised to start citalopram however he is very hesitant to take this.  He continues to have some ongoing DOE.  He has made significant lifestyle changes regarding his dietary intake and is down approximately 10 pounds. He denies chest pain, palpitations, dyspnea, pnd, orthopnea, n, v, dizziness, syncope, edema, weight gain, or early satiety.     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   aspirin EC 81 MG tablet Take 81 mg by mouth daily. Swallow whole.   atorvastatin (LIPITOR) 80 MG tablet Take 80 mg by mouth daily.   cetirizine (ZYRTEC) 5 MG chewable  tablet Chew 5 mg by mouth daily.   diltiazem (CARDIZEM CD) 240 MG 24 hr capsule Take 1 capsule (240 mg total) by mouth daily.   ezetimibe (ZETIA) 10 MG tablet Take 1 tablet (10 mg total) by mouth at bedtime.   hydrochlorothiazide (MICROZIDE) 12.5 MG capsule Take 1 capsule (12.5 mg total) by mouth daily.   isosorbide mononitrate (IMDUR) 30 MG 24 hr tablet Take 1 tablet (30 mg total) by mouth daily.   nitroGLYCERIN (NITROSTAT) 0.4 MG SL tablet Place 1 tablet (0.4 mg total) under the tongue every 5 (five) minutes as needed for chest pain.    omeprazole (PRILOSEC) 40 MG capsule Take 40 mg by mouth daily.   propranolol (INDERAL) 20 MG tablet Take one tablet by mouth as needed if heart rate greater then 120 (Patient taking differently: Take 20 mg by mouth See admin instructions. Take one tablet by mouth as needed if heart rate greater then 120)     Allergies:   Cefuroxime and Cefuroxime axetil   Social History   Socioeconomic History   Marital status: Married    Spouse name: Not on file   Number of children: Not on file   Years of education: Not on file   Highest education level: Not on file  Occupational History   Not on file  Tobacco Use   Smoking status: Never   Smokeless tobacco: Never  Substance and Sexual Activity   Alcohol use: Never   Drug use: Never   Sexual activity: Not on file  Other Topics Concern   Not on file  Social History Narrative   Not on file   Social Determinants of Health   Financial Resource Strain: Not on file  Food Insecurity: Not on file  Transportation Needs: Not on file  Physical Activity: Not on file  Stress: Not on file  Social Connections: Not on file     Family History: The patient's family history includes Heart disease in his father and mother.  ROS:   Please see the history of present illness.     All other systems reviewed and are negative.  EKGs/Labs/Other Studies Reviewed:   Cardiac Studies & Procedures   CARDIAC CATHETERIZATION  CARDIAC CATHETERIZATION 04/04/2023  Narrative   Prox LAD to Mid LAD lesion is 70% stenosed.   Ost LAD to Prox LAD lesion is 50% stenosed.   Mid LAD lesion is 90% stenosed.   Ramus-1 lesion is 80% stenosed.   Ramus-2 lesion is 50% stenosed.   Ost Cx lesion is 40% stenosed.   Mid Cx lesion is 50% stenosed.   The left ventricular systolic function is normal.   LV end diastolic pressure is normal.   The left ventricular ejection fraction is 55-65% by visual estimate.   There is no aortic valve stenosis.   In the absence of any other  complications or medical issues, we expect the patient to be ready for discharge from a cath perspective on 04/04/2023.  Multivessel coronary artery disease with the most severe disease being in the mid LAD and the ostial ramus branch.  There is another significant lesion in the proximal LAD.  The ostial to proximal LAD has diffuse, moderate disease.  There is moderate disease in the mid circumflex.  Cath works FFR was performed which showed that the circumflex disease was not significant. LAD FFR dropped to 0.72 in the mid vessel and 0.69 after all of the significant disease. The ramus FFR dropped to 0.83 after the initial lesion and to  0.77 in the mid vessel. Given the moderate LAD disease in the proximal vessel extends all the way back to the left main, landing the stent with strut hanging in the left main may further compromise the ramus vessel.  It would be a long area of stent.  Will obtain cardiac surgical consult for possible bypass. One option would be hybrid option with LIMA to LAD (would check with surgery if robotic option is available).  Consider medical therapy of the ramus and if symptoms persisted, could pursue PCI of the ostial ramus.  Since the LAD would be protected, this would be feasible.  There may be some shift into the circumflex vessel but this is a relatively small vessel.  He will need aggressive medical therapy including aspirin and statin.  Findings Coronary Findings Diagnostic  Dominance: Right  Left Anterior Descending Ost LAD to Prox LAD lesion is 50% stenosed. Prox LAD to Mid LAD lesion is 70% stenosed. Mid LAD lesion is 90% stenosed.  Ramus Intermedius Vessel is large. Ramus-1 lesion is 80% stenosed. Ramus-2 lesion is 50% stenosed.  Left Circumflex Ost Cx lesion is 40% stenosed. Mid Cx lesion is 50% stenosed.  Right Coronary Artery There is mild diffuse disease throughout the vessel.  Intervention  No interventions have been documented.   STRESS  TESTS  MYOCARDIAL PERFUSION IMAGING 06/15/2020   ECHOCARDIOGRAM  ECHOCARDIOGRAM COMPLETE 04/04/2023  Narrative ECHOCARDIOGRAM REPORT    Patient Name:   Scott Mcguire Date of Exam: 04/04/2023 Medical Rec #:  [id number]             Height:       70.0 in Accession #:    [id number]            Weight:       218.0 lb Date of Birth:  1970/11/11              BSA:          2.165 m Patient Age:    52 years              BP:           125/85 mmHg Patient Gender: M                     HR:           61 bpm. Exam Location:  Inpatient  Procedure: 2D Echo, Color Doppler and Cardiac Doppler  Indications:    Pre-Op  History:        Patient has no prior history of Echocardiogram examinations. CAD, Arrythmias:Tachycardia; Risk Factors:Hypertension.  Sonographer:    Darlys Gales Referring Phys: [id number] Perlie Gold  IMPRESSIONS   1. Left ventricular ejection fraction, by estimation, is 60 to 65%. The left ventricle has normal function. The left ventricle has no regional wall motion abnormalities. There is mild asymmetric left ventricular hypertrophy of the basal-septal segment. Left ventricular diastolic parameters are indeterminate. 2. Right ventricular systolic function is normal. The right ventricular size is normal. Tricuspid regurgitation signal is inadequate for assessing PA pressure. 3. The mitral valve is normal in structure. Trivial mitral valve regurgitation. 4. The aortic valve is tricuspid. Aortic valve regurgitation is not visualized. No aortic stenosis is present.  FINDINGS Left Ventricle: Left ventricular ejection fraction, by estimation, is 60 to 65%. The left ventricle has normal function. The left ventricle has no regional wall motion abnormalities. The left ventricular internal cavity size was normal in size. There is mild asymmetric left  ventricular hypertrophy of the basal-septal segment. Left ventricular diastolic parameters are indeterminate.  Right Ventricle: The right  ventricular size is normal. No increase in right ventricular wall thickness. Right ventricular systolic function is normal. Tricuspid regurgitation signal is inadequate for assessing PA pressure.  Left Atrium: Left atrial size was normal in size.  Right Atrium: Right atrial size was normal in size.  Pericardium: There is no evidence of pericardial effusion.  Mitral Valve: The mitral valve is normal in structure. Trivial mitral valve regurgitation.  Tricuspid Valve: The tricuspid valve is normal in structure. Tricuspid valve regurgitation is trivial.  Aortic Valve: The aortic valve is tricuspid. Aortic valve regurgitation is not visualized. No aortic stenosis is present. Aortic valve peak gradient measures 4.7 mmHg.  Pulmonic Valve: The pulmonic valve was not well visualized. Pulmonic valve regurgitation is not visualized.  Aorta: The aortic root and ascending aorta are structurally normal, with no evidence of dilitation.  IAS/Shunts: The interatrial septum was not well visualized.   LEFT VENTRICLE PLAX 2D LVIDd:         5.30 cm LVIDs:         3.40 cm LV PW:         1.10 cm LV IVS:        1.20 cm LVOT diam:     2.00 cm LVOT Area:     3.14 cm   RIGHT VENTRICLE RV S prime:     14.80 cm/s TAPSE (M-mode): 3.1 cm  LEFT ATRIUM           Index LA Vol (A2C): 20.3 ml 9.38 ml/m LA Vol (A4C): 61.8 ml 28.54 ml/m AORTIC VALVE AV Area (Vmax): 2.48 cm AV Vmax:        108.00 cm/s AV Peak Grad:   4.7 mmHg LVOT Vmax:      85.40 cm/s  MITRAL VALVE MV Area (PHT): 3.50 cm     SHUNTS MV Decel Time: 217 msec     Systemic Diam: 2.00 cm MV E velocity: 79.70 cm/s MV A velocity: 104.00 cm/s MV E/A ratio:  0.77  [name] MD Electronically signed by [name] MD Signature Date/Time: 04/04/2023/1:48:20 PM    Final    MONITORS  LONG TERM MONITOR (3-14 DAYS) 07/07/2020  Narrative The patient wore the monitor for 3 days 1 hour starting 06/26/2020. Indication:  Palpitations  The minimum heart rate was 52 bpm, maximum heart rate was 196 bpm, and average heart rate was 83 bpm. Predominant underlying rhythm was Sinus Rhythm.  2 Supraventricular Tachycardia runs occurred, the run with the fastest interval lasting 5 beats with a maximum rate of 171 bpm (average 146 bpm); the run with the fastest interval was also the longest.  Premature atrial complexes were rare. Premature Ventricular complexes were occasional (1.0%, 3730).  No ventricular tachycardia, no pauses, No AV block and no atrial fibrillation present. 3 patient triggered events all associated with premature ventricular complex. 5 diary events 3 associated with premature ventricular complex and the remaining with sinus rhythm.  Conclusion: This study is remarkable for the following: 1. Rare asymptomatic atrial tachycardia with variable block. 2. Occasional premature ventricular complexes.   CT SCANS  CT CORONARY MORPH W/CTA COR W/SCORE 03/29/2023  Addendum 04/05/2023  1:44 PM ADDENDUM REPORT: 04/05/2023 13:41  EXAM: OVER-READ INTERPRETATION  CT CHEST  The following report is an over-read performed by radiologist Dr. Elnoria Howard [facility] on 04/05/2023. This over-read does not include interpretation of cardiac or coronary anatomy or pathology. The cardiovascular  interpretation by the cardiologist is attached.  COMPARISON:  11/09/2020  FINDINGS: No enlarged lymph nodes. The included lungs are clear with no nodules or pleural fluid seen. Thoracic spine degenerative changes. Unremarkable upper abdomen.  IMPRESSION: No significant extracardiac findings.   Electronically Signed By: Beckie Salts M.D. On: 04/05/2023 13:41  Narrative CLINICAL DATA:  52 yo male with chest pain  EXAM: Cardiac/Coronary CTA  TECHNIQUE: A non-contrast, gated CT scan was obtained with axial slices of 3 mm through the heart for calcium scoring. Calcium scoring was performed using the  Agatston method. A 120 kV prospective, gated, contrast cardiac scan was obtained. Gantry rotation speed was 250 msecs and collimation was 0.6 mm. Two sublingual nitroglycerin tablets (0.8 mg) were given. The 3D data set was reconstructed in 5% intervals of the 35-75% of the R-R cycle. Diastolic phases were analyzed on a dedicated workstation using MPR, MIP, and VRT modes. The patient received 95 cc of contrast.  FINDINGS: Image quality: Poor.  Noise artifact is: Moderate (misregistration; signal to noise).  Coronary Arteries:  Normal coronary origin.  Right dominance.  Left main: The left main is a large caliber vessel with a normal take off from the left coronary cusp that trifurcates into a LAD, LCX, and ramus intermedius. There is no plaque or stenosis.  Left anterior descending artery: The LAD has moderate (50-69) mixed plaque stenosis in the proximal vessel. The LAD gives off 2 small diagonal branches.  Ramus intermedius: Large, branching; moderate (50-69) stenosis in proximal, medial branch.  Left circumflex artery: The LCX is non-dominant and patent with severe (70-99) stenosis in the mid vessel. The LCX gives off 2 obtuse marginal branches not well visualized.  Right coronary artery: The RCA is dominant with normal take off from the right coronary cusp. There is no evidence of plaque or stenosis. The RCA terminates as a PDA and right posterolateral branch without evidence of plaque or stenosis.  Right Atrium: Right atrial size is within normal limits.  Right Ventricle: The right ventricular cavity is within normal limits.  Left Atrium: Left atrial size is normal in size with no left atrial appendage filling defect.  Left Ventricle: The ventricular cavity size is within normal limits. There are no stigmata of prior infarction. There is no abnormal filling defect.  Pulmonary arteries: Normal in size without proximal filling defect.  Pulmonary veins: Normal  pulmonary venous drainage.  Pericardium: Normal thickness with no significant effusion or calcium present.  Cardiac valves: The aortic valve is trileaflet without significant calcification. The mitral valve is normal structure without significant calcification.  Aorta: Normal caliber with mild aortic atherosclerosis.  Extra-cardiac findings: See attached radiology report for non-cardiac structures.  IMPRESSION: 1. Coronary calcium score of 43.8. This was 44 percentile for age-, sex, and race-matched controls.  2. Normal coronary origin with right dominance.  3. Severe (70-99) stenosis in the LCX and moderate (50-69) stenoses in the LAD and branch of the ramus intermedius.  4. Mild aortic atherosclerosis.  5. Study will be sent for FFR.  RECOMMENDATIONS: CAD-RADS 4: Severe stenosis. (70-99% or > 50% left main). Cardiac catheterization or CT FFR is recommended. Consider symptom-guided anti-ischemic pharmacotherapy as well as risk factor modification per guideline directed care. Invasive coronary angiography recommended with revascularization per published guideline statements.  Olga Millers, MD  Electronically Signed: By: Olga Millers M.D. On: 03/29/2023 18:55   CT SCANS  CT CORONARY MORPH W/CTA COR W/SCORE 11/09/2020  Addendum 11/10/2020  4:04 PM ADDENDUM REPORT: 11/10/2020 16:02  CLINICAL DATA:  This is a 52 year old male with chest pain  EXAM: Cardiac/Coronary  CT  TECHNIQUE: The patient was scanned on a [employer].  FINDINGS: A 120 kV prospective scan was triggered in the descending thoracic aorta at 111 HU's. Axial non-contrast 3 mm slices were carried out through the heart. The data set was analyzed on a dedicated work station and scored using the Agatson method. Gantry rotation speed was 250 msecs and collimation was .6 mm. No beta blockade and 0.8 mg of sl NTG was given. The 3D data set was reconstructed in 5% intervals of the 67-82 %  of the R-R cycle. Diastolic phases were analyzed on a dedicated work station using MPR, MIP and VRT modes. The patient received 80 cc of contrast.  Aorta: Normal size.  No calcifications.  No dissection.  Aortic Valve:  Trileaflet.  No calcifications.  Coronary calcium score 8.6.  Coronary Arteries:  Normal coronary origin.  Right dominance.  RCA is a large dominant artery that gives rise to PDA and PLVB. There is no plaque.  Left main is a large artery that gives rise to LAD and LCX arteries.  LAD is a large vessel. There is a minimal calcified plaque in the mid LAD. The proximal and distal LAD.  LCX is a non-dominant artery that gives rise to one large OM1 branch. There is no plaque. There is a stair-step artifact in the proximal LAD.  Other findings:  Normal pulmonary vein drainage into the left atrium.  Normal left atrial appendage without a thrombus.  Normal size of the pulmonary artery.  IMPRESSION: 1. Coronary calcium score of 8.6. This was 92 percentile for age and sex matched control.  2. Normal coronary origin with right dominance.  3. Minimal CAD. CADRADS 1. Aggressive medical therapy is recommended.  Thomasene Ripple, DO   Electronically Signed By: Thomasene Ripple DO On: 11/10/2020 16:02  Narrative EXAM: OVER-READ INTERPRETATION  CT CHEST  The following report is an over-read performed by radiologist Dr. Charlett Nose of [facility] on 11/09/2020. This over-read does not include interpretation of cardiac or coronary anatomy or pathology. The coronary CTA interpretation by the cardiologist is attached.  COMPARISON:  None.  FINDINGS: Vascular: Heart is normal size.  Aorta normal caliber.  Mediastinum/Nodes: No adenopathy  Lungs/Pleura: Visualized lungs clear.  No effusions.  Upper Abdomen: Imaging into the upper abdomen demonstrates no acute findings.  Musculoskeletal: Chest wall soft tissues are unremarkable. No acute bony  abnormality.  IMPRESSION: No acute or significant extracardiac abnormality.  Electronically Signed: By: Charlett Nose M.D. On: 11/09/2020 19:22           Recent Labs: 04/03/2023: ALT 14; BUN 14; Creatinine, Ser 1.17; Hemoglobin 16.2; Platelets 363; Potassium 3.9; Sodium 137  Recent Lipid Panel    Component Value Date/Time   LDLDIRECT 99 04/03/2023 1614     Risk Assessment/Calculations:                Physical Exam:    VS:  BP 116/78 (BP Location: Left Arm, Patient Position: Sitting)   Pulse 91   Ht 5\' 10"  (1.778 m)   Wt 210 lb 3.2 oz (95.3 kg)   SpO2 97%   BMI 30.16 kg/m     Wt Readings from Last 3 Encounters:  04/17/23 210 lb 3.2 oz (95.3 kg)  04/13/23 215 lb (97.5 kg)  04/03/23 218 lb (98.9 kg)     GEN:  Well nourished, well developed in  no acute distress HEENT: Normal NECK: No JVD; No carotid bruits LYMPHATICS: No lymphadenopathy CARDIAC: RRR, no murmurs, rubs, gallops RESPIRATORY:  Clear to auscultation without rales, wheezing or rhonchi  ABDOMEN: Soft, non-tender, non-distended MUSCULOSKELETAL:  No edema; No deformity  SKIN: Warm and dry NEUROLOGIC:  Alert and oriented x 3 PSYCHIATRIC:  Normal affect   ASSESSMENT:    1. Coronary artery disease involving native coronary artery of native heart without angina pectoris   2. Mixed hyperlipidemia   3. Essential hypertension   4. Elevated lipoprotein(a)     PLAN:    In order of problems listed above:  Coronary artery disease-three-vessel CAD per [facility].  Plans for CABG x 3 with Dr. Cliffton Asters on 05/16/2023.  Continue aspirin 81 mg daily, continue Lipitor 80 mg daily, continue Zetia 10 mg daily, continue nitroglycerin as needed.  He has some ongoing breathlessness, could be an anginal equivalent/deconditioning, we discussed starting Imdur and he is agreeable to that.  Will start Imdur 30 mg daily. Paroxysmal atrial tachycardia/symptomatic PVCs-quiescent.  Continue propranolol 20 mg as needed for heart rate  greater than 120. Hyperlipidemia -most recent LDL was elevated at 152, continue Lipitor 80 mg daily, continue Zetia 10 mg daily.  Will plan to repeat FLP and LFTs at his next OV. 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  Continuing with aggressive risk reduction per above. Anxiety-ongoing anxiety understandably in light of the surrounding circumstances.  He was evaluated by ups PCP and recommended that he start on citalopram however he is very hesitant to do so.  Encouraged him to consider at least for the perioperative timeframe.  Disposition -follow-up with general cardiology 3 weeks after surgery.      Medication Adjustments/Labs and Tests Ordered: Current medicines are reviewed at length with the patient today.  Concerns regarding medicines are outlined above.  No orders of the defined types were placed in this encounter.  Meds ordered this encounter  Medications   isosorbide mononitrate (IMDUR) 30 MG 24 hr tablet    Sig: Take 1 tablet (30 mg total) by mouth daily.    Dispense:  90 tablet    Refill:  3    Patient Instructions  Medication Instructions:  Your physician has recommended you make the following change in your medication:  Start Imdur 30 mg once daily  *If you need a refill on your cardiac medications before your next appointment, please call your pharmacy*   Lab Work: NONE If you have labs (blood work) drawn today and your tests are completely normal, you will receive your results only by: MyChart Message (if you have MyChart) OR A paper copy in the mail If you have any lab test that is abnormal or we need to change your treatment, we will call you to review the results.   Testing/Procedures: NONE   Follow-Up: At [facility], you and your health needs are our priority.  As part of our continuing mission to provide you with exceptional heart care, we have created designated Provider Care Teams.  These Care Teams include your primary  Cardiologist (physician) and Advanced Practice Providers (APPs -  Physician Assistants and Nurse Practitioners) who all work together to provide you with the care you need, when you need it.  We recommend signing up for the patient portal called "MyChart".  Sign up information is provided on this After Visit Summary.  MyChart is used to connect with patients for Virtual Visits (Telemedicine).  Patients are able to view lab/test results, encounter  notes, upcoming appointments, etc.  Non-urgent messages can be sent to your provider as well.   To learn more about what you can do with MyChart, go to [email or web address].    Your next appointment:   7 week(s)  Provider:   Wallis Bamberg, NP [facility])    Other Instructions     Signed, Flossie Dibble, NP  04/17/2023 4:47 PM    [facility]

## 2023-04-15 NOTE — Progress Notes (Signed)
[address]       [place] [address]             [phone]        Scott Mcguire [facility] Medical Record #[id number] Date of Birth: [birth year]  Referring: Corky Crafts, MD Primary Care: Galvin Proffer, MD Primary Cardiologist:Kardie Tobb, DO  Chief Complaint:    Chief Complaint  Patient presents with   Coronary Artery Disease    Cath and echo 7/3    History of Present Illness:     Scott Mcguire 52 y.o. 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  He is legally blind, and has had worsening shortness of breath, and exertional anginal symptoms.     Past Medical and Surgical History: Previous Chest Surgery: no Previous Chest Radiation: no Diabetes Mellitus: no.  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   Tachycardia     Past Surgical History:  Procedure Laterality Date   LEFT HEART CATH AND CORONARY ANGIOGRAPHY N/A 04/04/2023   Procedure: LEFT HEART CATH AND CORONARY ANGIOGRAPHY;  Surgeon: Corky Crafts, MD;  Location: [facility] INVASIVE CV LAB;  Service: Cardiovascular;  Laterality: N/A;   NO PAST SURGERIES      Social History:  Social History   Tobacco Use  Smoking Status Never  Smokeless Tobacco Never    Social History   Substance and Sexual Activity  Alcohol Use Never     Allergies  Allergen Reactions   Cefuroxime Swelling   Cefuroxime Axetil     Other reaction(s): Other (See Comments) Dyspnea, Throat swelling, Blisters in throat      Current Outpatient Medications  Medication Sig Dispense Refill   ALPRAZolam (XANAX) 1 MG tablet Take 1 mg by  mouth at bedtime as needed.      aspirin EC 81 MG tablet Take 81 mg by mouth daily. Swallow whole.     atorvastatin (LIPITOR) 80 MG tablet Take 80 mg by mouth daily.     cetirizine (ZYRTEC) 5 MG chewable tablet Chew 5 mg by mouth daily.     diltiazem (CARDIZEM CD) 240 MG 24 hr capsule Take 1 capsule (240 mg total) by mouth daily. 90 capsule 3   ezetimibe (ZETIA) 10 MG tablet Take 1 tablet (10 mg total) by mouth at bedtime. 90 tablet 3   hydrochlorothiazide (MICROZIDE) 12.5 MG capsule Take 1 capsule (12.5 mg total) by mouth daily. 15 capsule 0   nitroGLYCERIN (NITROSTAT) 0.4 MG SL tablet Place 1 tablet (0.4 mg total) under the tongue every 5 (five) minutes as needed for chest pain. 25 tablet 1   omeprazole (PRILOSEC) 40 MG capsule Take 40 mg by mouth daily.     propranolol (INDERAL) 20 MG tablet Take one tablet by mouth as needed if heart rate greater then 120 30 tablet 2   No current facility-administered medications for this visit.    (Not in a hospital admission)   Family History  Problem Relation Age of Onset   Heart disease Mother    Heart disease Father      Review of Systems:  Review of Systems  Constitutional:  Positive for malaise/fatigue.  Respiratory:  Positive for shortness of breath.   Cardiovascular:  Positive for chest pain.  Neurological: Negative.       Physical Exam: BP (!) 138/100 (BP Location: Left Arm, Patient Position: Sitting)   Pulse 66   Resp 20   Ht 5\' 10"  (1.778 m)   Wt 215 lb (97.5 kg)   SpO2 100% Comment: RA  BMI 30.85 kg/m  Physical Exam Constitutional:      General: He is not in acute distress.    Appearance: He is not ill-appearing.  HENT:     Head: Normocephalic and atraumatic.  Eyes:     Extraocular Movements: Extraocular movements intact.  Cardiovascular:     Rate and Rhythm: Normal rate.  Pulmonary:     Effort: Pulmonary effort is normal. No respiratory distress.  Musculoskeletal:        General: Normal range of motion.      Cervical back: Normal range of motion.  Skin:    General: Skin is warm and dry.  Neurological:     General: No focal deficit present.     Mental Status: He is alert and oriented to person, place, and time.        I have independently reviewed the above radiologic studies and discussed with the patient   Recent Lab Findings: Lab Results  Component Value Date   WBC 7.9 04/03/2023   HGB 16.2 04/03/2023   HCT 47.3 04/03/2023   PLT 363 04/03/2023   GLUCOSE 95 04/03/2023   LDLDIRECT 99 04/03/2023   ALT 14 04/03/2023   AST 17 04/03/2023   NA 137 04/03/2023   K 3.9 04/03/2023   CL 99 04/03/2023   CREATININE 1.17 04/03/2023   BUN 14 04/03/2023   CO2 23 04/03/2023      Assessment / Plan:   52yo male with 2V CAD, preserved biventricular function, and no significant valvular disease.  He has good targets for bypass.  There does not appear to be a good target on his lateral wall, but he has dominant ramus branch, and large diagonals.  We discussed the risks and benefits of surgical bypass.  He would like to proceed.  He needs some time to plan things out with his job.       I  spent 40 minutes counseling the patient face to face.   Scott Mcguire 04/15/2023 1:06 PM

## 2023-04-16 ENCOUNTER — Encounter: Payer: Self-pay | Admitting: *Deleted

## 2023-04-16 ENCOUNTER — Ambulatory Visit: Payer: BC Managed Care – PPO | Admitting: Cardiology

## 2023-04-17 ENCOUNTER — Ambulatory Visit: Payer: BC Managed Care – PPO | Attending: Cardiology | Admitting: Cardiology

## 2023-04-17 ENCOUNTER — Encounter: Payer: Self-pay | Admitting: Cardiology

## 2023-04-17 VITALS — BP 116/78 | HR 91 | Ht 70.0 in | Wt 210.2 lb

## 2023-04-17 DIAGNOSIS — I2510 Atherosclerotic heart disease of native coronary artery without angina pectoris: Secondary | ICD-10-CM

## 2023-04-17 DIAGNOSIS — E7841 Elevated Lipoprotein(a): Secondary | ICD-10-CM

## 2023-04-17 DIAGNOSIS — E782 Mixed hyperlipidemia: Secondary | ICD-10-CM | POA: Diagnosis not present

## 2023-04-17 DIAGNOSIS — I10 Essential (primary) hypertension: Secondary | ICD-10-CM | POA: Diagnosis not present

## 2023-04-17 DIAGNOSIS — I4719 Other supraventricular tachycardia: Secondary | ICD-10-CM

## 2023-04-17 MED ORDER — ISOSORBIDE MONONITRATE ER 30 MG PO TB24: 30.0000 mg | ORAL_TABLET | Freq: Every day | ORAL | 3 refills | Status: DC

## 2023-04-17 NOTE — Patient Instructions (Signed)
Medication Instructions:  Your physician has recommended you make the following change in your medication:  Start Imdur 30 mg once daily  *If you need a refill on your cardiac medications before your next appointment, please call your pharmacy*   Lab Work: NONE If you have labs (blood work) drawn today and your tests are completely normal, you will receive your results only by: MyChart Message (if you have MyChart) OR A paper copy in the mail If you have any lab test that is abnormal or we need to change your treatment, we will call you to review the results.   Testing/Procedures: NONE   Follow-Up: At [facility], you and your health needs are our priority.  As part of our continuing mission to provide you with exceptional heart care, we have created designated Provider Care Teams.  These Care Teams include your primary Cardiologist (physician) and Advanced Practice Providers (APPs -  Physician Assistants and Nurse Practitioners) who all work together to provide you with the care you need, when you need it.  We recommend signing up for the patient portal called "MyChart".  Sign up information is provided on this After Visit Summary.  MyChart is used to connect with patients for Virtual Visits (Telemedicine).  Patients are able to view lab/test results, encounter notes, upcoming appointments, etc.  Non-urgent messages can be sent to your provider as well.   To learn more about what you can do with MyChart, go to [email or web address].    Your next appointment:   7 week(s)  Provider:   Wallis Bamberg, NP Rosalita Levan)    Other Instructions

## 2023-04-27 ENCOUNTER — Other Ambulatory Visit (HOSPITAL_COMMUNITY): Payer: BC Managed Care – PPO

## 2023-04-27 ENCOUNTER — Ambulatory Visit (HOSPITAL_COMMUNITY): Payer: BC Managed Care – PPO

## 2023-05-11 NOTE — Progress Notes (Signed)
Surgical Instructions    Your procedure is scheduled on August 14,2024.  Report to [facility] Main Entrance "A" at 6:30 A.M., then check in with the Admitting office.  Call this number if you have problems the morning of surgery:  [phone]  If you have any questions prior to your surgery date call [phone]: Open Monday-Friday 8am-4pm If you experience any cold or flu symptoms such as cough, fever, chills, shortness of breath, etc. between now and your scheduled surgery, please notify us at the above number.     Remember:  Do not eat or drink after midnight the night before your surgery    Take these medicines the morning of surgery with A SIP OF WATER  aspirin EC   atorvastatin (LIPITOR)  diltiazem (CARDIZEM CD)  isosorbide mononitrate (IMDUR) Patient is not taking at this time  omeprazole (PRILOSEC)   propranolol (INDERAL)  IF NEEDED:  acetaminophen (TYLENOL)   nitroGLYCERIN (NITROSTAT) PLEASE CALL [phone] IF YOU NEED TO TAKE THIS MEDICATION DAY BEFORE OR MORNING OF  As of today, STOP taking any Aspirin (unless otherwise instructed by your surgeon) Aleve, Naproxen, Ibuprofen, Motrin, Advil, Goody's, BC's, all herbal medications, fish oil, and all vitamins.                     Do NOT Smoke (Tobacco/Vaping) for 24 hours prior to your procedure.  If you use a CPAP at night, you may bring your mask/headgear for your overnight stay.   Contacts, glasses, piercing's, hearing aid's, dentures or partials may not be worn into surgery, please bring cases for these belongings.    For patients admitted to the hospital, discharge time will be determined by your treatment team.   Patients discharged the day of surgery will not be allowed to drive home, and someone needs to stay with them for 24 hours.  SURGICAL WAITING ROOM VISITATION Patients having surgery or a procedure may have no more than 2 support people in the waiting area - these visitors may rotate.   Children under  the age of 73 must have an adult with them who is not the patient. If the patient needs to stay at the hospital during part of their recovery, the visitor guidelines for inpatient rooms apply. Pre-op nurse will coordinate an appropriate time for 1 support person to accompany patient in pre-op.  This support person may not rotate.   Please refer to the [facility] website for the visitor guidelines for Inpatients (after your surgery is over and you are in a regular room).    Special instructions:   [facility]- Preparing For Surgery  Before surgery, you can play an important role. Because skin is not sterile, your skin needs to be as free of germs as possible. You can reduce the number of germs on your skin by washing with CHG (chlorahexidine gluconate) Soap before surgery.  CHG is an antiseptic cleaner which kills germs and bonds with the skin to continue killing germs even after washing.    Oral Hygiene is also important to reduce your risk of infection.  Remember - BRUSH YOUR TEETH THE MORNING OF SURGERY WITH YOUR REGULAR TOOTHPASTE  Please do not use if you have an allergy to CHG or antibacterial soaps. If your skin becomes reddened/irritated stop using the CHG.  Do not shave (including legs and underarms) for at least 48 hours prior to first CHG shower. It is OK to shave your face.  Please follow these instructions carefully.   Shower  the NIGHT BEFORE SURGERY and the MORNING OF SURGERY  If you chose to wash your hair, wash your hair first as usual with your normal shampoo.  After you shampoo, rinse your hair and body thoroughly to remove the shampoo.  Use CHG Soap as you would any other liquid soap. You can apply CHG directly to the skin and wash gently with a scrungie or a clean washcloth.   Apply the CHG Soap to your body ONLY FROM THE NECK DOWN.  Do not use on open wounds or open sores. Avoid contact with your eyes, ears, mouth and genitals (private parts). Wash Face and genitals  (private parts)  with your normal soap.   Wash thoroughly, paying special attention to the area where your surgery will be performed.  Thoroughly rinse your body with warm water from the neck down.  DO NOT shower/wash with your normal soap after using and rinsing off the CHG Soap.  Pat yourself dry with a CLEAN TOWEL.  Wear CLEAN PAJAMAS to bed the night before surgery  Place CLEAN SHEETS on your bed the night before your surgery  DO NOT SLEEP WITH PETS.   Day of Surgery: Take a shower with CHG soap. Do not wear jewelry or makeup Do not wear lotions, powders, perfumes/colognes, or deodorant. Do not shave 48 hours prior to surgery.  Men may shave face and neck. Do not bring valuables to the hospital.  [facility] is not responsible for any belongings or valuables. Do not wear nail polish, gel polish, artificial nails, or any other type of covering on natural nails (fingers and toes) If you have artificial nails or gel coating that need to be removed by a nail salon, please have this removed prior to surgery. Artificial nails or gel coating may interfere with anesthesia's ability to adequately monitor your vital signs. Wear Clean/Comfortable clothing the morning of surgery Remember to brush your teeth WITH YOUR REGULAR TOOTHPASTE.   Please read over the following fact sheets that you were given.    If you received a COVID test during your pre-op visit  it is requested that you wear a mask when out in public, stay away from anyone that may not be feeling well and notify your surgeon if you develop symptoms. If you have been in contact with anyone that has tested positive in the last 10 days please notify you surgeon.

## 2023-05-14 ENCOUNTER — Ambulatory Visit (HOSPITAL_COMMUNITY)
Admission: RE | Admit: 2023-05-14 | Discharge: 2023-05-14 | Disposition: A | Discharge status: Home / Self Care | Payer: BC Managed Care – PPO | Source: Ambulatory Visit | Attending: Thoracic Surgery (Cardiothoracic Vascular Surgery) | Admitting: Thoracic Surgery (Cardiothoracic Vascular Surgery)

## 2023-05-14 ENCOUNTER — Encounter (HOSPITAL_COMMUNITY): Payer: Self-pay

## 2023-05-14 ENCOUNTER — Inpatient Hospital Stay (HOSPITAL_COMMUNITY): Admission: RE | Admit: 2023-05-14 | Payer: BC Managed Care – PPO | Source: Ambulatory Visit

## 2023-05-14 ENCOUNTER — Other Ambulatory Visit: Payer: Self-pay

## 2023-05-14 VITALS — BP 133/94 | HR 76 | Temp 97.6°F | Resp 17 | Ht 70.0 in | Wt 198.7 lb

## 2023-05-14 DIAGNOSIS — K5900 Constipation, unspecified: Secondary | ICD-10-CM | POA: Diagnosis not present

## 2023-05-14 DIAGNOSIS — I25119 Atherosclerotic heart disease of native coronary artery with unspecified angina pectoris: Secondary | ICD-10-CM | POA: Diagnosis not present

## 2023-05-14 DIAGNOSIS — Z452 Encounter for adjustment and management of vascular access device: Secondary | ICD-10-CM | POA: Diagnosis not present

## 2023-05-14 DIAGNOSIS — R079 Chest pain, unspecified: Secondary | ICD-10-CM

## 2023-05-14 DIAGNOSIS — I2510 Atherosclerotic heart disease of native coronary artery without angina pectoris: Secondary | ICD-10-CM

## 2023-05-14 DIAGNOSIS — Z7982 Long term (current) use of aspirin: Secondary | ICD-10-CM | POA: Diagnosis not present

## 2023-05-14 DIAGNOSIS — I10 Essential (primary) hypertension: Secondary | ICD-10-CM | POA: Insufficient documentation

## 2023-05-14 DIAGNOSIS — E782 Mixed hyperlipidemia: Secondary | ICD-10-CM | POA: Diagnosis not present

## 2023-05-14 DIAGNOSIS — Z01818 Encounter for other preprocedural examination: Secondary | ICD-10-CM | POA: Diagnosis not present

## 2023-05-14 DIAGNOSIS — R0989 Other specified symptoms and signs involving the circulatory and respiratory systems: Secondary | ICD-10-CM | POA: Diagnosis not present

## 2023-05-14 DIAGNOSIS — E785 Hyperlipidemia, unspecified: Secondary | ICD-10-CM | POA: Insufficient documentation

## 2023-05-14 DIAGNOSIS — Z01812 Encounter for preprocedural laboratory examination: Secondary | ICD-10-CM | POA: Insufficient documentation

## 2023-05-14 DIAGNOSIS — R799 Abnormal finding of blood chemistry, unspecified: Secondary | ICD-10-CM | POA: Insufficient documentation

## 2023-05-14 DIAGNOSIS — E7800 Pure hypercholesterolemia, unspecified: Secondary | ICD-10-CM | POA: Diagnosis not present

## 2023-05-14 DIAGNOSIS — E669 Obesity, unspecified: Secondary | ICD-10-CM | POA: Diagnosis not present

## 2023-05-14 DIAGNOSIS — Z01810 Encounter for preprocedural cardiovascular examination: Secondary | ICD-10-CM | POA: Insufficient documentation

## 2023-05-14 DIAGNOSIS — J939 Pneumothorax, unspecified: Secondary | ICD-10-CM | POA: Diagnosis not present

## 2023-05-14 DIAGNOSIS — Z48812 Encounter for surgical aftercare following surgery on the circulatory system: Secondary | ICD-10-CM | POA: Diagnosis not present

## 2023-05-14 DIAGNOSIS — J9811 Atelectasis: Secondary | ICD-10-CM | POA: Diagnosis not present

## 2023-05-14 DIAGNOSIS — N411 Chronic prostatitis: Secondary | ICD-10-CM | POA: Diagnosis not present

## 2023-05-14 DIAGNOSIS — H548 Legal blindness, as defined in USA: Secondary | ICD-10-CM | POA: Diagnosis not present

## 2023-05-14 DIAGNOSIS — Z79899 Other long term (current) drug therapy: Secondary | ICD-10-CM | POA: Diagnosis not present

## 2023-05-14 DIAGNOSIS — Z8249 Family history of ischemic heart disease and other diseases of the circulatory system: Secondary | ICD-10-CM | POA: Diagnosis not present

## 2023-05-14 DIAGNOSIS — Z6830 Body mass index (BMI) 30.0-30.9, adult: Secondary | ICD-10-CM | POA: Diagnosis not present

## 2023-05-14 DIAGNOSIS — Z951 Presence of aortocoronary bypass graft: Secondary | ICD-10-CM | POA: Diagnosis not present

## 2023-05-14 DIAGNOSIS — F419 Anxiety disorder, unspecified: Secondary | ICD-10-CM | POA: Diagnosis not present

## 2023-05-14 DIAGNOSIS — R918 Other nonspecific abnormal finding of lung field: Secondary | ICD-10-CM | POA: Diagnosis not present

## 2023-05-14 DIAGNOSIS — Z1152 Encounter for screening for COVID-19: Secondary | ICD-10-CM | POA: Diagnosis not present

## 2023-05-14 DIAGNOSIS — Z4682 Encounter for fitting and adjustment of non-vascular catheter: Secondary | ICD-10-CM | POA: Diagnosis not present

## 2023-05-14 HISTORY — DX: Dyspnea, unspecified: R06.00

## 2023-05-14 HISTORY — DX: Angina pectoris, unspecified: I20.9

## 2023-05-14 LAB — COMPREHENSIVE METABOLIC PANEL
ALT: 19 U/L (ref 0–44)
AST: 22 U/L (ref 15–41)
Albumin: 4.2 g/dL (ref 3.5–5.0)
Alkaline Phosphatase: 98 U/L (ref 38–126)
Anion gap: 11 (ref 5–15)
BUN: 15 mg/dL (ref 6–20)
CO2: 23 mmol/L (ref 22–32)
Calcium: 9.3 mg/dL (ref 8.9–10.3)
Chloride: 99 mmol/L (ref 98–111)
Creatinine, Ser: 1.18 mg/dL (ref 0.61–1.24)
GFR, Estimated: >60 mL/min (ref >60)
Glucose, Bld: 94 mg/dL (ref 70–99)
Potassium: 3.2 mmol/L — ABNORMAL LOW (ref 3.5–5.1)
Sodium: 133 mmol/L — ABNORMAL LOW (ref 135–145)
Total Bilirubin: 0.8 mg/dL (ref 0.3–1.2)
Total Protein: 7.2 g/dL (ref 6.5–8.1)

## 2023-05-14 LAB — CBC
HCT: 44.6 % (ref 39.0–52.0)
Hemoglobin: 15.7 g/dL (ref 13.0–17.0)
MCH: 30.8 pg (ref 26.0–34.0)
MCHC: 35.2 g/dL (ref 30.0–36.0)
MCV: 87.5 fL (ref 80.0–100.0)
Platelets: 299 10*3/uL (ref 150–400)
RBC: 5.1 MIL/uL (ref 4.22–5.81)
RDW: 12.1 % (ref 11.5–15.5)
WBC: 6.7 10*3/uL (ref 4.0–10.5)
nRBC: 0 % (ref 0.0–0.2)

## 2023-05-14 LAB — URINALYSIS, ROUTINE W REFLEX MICROSCOPIC
Bilirubin Urine: NEGATIVE
Glucose, UA: NEGATIVE mg/dL
Hgb urine dipstick: NEGATIVE
Ketones, ur: NEGATIVE mg/dL
Leukocytes,Ua: NEGATIVE
Nitrite: NEGATIVE
Protein, ur: NEGATIVE mg/dL
Specific Gravity, Urine: 1.013 (ref 1.005–1.030)
pH: 6 (ref 5.0–8.0)

## 2023-05-14 LAB — PROTIME-INR
INR: 1 (ref 0.8–1.2)
Prothrombin Time: 13.7 seconds (ref 11.4–15.2)

## 2023-05-14 LAB — TYPE AND SCREEN
ABO/RH(D): O POS
Antibody Screen: NEGATIVE

## 2023-05-14 LAB — VAS US DOPPLER PRE CABG

## 2023-05-14 LAB — SARS CORONAVIRUS 2 (TAT 6-24 HRS): SARS Coronavirus 2: NEGATIVE

## 2023-05-14 LAB — APTT: aPTT: 30 seconds (ref 24–36)

## 2023-05-14 LAB — SURGICAL PCR SCREEN
MRSA, PCR: NEGATIVE
Staphylococcus aureus: NEGATIVE

## 2023-05-14 NOTE — Progress Notes (Signed)
PCP -  Donnel Saxon Cardiologist - Thomasene Ripple  PPM/ICD - Denies Device Orders - N/a Rep Notified - na/  Chest x-ray - 05-14-23 EKG - 05-14-23 Stress Test - 06-15-20 ECHO - 04-04-23 Cardiac Cath - 04-24-23  Sleep Study - yes retested for 2nd was found not to have per patient CPAP - n/a  DM- Denies  Blood Thinner Instructions: n/a Aspirin Instructions: patient instructed to cardiologist to confirm stop date. Patient reports last dose 05-11-23  ERAS Protcol -NPO   COVID TEST- 05-14-23   Anesthesia review: yes   Patient denies shortness of breath, fever, cough and chest pain at PAT appointment   All instructions explained to the patient, with a verbal understanding of the material. Patient agrees to go over the instructions while at home for a better understanding. Patient also instructed to self quarantine after being tested for COVID-19. The opportunity to ask questions was provided.

## 2023-05-14 NOTE — Progress Notes (Signed)
VASCULAR LAB    Pre CABG Dopplers have been performed.  See CV proc for preliminary results.   Janit Cutter, RVT 05/14/2023, 11:18 AM

## 2023-05-15 ENCOUNTER — Encounter (HOSPITAL_COMMUNITY): Payer: Self-pay | Admitting: Thoracic Surgery (Cardiothoracic Vascular Surgery)

## 2023-05-15 MED ORDER — EPINEPHRINE HCL 5 MG/250ML IV SOLN IN NS
0.0000 ug/min | INTRAVENOUS | Status: DC
  Filled 2023-05-15: qty 250

## 2023-05-15 MED ORDER — TRANEXAMIC ACID (OHS) PUMP PRIME SOLUTION
2.0000 mg/kg | INTRAVENOUS | Status: DC
  Filled 2023-05-15: qty 1.8

## 2023-05-15 MED ORDER — MILRINONE LACTATE IN DEXTROSE 20-5 MG/100ML-% IV SOLN
0.3000 ug/kg/min | INTRAVENOUS | Status: DC
  Filled 2023-05-15: qty 100

## 2023-05-15 MED ORDER — HEPARIN 30,000 UNITS/1000 ML (OHS) CELLSAVER SOLUTION
Status: DC
  Filled 2023-05-15: qty 1000

## 2023-05-15 MED ORDER — MANNITOL 20 % IV SOLN
INTRAVENOUS | Status: DC
  Filled 2023-05-15: qty 13

## 2023-05-15 MED ORDER — TRANEXAMIC ACID (OHS) BOLUS VIA INFUSION
15.0000 mg/kg | INTRAVENOUS | Status: AC
  Administered 2023-05-16: 1351.5 mg via INTRAVENOUS
  Filled 2023-05-15: qty 1352

## 2023-05-15 MED ORDER — TRANEXAMIC ACID 1000 MG/10ML IV SOLN
1.5000 mg/kg/h | INTRAVENOUS | Status: AC
  Administered 2023-05-16: 1.5 mg/kg/h via INTRAVENOUS
  Filled 2023-05-15: qty 25

## 2023-05-15 MED ORDER — VANCOMYCIN HCL 1250 MG/250ML IV SOLN
1250.0000 mg | INTRAVENOUS | Status: DC
  Filled 2023-05-15: qty 250

## 2023-05-15 MED ORDER — NITROGLYCERIN IN D5W 200-5 MCG/ML-% IV SOLN
2.0000 ug/min | INTRAVENOUS | Status: DC
  Filled 2023-05-15: qty 250

## 2023-05-15 MED ORDER — INSULIN REGULAR(HUMAN) IN NACL 100-0.9 UT/100ML-% IV SOLN
INTRAVENOUS | Status: AC
  Administered 2023-05-16: .7 [IU]/h via INTRAVENOUS
  Filled 2023-05-15: qty 100

## 2023-05-15 MED ORDER — NOREPINEPHRINE 4 MG/250ML-% IV SOLN
0.0000 ug/min | INTRAVENOUS | Status: DC
  Filled 2023-05-15: qty 250

## 2023-05-15 MED ORDER — LEVOFLOXACIN IN D5W 500 MG/100ML IV SOLN
500.0000 mg | INTRAVENOUS | Status: AC
  Administered 2023-05-16: 500 mg via INTRAVENOUS
  Filled 2023-05-15: qty 100

## 2023-05-15 MED ORDER — PHENYLEPHRINE HCL-NACL 20-0.9 MG/250ML-% IV SOLN
30.0000 ug/min | INTRAVENOUS | Status: AC
  Administered 2023-05-16: 25 ug/min via INTRAVENOUS
  Filled 2023-05-15: qty 250

## 2023-05-15 MED ORDER — VANCOMYCIN HCL 1500 MG/300ML IV SOLN
1500.0000 mg | INTRAVENOUS | Status: AC
  Administered 2023-05-16: 1500 mg via INTRAVENOUS
  Filled 2023-05-15 (×3): qty 300

## 2023-05-15 MED ORDER — PLASMA-LYTE A IV SOLN
INTRAVENOUS | Status: DC
  Filled 2023-05-15: qty 2.5

## 2023-05-15 MED ORDER — DEXMEDETOMIDINE HCL IN NACL 400 MCG/100ML IV SOLN
0.1000 ug/kg/h | INTRAVENOUS | Status: AC
  Administered 2023-05-16: .3 ug/kg/h via INTRAVENOUS
  Filled 2023-05-15: qty 100

## 2023-05-15 MED ORDER — POTASSIUM CHLORIDE 2 MEQ/ML IV SOLN
80.0000 meq | INTRAVENOUS | Status: DC
  Filled 2023-05-15: qty 40

## 2023-05-15 NOTE — Anesthesia Preprocedure Evaluation (Signed)
Anesthesia Evaluation  Patient identified by MRN, date of birth, ID band Patient awake    Reviewed: Allergy & Precautions, NPO status , Patient's Chart, lab work & pertinent test results  History of Anesthesia Complications Negative for: history of anesthetic complications  Airway Mallampati: II  TM Distance: >3 FB Neck ROM: Full    Dental  (+) Dental Advisory Given   Pulmonary neg pulmonary ROS   Pulmonary exam normal        Cardiovascular hypertension, Pt. on medications + CAD  Normal cardiovascular exam+ dysrhythmias    '24 TTE - EF 60 to 65%. There is mild asymmetric left ventricular hypertrophy of the basal-septal segment. Trivial MR.  '24 Cath -   Prox LAD to Mid LAD lesion is 70% stenosed.   Ost LAD to Prox LAD lesion is 50% stenosed.   Mid LAD lesion is 90% stenosed.   Ramus-1 lesion is 80% stenosed.   Ramus-2 lesion is 50% stenosed.   Ost Cx lesion is 40% stenosed.   Mid Cx lesion is 50% stenosed.     Neuro/Psych  Headaches PSYCHIATRIC DISORDERS Anxiety      Legally blind     GI/Hepatic Neg liver ROS,GERD  Medicated and Controlled,,  Endo/Other  negative endocrine ROS    Renal/GU negative Renal ROS     Musculoskeletal negative musculoskeletal ROS (+)    Abdominal   Peds  Hematology negative hematology ROS (+)   Anesthesia Other Findings   Reproductive/Obstetrics                             Anesthesia Physical Anesthesia Plan  ASA: 4  Anesthesia Plan: General   Post-op Pain Management: Minimal or no pain anticipated   Induction: Intravenous  PONV Risk Score and Plan: 2 and Treatment may vary due to age or medical condition  Airway Management Planned: Oral ETT  Additional Equipment: Arterial line, CVP, TEE and Ultrasound Guidance Line Placement  Intra-op Plan:   Post-operative Plan: Post-operative intubation/ventilation  Informed Consent: I have  reviewed the patients History and Physical, chart, labs and discussed the procedure including the risks, benefits and alternatives for the proposed anesthesia with the patient or authorized representative who has indicated his/her understanding and acceptance.     Dental advisory given  Plan Discussed with: CRNA and Anesthesiologist  Anesthesia Plan Comments:         Anesthesia Quick Evaluation

## 2023-05-16 ENCOUNTER — Encounter (HOSPITAL_COMMUNITY): Payer: Self-pay | Admitting: Thoracic Surgery (Cardiothoracic Vascular Surgery)

## 2023-05-16 ENCOUNTER — Inpatient Hospital Stay (HOSPITAL_COMMUNITY): Payer: BC Managed Care – PPO | Admitting: Certified Registered Nurse Anesthetist

## 2023-05-16 ENCOUNTER — Inpatient Hospital Stay (HOSPITAL_COMMUNITY): Payer: BC Managed Care – PPO | Admitting: Vascular Surgery

## 2023-05-16 ENCOUNTER — Inpatient Hospital Stay (HOSPITAL_COMMUNITY)
Admission: RE | Admit: 2023-05-16 | Discharge: 2023-05-21 | DRG: 236 | Disposition: A | Discharge status: Home / Self Care | Payer: BC Managed Care – PPO | Attending: Thoracic Surgery (Cardiothoracic Vascular Surgery) | Admitting: Thoracic Surgery (Cardiothoracic Vascular Surgery)

## 2023-05-16 ENCOUNTER — Inpatient Hospital Stay (HOSPITAL_COMMUNITY): Payer: BC Managed Care – PPO

## 2023-05-16 ENCOUNTER — Other Ambulatory Visit: Payer: Self-pay

## 2023-05-16 ENCOUNTER — Inpatient Hospital Stay (HOSPITAL_COMMUNITY)
Admission: RE | Disposition: A | Discharge status: Home / Self Care | Payer: Self-pay | Source: Home / Self Care | Attending: Thoracic Surgery (Cardiothoracic Vascular Surgery)

## 2023-05-16 DIAGNOSIS — Z1152 Encounter for screening for COVID-19: Secondary | ICD-10-CM | POA: Diagnosis not present

## 2023-05-16 DIAGNOSIS — I10 Essential (primary) hypertension: Secondary | ICD-10-CM | POA: Diagnosis present

## 2023-05-16 DIAGNOSIS — I2510 Atherosclerotic heart disease of native coronary artery without angina pectoris: Secondary | ICD-10-CM | POA: Diagnosis not present

## 2023-05-16 DIAGNOSIS — E7800 Pure hypercholesterolemia, unspecified: Secondary | ICD-10-CM | POA: Diagnosis present

## 2023-05-16 DIAGNOSIS — N411 Chronic prostatitis: Secondary | ICD-10-CM | POA: Diagnosis present

## 2023-05-16 DIAGNOSIS — Z951 Presence of aortocoronary bypass graft: Principal | ICD-10-CM

## 2023-05-16 DIAGNOSIS — Z8249 Family history of ischemic heart disease and other diseases of the circulatory system: Secondary | ICD-10-CM | POA: Diagnosis not present

## 2023-05-16 DIAGNOSIS — E669 Obesity, unspecified: Secondary | ICD-10-CM | POA: Diagnosis present

## 2023-05-16 DIAGNOSIS — Z7982 Long term (current) use of aspirin: Secondary | ICD-10-CM | POA: Diagnosis not present

## 2023-05-16 DIAGNOSIS — F419 Anxiety disorder, unspecified: Secondary | ICD-10-CM | POA: Diagnosis present

## 2023-05-16 DIAGNOSIS — I25119 Atherosclerotic heart disease of native coronary artery with unspecified angina pectoris: Principal | ICD-10-CM | POA: Diagnosis present

## 2023-05-16 DIAGNOSIS — Z79899 Other long term (current) drug therapy: Secondary | ICD-10-CM | POA: Diagnosis not present

## 2023-05-16 DIAGNOSIS — Z6830 Body mass index (BMI) 30.0-30.9, adult: Secondary | ICD-10-CM | POA: Diagnosis not present

## 2023-05-16 DIAGNOSIS — R079 Chest pain, unspecified: Secondary | ICD-10-CM

## 2023-05-16 DIAGNOSIS — K5900 Constipation, unspecified: Secondary | ICD-10-CM | POA: Diagnosis not present

## 2023-05-16 DIAGNOSIS — H548 Legal blindness, as defined in USA: Secondary | ICD-10-CM | POA: Diagnosis present

## 2023-05-16 HISTORY — PX: CORONARY ARTERY BYPASS GRAFT: SHX141

## 2023-05-16 HISTORY — PX: TEE WITHOUT CARDIOVERSION: SHX5443

## 2023-05-16 HISTORY — DX: Presence of aortocoronary bypass graft: Z95.1

## 2023-05-16 LAB — POCT I-STAT 7, (LYTES, BLD GAS, ICA,H+H)
Acid-Base Excess: 1 mmol/L (ref 0.0–2.0)
Acid-Base Excess: 1 mmol/L (ref 0.0–2.0)
Acid-base deficit: 2 mmol/L (ref 0.0–2.0)
Acid-base deficit: 6 mmol/L — ABNORMAL HIGH (ref 0.0–2.0)
Acid-base deficit: 6 mmol/L — ABNORMAL HIGH (ref 0.0–2.0)
Bicarbonate: 24.8 mmol/L (ref 20.0–28.0)
Bicarbonate: 25.2 mmol/L (ref 20.0–28.0)
Bicarbonate: 22.6 mmol/L (ref 20.0–28.0)
Bicarbonate: 20.5 mmol/L (ref 20.0–28.0)
Bicarbonate: 20.7 mmol/L (ref 20.0–28.0)
Calcium, Ion: 1.03 mmol/L — ABNORMAL LOW (ref 1.15–1.40)
Calcium, Ion: 1.07 mmol/L — ABNORMAL LOW (ref 1.15–1.40)
Calcium, Ion: 1.2 mmol/L (ref 1.15–1.40)
Calcium, Ion: 1.13 mmol/L — ABNORMAL LOW (ref 1.15–1.40)
Calcium, Ion: 1.16 mmol/L (ref 1.15–1.40)
HCT: 26 % — ABNORMAL LOW (ref 39.0–52.0)
HCT: 27 % — ABNORMAL LOW (ref 39.0–52.0)
HCT: 28 % — ABNORMAL LOW (ref 39.0–52.0)
HCT: 29 % — ABNORMAL LOW (ref 39.0–52.0)
HCT: 29 % — ABNORMAL LOW (ref 39.0–52.0)
Hemoglobin: 8.8 g/dL — ABNORMAL LOW (ref 13.0–17.0)
Hemoglobin: 9.2 g/dL — ABNORMAL LOW (ref 13.0–17.0)
Hemoglobin: 9.5 g/dL — ABNORMAL LOW (ref 13.0–17.0)
Hemoglobin: 9.9 g/dL — ABNORMAL LOW (ref 13.0–17.0)
Hemoglobin: 9.9 g/dL — ABNORMAL LOW (ref 13.0–17.0)
O2 Saturation: 100 %
O2 Saturation: 87 %
O2 Saturation: 98 %
O2 Saturation: 99 %
O2 Saturation: 98 %
Patient temperature: 36.2
Patient temperature: 38.2
Patient temperature: 99.6
Potassium: 4 mmol/L (ref 3.5–5.1)
Potassium: 4.6 mmol/L (ref 3.5–5.1)
Potassium: 3.5 mmol/L (ref 3.5–5.1)
Potassium: 4.1 mmol/L (ref 3.5–5.1)
Potassium: 4.1 mmol/L (ref 3.5–5.1)
Sodium: 137 mmol/L (ref 135–145)
Sodium: 138 mmol/L (ref 135–145)
Sodium: 140 mmol/L (ref 135–145)
Sodium: 142 mmol/L (ref 135–145)
Sodium: 141 mmol/L (ref 135–145)
TCO2: 26 mmol/L (ref 22–32)
TCO2: 26 mmol/L (ref 22–32)
TCO2: 24 mmol/L (ref 22–32)
TCO2: 22 mmol/L (ref 22–32)
TCO2: 22 mmol/L (ref 22–32)
pCO2 arterial: 36.3 mmHg (ref 32–48)
pCO2 arterial: 37.9 mmHg (ref 32–48)
pCO2 arterial: 37.4 mmHg (ref 32–48)
pCO2 arterial: 44.5 mmHg (ref 32–48)
pCO2 arterial: 45.8 mmHg (ref 32–48)
pH, Arterial: 7.432 (ref 7.35–7.45)
pH, Arterial: 7.442 (ref 7.35–7.45)
pH, Arterial: 7.385 (ref 7.35–7.45)
pH, Arterial: 7.278 — ABNORMAL LOW (ref 7.35–7.45)
pH, Arterial: 7.266 — ABNORMAL LOW (ref 7.35–7.45)
pO2, Arterial: 345 mmHg — ABNORMAL HIGH (ref 83–108)
pO2, Arterial: 51 mmHg — ABNORMAL LOW (ref 83–108)
pO2, Arterial: 105 mmHg (ref 83–108)
pO2, Arterial: 144 mmHg — ABNORMAL HIGH (ref 83–108)
pO2, Arterial: 121 mmHg — ABNORMAL HIGH (ref 83–108)

## 2023-05-16 LAB — CBC
HCT: 32.2 % — ABNORMAL LOW (ref 39.0–52.0)
HCT: 32.9 % — ABNORMAL LOW (ref 39.0–52.0)
Hemoglobin: 11.4 g/dL — ABNORMAL LOW (ref 13.0–17.0)
Hemoglobin: 11.2 g/dL — ABNORMAL LOW (ref 13.0–17.0)
MCH: 31.2 pg (ref 26.0–34.0)
MCH: 29.9 pg (ref 26.0–34.0)
MCHC: 35.4 g/dL (ref 30.0–36.0)
MCHC: 34 g/dL (ref 30.0–36.0)
MCV: 88.2 fL (ref 80.0–100.0)
MCV: 88 fL (ref 80.0–100.0)
Platelets: 165 10*3/uL (ref 150–400)
Platelets: 168 10*3/uL (ref 150–400)
RBC: 3.65 MIL/uL — ABNORMAL LOW (ref 4.22–5.81)
RBC: 3.74 MIL/uL — ABNORMAL LOW (ref 4.22–5.81)
RDW: 12.3 % (ref 11.5–15.5)
RDW: 12.4 % (ref 11.5–15.5)
WBC: 11.3 10*3/uL — ABNORMAL HIGH (ref 4.0–10.5)
WBC: 8.8 10*3/uL (ref 4.0–10.5)
nRBC: 0 % (ref 0.0–0.2)
nRBC: 0 % (ref 0.0–0.2)

## 2023-05-16 LAB — POCT I-STAT, CHEM 8
BUN: 15 mg/dL (ref 6–20)
BUN: 14 mg/dL (ref 6–20)
BUN: 12 mg/dL (ref 6–20)
BUN: 13 mg/dL (ref 6–20)
Calcium, Ion: 1.23 mmol/L (ref 1.15–1.40)
Calcium, Ion: 1.2 mmol/L (ref 1.15–1.40)
Calcium, Ion: 1.08 mmol/L — ABNORMAL LOW (ref 1.15–1.40)
Calcium, Ion: 1.33 mmol/L (ref 1.15–1.40)
Chloride: 102 mmol/L (ref 98–111)
Chloride: 103 mmol/L (ref 98–111)
Chloride: 102 mmol/L (ref 98–111)
Chloride: 102 mmol/L (ref 98–111)
Creatinine, Ser: 1.1 mg/dL (ref 0.61–1.24)
Creatinine, Ser: 0.9 mg/dL (ref 0.61–1.24)
Creatinine, Ser: 0.9 mg/dL (ref 0.61–1.24)
Creatinine, Ser: 0.9 mg/dL (ref 0.61–1.24)
Glucose, Bld: 100 mg/dL — ABNORMAL HIGH (ref 70–99)
Glucose, Bld: 104 mg/dL — ABNORMAL HIGH (ref 70–99)
Glucose, Bld: 108 mg/dL — ABNORMAL HIGH (ref 70–99)
Glucose, Bld: 109 mg/dL — ABNORMAL HIGH (ref 70–99)
HCT: 35 % — ABNORMAL LOW (ref 39.0–52.0)
HCT: 34 % — ABNORMAL LOW (ref 39.0–52.0)
HCT: 26 % — ABNORMAL LOW (ref 39.0–52.0)
HCT: 27 % — ABNORMAL LOW (ref 39.0–52.0)
Hemoglobin: 11.9 g/dL — ABNORMAL LOW (ref 13.0–17.0)
Hemoglobin: 11.6 g/dL — ABNORMAL LOW (ref 13.0–17.0)
Hemoglobin: 8.8 g/dL — ABNORMAL LOW (ref 13.0–17.0)
Hemoglobin: 9.2 g/dL — ABNORMAL LOW (ref 13.0–17.0)
Potassium: 3.3 mmol/L — ABNORMAL LOW (ref 3.5–5.1)
Potassium: 3.2 mmol/L — ABNORMAL LOW (ref 3.5–5.1)
Potassium: 3.6 mmol/L (ref 3.5–5.1)
Potassium: 4 mmol/L (ref 3.5–5.1)
Sodium: 140 mmol/L (ref 135–145)
Sodium: 138 mmol/L (ref 135–145)
Sodium: 138 mmol/L (ref 135–145)
Sodium: 138 mmol/L (ref 135–145)
TCO2: 26 mmol/L (ref 22–32)
TCO2: 25 mmol/L (ref 22–32)
TCO2: 25 mmol/L (ref 22–32)
TCO2: 27 mmol/L (ref 22–32)

## 2023-05-16 LAB — BASIC METABOLIC PANEL
Anion gap: 6 (ref 5–15)
BUN: 11 mg/dL (ref 6–20)
CO2: 22 mmol/L (ref 22–32)
Calcium: 7.5 mg/dL — ABNORMAL LOW (ref 8.9–10.3)
Chloride: 109 mmol/L (ref 98–111)
Creatinine, Ser: 0.91 mg/dL (ref 0.61–1.24)
GFR, Estimated: >60 mL/min (ref >60)
Glucose, Bld: 124 mg/dL — ABNORMAL HIGH (ref 70–99)
Potassium: 4.3 mmol/L (ref 3.5–5.1)
Sodium: 137 mmol/L (ref 135–145)

## 2023-05-16 LAB — BLOOD GAS, ARTERIAL
Acid-Base Excess: 1.3 mmol/L (ref 0.0–2.0)
Bicarbonate: 27.2 mmol/L (ref 20.0–28.0)
Drawn by: 39682
O2 Saturation: 100 %
Patient temperature: 37
pCO2 arterial: 47 mmHg (ref 32–48)
pH, Arterial: 7.37 (ref 7.35–7.45)
pO2, Arterial: 155 mmHg — ABNORMAL HIGH (ref 83–108)

## 2023-05-16 LAB — PROTIME-INR
INR: 1.5 — ABNORMAL HIGH (ref 0.8–1.2)
Prothrombin Time: 18.3 seconds — ABNORMAL HIGH (ref 11.4–15.2)

## 2023-05-16 LAB — GLUCOSE, CAPILLARY
Glucose-Capillary: 127 mg/dL — ABNORMAL HIGH (ref 70–99)
Glucose-Capillary: 99 mg/dL (ref 70–99)
Glucose-Capillary: 112 mg/dL — ABNORMAL HIGH (ref 70–99)
Glucose-Capillary: 116 mg/dL — ABNORMAL HIGH (ref 70–99)
Glucose-Capillary: 110 mg/dL — ABNORMAL HIGH (ref 70–99)
Glucose-Capillary: 118 mg/dL — ABNORMAL HIGH (ref 70–99)
Glucose-Capillary: 84 mg/dL (ref 70–99)

## 2023-05-16 LAB — HEMOGLOBIN AND HEMATOCRIT, BLOOD
HCT: 28.1 % — ABNORMAL LOW (ref 39.0–52.0)
Hemoglobin: 9.8 g/dL — ABNORMAL LOW (ref 13.0–17.0)

## 2023-05-16 LAB — PLATELET COUNT: Platelets: 216 10*3/uL (ref 150–400)

## 2023-05-16 LAB — APTT: aPTT: 33 seconds (ref 24–36)

## 2023-05-16 SURGERY — CORONARY ARTERY BYPASS GRAFTING (CABG)
Anesthesia: General | Site: Chest

## 2023-05-16 MED ORDER — PHENYLEPHRINE 80 MCG/ML (10ML) SYRINGE FOR IV PUSH (FOR BLOOD PRESSURE SUPPORT)
PREFILLED_SYRINGE | INTRAVENOUS | Status: AC
  Filled 2023-05-16: qty 10

## 2023-05-16 MED ORDER — LEVOFLOXACIN IN D5W 750 MG/150ML IV SOLN
750.0000 mg | INTRAVENOUS | Status: AC
  Administered 2023-05-17: 750 mg via INTRAVENOUS
  Filled 2023-05-16: qty 150

## 2023-05-16 MED ORDER — ONDANSETRON HCL 4 MG/2ML IJ SOLN
4.0000 mg | Freq: Four times a day (QID) | INTRAMUSCULAR | Status: DC | PRN
  Administered 2023-05-17: 4 mg via INTRAVENOUS
  Filled 2023-05-16: qty 2

## 2023-05-16 MED ORDER — PHENYLEPHRINE 80 MCG/ML (10ML) SYRINGE FOR IV PUSH (FOR BLOOD PRESSURE SUPPORT)
PREFILLED_SYRINGE | INTRAVENOUS | Status: DC | PRN
  Administered 2023-05-16: 40 ug via INTRAVENOUS

## 2023-05-16 MED ORDER — MIDAZOLAM HCL 2 MG/2ML IJ SOLN
2.0000 mg | INTRAMUSCULAR | Status: DC | PRN
  Administered 2023-05-16 (×2): 2 mg via INTRAVENOUS
  Filled 2023-05-16 (×2): qty 2

## 2023-05-16 MED ORDER — FENTANYL CITRATE (PF) 250 MCG/5ML IJ SOLN
INTRAMUSCULAR | Status: AC
  Filled 2023-05-16: qty 5

## 2023-05-16 MED ORDER — LACTATED RINGERS IV SOLN: INTRAVENOUS | Status: DC

## 2023-05-16 MED ORDER — METOPROLOL TARTRATE 25 MG/10 ML ORAL SUSPENSION
12.5000 mg | Freq: Two times a day (BID) | ORAL | Status: DC
  Administered 2023-05-16: 12.5 mg
  Filled 2023-05-16: qty 10

## 2023-05-16 MED ORDER — INSULIN REGULAR(HUMAN) IN NACL 100-0.9 UT/100ML-% IV SOLN: INTRAVENOUS | Status: DC

## 2023-05-16 MED ORDER — MIDAZOLAM HCL 2 MG/2ML IJ SOLN
INTRAMUSCULAR | Status: AC
  Filled 2023-05-16: qty 2

## 2023-05-16 MED ORDER — CHLORHEXIDINE GLUCONATE 4 % EX SOLN: 30.0000 mL | CUTANEOUS | Status: DC

## 2023-05-16 MED ORDER — SODIUM CHLORIDE 0.45 % IV SOLN: INTRAVENOUS | Status: DC | PRN

## 2023-05-16 MED ORDER — CHLORHEXIDINE GLUCONATE 0.12 % MT SOLN
15.0000 mL | OROMUCOSAL | Status: AC
  Administered 2023-05-16: 15 mL via OROMUCOSAL
  Filled 2023-05-16: qty 15

## 2023-05-16 MED ORDER — ~~LOC~~ CARDIAC SURGERY, PATIENT & FAMILY EDUCATION
Freq: Once | Status: DC
  Filled 2023-05-16: qty 1

## 2023-05-16 MED ORDER — TRAMADOL HCL 50 MG PO TABS
50.0000 mg | ORAL_TABLET | ORAL | Status: DC | PRN
  Administered 2023-05-17 – 2023-05-18 (×6): 100 mg via ORAL
  Filled 2023-05-16 (×6): qty 2

## 2023-05-16 MED ORDER — ORAL CARE MOUTH RINSE
15.0000 mL | OROMUCOSAL | Status: DC
  Administered 2023-05-16 (×3): 15 mL via OROMUCOSAL

## 2023-05-16 MED ORDER — ROCURONIUM BROMIDE 10 MG/ML (PF) SYRINGE
PREFILLED_SYRINGE | INTRAVENOUS | Status: DC | PRN
  Administered 2023-05-16: 20 mg via INTRAVENOUS
  Administered 2023-05-16: 100 mg via INTRAVENOUS
  Administered 2023-05-16 (×2): 50 mg via INTRAVENOUS

## 2023-05-16 MED ORDER — SODIUM CHLORIDE 0.9% FLUSH: 3.0000 mL | INTRAVENOUS | Status: DC | PRN

## 2023-05-16 MED ORDER — PLASMA-LYTE A IV SOLN: INTRAVENOUS | Status: DC | PRN

## 2023-05-16 MED ORDER — SODIUM CHLORIDE 0.9 % IV SOLN: INTRAVENOUS | Status: DC

## 2023-05-16 MED ORDER — PHENYLEPHRINE HCL-NACL 20-0.9 MG/250ML-% IV SOLN: 0.0000 ug/min | INTRAVENOUS | Status: DC

## 2023-05-16 MED ORDER — CHLORHEXIDINE GLUCONATE 0.12 % MT SOLN: 15.0000 mL | Freq: Once | OROMUCOSAL | Status: DC

## 2023-05-16 MED ORDER — ASPIRIN 81 MG PO CHEW: 324.0000 mg | CHEWABLE_TABLET | Freq: Every day | ORAL | Status: DC

## 2023-05-16 MED ORDER — HEMOSTATIC AGENTS (NO CHARGE) OPTIME
TOPICAL | Status: DC | PRN
  Administered 2023-05-16: 1 via TOPICAL

## 2023-05-16 MED ORDER — OXYCODONE HCL 5 MG PO TABS
5.0000 mg | ORAL_TABLET | ORAL | Status: DC | PRN
  Administered 2023-05-16: 5 mg via ORAL
  Administered 2023-05-17 – 2023-05-18 (×6): 10 mg via ORAL
  Administered 2023-05-19 – 2023-05-20 (×2): 5 mg via ORAL
  Administered 2023-05-21: 10 mg via ORAL
  Filled 2023-05-16: qty 2
  Filled 2023-05-16: qty 1
  Filled 2023-05-16 (×2): qty 2
  Filled 2023-05-16: qty 1
  Filled 2023-05-16 (×2): qty 2
  Filled 2023-05-16: qty 1
  Filled 2023-05-16 (×2): qty 2

## 2023-05-16 MED ORDER — NICARDIPINE HCL IN NACL 20-0.86 MG/200ML-% IV SOLN: 0.0000 mg/h | INTRAVENOUS | Status: DC

## 2023-05-16 MED ORDER — PROTAMINE SULFATE 10 MG/ML IV SOLN
INTRAVENOUS | Status: AC
  Filled 2023-05-16: qty 5

## 2023-05-16 MED ORDER — PROPOFOL 10 MG/ML IV BOLUS
INTRAVENOUS | Status: AC
  Filled 2023-05-16: qty 20

## 2023-05-16 MED ORDER — PROPOFOL 10 MG/ML IV BOLUS
INTRAVENOUS | Status: DC | PRN
  Administered 2023-05-16: 100 mg via INTRAVENOUS
  Administered 2023-05-16: 50 mg via INTRAVENOUS

## 2023-05-16 MED ORDER — EZETIMIBE 10 MG PO TABS
10.0000 mg | ORAL_TABLET | Freq: Every day | ORAL | Status: DC
  Administered 2023-05-17 – 2023-05-20 (×4): 10 mg via ORAL
  Filled 2023-05-16 (×4): qty 1

## 2023-05-16 MED ORDER — CHLORHEXIDINE GLUCONATE 0.12 % MT SOLN
15.0000 mL | OROMUCOSAL | Status: AC
  Administered 2023-05-16: 15 mL via OROMUCOSAL
  Filled 2023-05-16 (×2): qty 15

## 2023-05-16 MED ORDER — ALPRAZOLAM 0.5 MG PO TABS
1.0000 mg | ORAL_TABLET | Freq: Every evening | ORAL | Status: DC | PRN
  Administered 2023-05-17: 1 mg via ORAL
  Filled 2023-05-16 (×2): qty 2

## 2023-05-16 MED ORDER — 0.9 % SODIUM CHLORIDE (POUR BTL) OPTIME
TOPICAL | Status: DC | PRN
  Administered 2023-05-16: 5000 mL

## 2023-05-16 MED ORDER — VANCOMYCIN HCL IN DEXTROSE 1-5 GM/200ML-% IV SOLN
1000.0000 mg | Freq: Once | INTRAVENOUS | Status: AC
  Administered 2023-05-16: 1000 mg via INTRAVENOUS
  Filled 2023-05-16: qty 200

## 2023-05-16 MED ORDER — ALBUMIN HUMAN 5 % IV SOLN: INTRAVENOUS | Status: DC | PRN

## 2023-05-16 MED ORDER — PANTOPRAZOLE SODIUM 40 MG IV SOLR
40.0000 mg | Freq: Every day | INTRAVENOUS | Status: AC
  Administered 2023-05-16 – 2023-05-17 (×2): 40 mg via INTRAVENOUS
  Filled 2023-05-16 (×2): qty 10

## 2023-05-16 MED ORDER — ASPIRIN 81 MG PO CHEW
324.0000 mg | CHEWABLE_TABLET | Freq: Once | ORAL | Status: AC
  Administered 2023-05-16: 324 mg via ORAL
  Filled 2023-05-16: qty 4

## 2023-05-16 MED ORDER — SODIUM CHLORIDE 0.9 % IV SOLN: INTRAVENOUS | Status: DC | PRN

## 2023-05-16 MED ORDER — DEXTROSE 50 % IV SOLN: 0.0000 mL | INTRAVENOUS | Status: DC | PRN

## 2023-05-16 MED ORDER — ACETAMINOPHEN 160 MG/5ML PO SOLN: 1000.0000 mg | Freq: Four times a day (QID) | ORAL | Status: DC

## 2023-05-16 MED ORDER — ROCURONIUM BROMIDE 10 MG/ML (PF) SYRINGE
PREFILLED_SYRINGE | INTRAVENOUS | Status: AC
  Filled 2023-05-16: qty 20

## 2023-05-16 MED ORDER — PANTOPRAZOLE SODIUM 40 MG PO TBEC
40.0000 mg | DELAYED_RELEASE_TABLET | Freq: Every day | ORAL | Status: DC
  Administered 2023-05-18 – 2023-05-21 (×4): 40 mg via ORAL
  Filled 2023-05-16 (×4): qty 1

## 2023-05-16 MED ORDER — CHLORHEXIDINE GLUCONATE CLOTH 2 % EX PADS
6.0000 | MEDICATED_PAD | Freq: Every day | CUTANEOUS | Status: DC
  Administered 2023-05-17 – 2023-05-20 (×4): 6 via TOPICAL

## 2023-05-16 MED ORDER — ALBUMIN HUMAN 5 % IV SOLN
250.0000 mL | INTRAVENOUS | Status: AC | PRN
  Administered 2023-05-16 (×5): 12.5 g via INTRAVENOUS
  Filled 2023-05-16 (×3): qty 250

## 2023-05-16 MED ORDER — ACETAMINOPHEN 500 MG PO TABS
1000.0000 mg | ORAL_TABLET | Freq: Four times a day (QID) | ORAL | Status: DC
  Administered 2023-05-17 – 2023-05-21 (×16): 1000 mg via ORAL
  Filled 2023-05-16 (×15): qty 2

## 2023-05-16 MED ORDER — HEPARIN SODIUM (PORCINE) 1000 UNIT/ML IJ SOLN
INTRAMUSCULAR | Status: DC | PRN
  Administered 2023-05-16: 29000 [IU] via INTRAVENOUS

## 2023-05-16 MED ORDER — DEXMEDETOMIDINE HCL IN NACL 200 MCG/50ML IV SOLN
0.0000 ug/kg/h | INTRAVENOUS | Status: DC
  Filled 2023-05-16: qty 50

## 2023-05-16 MED ORDER — BISACODYL 10 MG RE SUPP: 10.0000 mg | Freq: Every day | RECTAL | Status: DC

## 2023-05-16 MED ORDER — POTASSIUM CHLORIDE 10 MEQ/50ML IV SOLN
10.0000 meq | INTRAVENOUS | Status: AC
  Administered 2023-05-16 (×3): 10 meq via INTRAVENOUS

## 2023-05-16 MED ORDER — LACTATED RINGERS IV SOLN: INTRAVENOUS | Status: DC | PRN

## 2023-05-16 MED ORDER — MAGNESIUM SULFATE 4 GM/100ML IV SOLN
4.0000 g | Freq: Once | INTRAVENOUS | Status: AC
  Administered 2023-05-16: 4 g via INTRAVENOUS
  Filled 2023-05-16: qty 100

## 2023-05-16 MED ORDER — MIDAZOLAM HCL (PF) 10 MG/2ML IJ SOLN
INTRAMUSCULAR | Status: AC
  Filled 2023-05-16: qty 2

## 2023-05-16 MED ORDER — MIDAZOLAM HCL (PF) 5 MG/ML IJ SOLN
INTRAMUSCULAR | Status: DC | PRN
  Administered 2023-05-16 (×2): 1 mg via INTRAVENOUS
  Administered 2023-05-16: 2 mg via INTRAVENOUS
  Administered 2023-05-16: 1 mg via INTRAVENOUS
  Administered 2023-05-16: 2 mg via INTRAVENOUS
  Administered 2023-05-16: 1 mg via INTRAVENOUS
  Administered 2023-05-16 (×3): 2 mg via INTRAVENOUS

## 2023-05-16 MED ORDER — BISACODYL 5 MG PO TBEC
10.0000 mg | DELAYED_RELEASE_TABLET | Freq: Every day | ORAL | Status: DC
  Administered 2023-05-17 – 2023-05-20 (×4): 10 mg via ORAL
  Filled 2023-05-16 (×4): qty 2

## 2023-05-16 MED ORDER — METOCLOPRAMIDE HCL 5 MG/ML IJ SOLN
10.0000 mg | Freq: Four times a day (QID) | INTRAMUSCULAR | Status: AC
  Administered 2023-05-16 – 2023-05-17 (×6): 10 mg via INTRAVENOUS
  Filled 2023-05-16 (×6): qty 2

## 2023-05-16 MED ORDER — ROCURONIUM BROMIDE 10 MG/ML (PF) SYRINGE
PREFILLED_SYRINGE | INTRAVENOUS | Status: AC
  Filled 2023-05-16: qty 10

## 2023-05-16 MED ORDER — METOPROLOL TARTRATE 12.5 MG HALF TABLET
12.5000 mg | ORAL_TABLET | Freq: Two times a day (BID) | ORAL | Status: DC
  Administered 2023-05-17 – 2023-05-18 (×4): 12.5 mg via ORAL
  Filled 2023-05-16 (×4): qty 1

## 2023-05-16 MED ORDER — MORPHINE SULFATE (PF) 2 MG/ML IV SOLN
1.0000 mg | INTRAVENOUS | Status: DC | PRN
  Administered 2023-05-16 (×3): 2 mg via INTRAVENOUS
  Administered 2023-05-16: 1 mg via INTRAVENOUS
  Administered 2023-05-17 (×2): 4 mg via INTRAVENOUS
  Administered 2023-05-18: 2 mg via INTRAVENOUS
  Filled 2023-05-16: qty 2
  Filled 2023-05-16 (×5): qty 1
  Filled 2023-05-16: qty 2

## 2023-05-16 MED ORDER — SODIUM CHLORIDE (PF) 0.9 % IJ SOLN: OROMUCOSAL | Status: DC | PRN

## 2023-05-16 MED ORDER — HEPARIN SODIUM (PORCINE) 1000 UNIT/ML IJ SOLN
INTRAMUSCULAR | Status: AC
  Filled 2023-05-16: qty 10

## 2023-05-16 MED ORDER — ASPIRIN 325 MG PO TBEC
325.0000 mg | DELAYED_RELEASE_TABLET | Freq: Every day | ORAL | Status: DC
  Administered 2023-05-17 – 2023-05-21 (×5): 325 mg via ORAL
  Filled 2023-05-16 (×5): qty 1

## 2023-05-16 MED ORDER — DOCUSATE SODIUM 100 MG PO CAPS
200.0000 mg | ORAL_CAPSULE | Freq: Every day | ORAL | Status: DC
  Administered 2023-05-17 – 2023-05-20 (×4): 200 mg via ORAL
  Filled 2023-05-16 (×4): qty 2

## 2023-05-16 MED ORDER — SODIUM CHLORIDE 0.9% FLUSH
3.0000 mL | Freq: Two times a day (BID) | INTRAVENOUS | Status: DC
  Administered 2023-05-17 – 2023-05-20 (×7): 3 mL via INTRAVENOUS

## 2023-05-16 MED ORDER — PROTAMINE SULFATE 10 MG/ML IV SOLN
INTRAVENOUS | Status: AC
  Filled 2023-05-16: qty 25

## 2023-05-16 MED ORDER — METOPROLOL TARTRATE 5 MG/5ML IV SOLN
2.5000 mg | INTRAVENOUS | Status: DC | PRN
  Administered 2023-05-21 (×2): 2.5 mg via INTRAVENOUS
  Filled 2023-05-16: qty 5

## 2023-05-16 MED ORDER — FENTANYL CITRATE (PF) 250 MCG/5ML IJ SOLN
INTRAMUSCULAR | Status: DC | PRN
  Administered 2023-05-16: 50 ug via INTRAVENOUS
  Administered 2023-05-16: 150 ug via INTRAVENOUS
  Administered 2023-05-16: 50 ug via INTRAVENOUS
  Administered 2023-05-16: 150 ug via INTRAVENOUS
  Administered 2023-05-16: 50 ug via INTRAVENOUS
  Administered 2023-05-16 (×7): 100 ug via INTRAVENOUS

## 2023-05-16 MED ORDER — PROTAMINE SULFATE 10 MG/ML IV SOLN
INTRAVENOUS | Status: DC | PRN
  Administered 2023-05-16: 270 mg via INTRAVENOUS

## 2023-05-16 MED ORDER — INSULIN ASPART 100 UNIT/ML IJ SOLN
0.0000 [IU] | INTRAMUSCULAR | Status: DC
  Administered 2023-05-17: 2 [IU] via SUBCUTANEOUS

## 2023-05-16 MED ORDER — ORAL CARE MOUTH RINSE: 15.0000 mL | OROMUCOSAL | Status: DC | PRN

## 2023-05-16 MED ORDER — DEXMEDETOMIDINE HCL IN NACL 400 MCG/100ML IV SOLN
0.0000 ug/kg/h | INTRAVENOUS | Status: DC
  Administered 2023-05-16: 0.7 ug/kg/h via INTRAVENOUS
  Filled 2023-05-16: qty 100

## 2023-05-16 MED ORDER — KETOTIFEN FUMARATE 0.035 % OP SOLN: 1.0000 [drp] | Freq: Two times a day (BID) | OPHTHALMIC | Status: DC | PRN

## 2023-05-16 MED ORDER — SODIUM CHLORIDE 0.9 % IV SOLN: 250.0000 mL | INTRAVENOUS | Status: DC

## 2023-05-16 MED ORDER — ACETAMINOPHEN 160 MG/5ML PO SOLN
650.0000 mg | Freq: Once | ORAL | Status: AC
  Administered 2023-05-16: 650 mg
  Filled 2023-05-16: qty 20.3

## 2023-05-16 MED ORDER — METOPROLOL TARTRATE 12.5 MG HALF TABLET
12.5000 mg | ORAL_TABLET | Freq: Once | ORAL | Status: AC
  Administered 2023-05-16: 12.5 mg via ORAL
  Filled 2023-05-16: qty 1

## 2023-05-16 MED ORDER — ATORVASTATIN CALCIUM 80 MG PO TABS
80.0000 mg | ORAL_TABLET | Freq: Every morning | ORAL | Status: DC
  Administered 2023-05-17 – 2023-05-21 (×5): 80 mg via ORAL
  Filled 2023-05-16 (×5): qty 1

## 2023-05-16 SURGICAL SUPPLY — 99 items
ADH SKN CLS APL DERMABOND .7 (GAUZE/BANDAGES/DRESSINGS) ×2
ANTIFOG SOL W/FOAM PAD STRL (MISCELLANEOUS) ×2
BAG DECANTER FOR FLEXI CONT (MISCELLANEOUS) ×2 IMPLANT
BLADE CLIPPER SURG (BLADE) ×2 IMPLANT
BLADE STERNUM SYSTEM 6 (BLADE) ×2 IMPLANT
BLADE SURG 11 STRL SS (BLADE) IMPLANT
BNDG CMPR 5X4 KNIT ELC UNQ LF (GAUZE/BANDAGES/DRESSINGS) ×2
BNDG CMPR 6 X 5 YARDS HK CLSR (GAUZE/BANDAGES/DRESSINGS) ×2
BNDG ELASTIC 4INX 5YD STR LF (GAUZE/BANDAGES/DRESSINGS) IMPLANT
BNDG ELASTIC 4X5.8 VLCR STR LF (GAUZE/BANDAGES/DRESSINGS) ×2 IMPLANT
BNDG ELASTIC 6INX 5YD STR LF (GAUZE/BANDAGES/DRESSINGS) IMPLANT
BNDG ELASTIC 6X5.8 VLCR STR LF (GAUZE/BANDAGES/DRESSINGS) ×2 IMPLANT
BNDG GAUZE DERMACEA FLUFF 4 (GAUZE/BANDAGES/DRESSINGS) ×2 IMPLANT
BNDG GZE DERMACEA 4 6PLY (GAUZE/BANDAGES/DRESSINGS) ×2
CABLE SURGICAL S-101-97-12 (CABLE) ×2 IMPLANT
CANISTER SUCT 3000ML PPV (MISCELLANEOUS) ×2 IMPLANT
CANNULA MC2 2 STG 29/37 NON-V (CANNULA) ×2 IMPLANT
CANNULA NON VENT 20FR 12 (CANNULA) ×2 IMPLANT
CATH ROBINSON RED A/P 18FR (CATHETERS) ×4 IMPLANT
CLIP RETRACTION 3.0MM CORONARY (MISCELLANEOUS) IMPLANT
CLIP TI MEDIUM 24 (CLIP) IMPLANT
CLIP TI WIDE RED SMALL 24 (CLIP) IMPLANT
CONN ST 1/2X1/2 BEN (MISCELLANEOUS) ×2 IMPLANT
CONNECTOR BLAKE 2:1 CARIO BLK (MISCELLANEOUS) ×2 IMPLANT
CONTAINER PROTECT SURGISLUSH (MISCELLANEOUS) ×4 IMPLANT
DERMABOND ADVANCED .7 DNX12 (GAUZE/BANDAGES/DRESSINGS) IMPLANT
DRAIN CHANNEL 19F RND (DRAIN) ×6 IMPLANT
DRAIN CONNECTOR BLAKE 1:1 (MISCELLANEOUS) ×2 IMPLANT
DRAPE CARDIOVASCULAR INCISE (DRAPES) ×2
DRAPE INCISE IOBAN 66X45 STRL (DRAPES) IMPLANT
DRAPE SRG 135X102X78XABS (DRAPES) ×2 IMPLANT
DRAPE WARM FLUID 44X44 (DRAPES) ×2 IMPLANT
DRSG AQUACEL AG ADV 3.5X10 (GAUZE/BANDAGES/DRESSINGS) ×2 IMPLANT
ELECT BLADE 4.0 EZ CLEAN MEGAD (MISCELLANEOUS) ×2
ELECT REM PT RETURN 9FT ADLT (ELECTROSURGICAL) ×4
ELECTRODE BLDE 4.0 EZ CLN MEGD (MISCELLANEOUS) ×2 IMPLANT
ELECTRODE REM PT RTRN 9FT ADLT (ELECTROSURGICAL) ×4 IMPLANT
FELT TEFLON 1X6 (MISCELLANEOUS) ×4 IMPLANT
GAUZE 4X4 16PLY ~~LOC~~+RFID DBL (SPONGE) ×2 IMPLANT
GAUZE SPONGE 4X4 12PLY STRL (GAUZE/BANDAGES/DRESSINGS) ×4 IMPLANT
GAUZE SPONGE 4X4 12PLY STRL LF (GAUZE/BANDAGES/DRESSINGS) IMPLANT
GLOVE BIO SURGEON STRL SZ 6.5 (GLOVE) IMPLANT
GLOVE BIO SURGEON STRL SZ7 (GLOVE) ×4 IMPLANT
GLOVE BIO SURGEON STRL SZ7.5 (GLOVE) IMPLANT
GLOVE BIOGEL M STRL SZ7.5 (GLOVE) ×4 IMPLANT
GLOVE BIOGEL PI IND STRL 6 (GLOVE) IMPLANT
GLOVE BIOGEL PI IND STRL 7.5 (GLOVE) IMPLANT
GLOVE SS BIOGEL STRL SZ 6 (GLOVE) IMPLANT
GLOVE SURG SS PI 6.5 STRL IVOR (GLOVE) IMPLANT
GOWN STRL REUS W/ TWL LRG LVL3 (GOWN DISPOSABLE) ×8 IMPLANT
GOWN STRL REUS W/ TWL XL LVL3 (GOWN DISPOSABLE) ×4 IMPLANT
GOWN STRL REUS W/TWL LRG LVL3 (GOWN DISPOSABLE) ×12
GOWN STRL REUS W/TWL XL LVL3 (GOWN DISPOSABLE) ×4
HEMOSTAT POWDER SURGIFOAM 1G (HEMOSTASIS) ×4 IMPLANT
HEMOSTAT SURGICEL 2X14 (HEMOSTASIS) IMPLANT
INSERT FOGARTY XLG (MISCELLANEOUS) IMPLANT
INSERT SUTURE HOLDER (MISCELLANEOUS) ×2 IMPLANT
KIT BASIN OR (CUSTOM PROCEDURE TRAY) ×2 IMPLANT
KIT SUCTION CATH 14FR (SUCTIONS) ×2 IMPLANT
KIT TURNOVER KIT B (KITS) ×2 IMPLANT
KIT VASOVIEW HEMOPRO 2 VH 4000 (KITS) ×2 IMPLANT
LEAD PACING MYOCARDI (MISCELLANEOUS) ×2 IMPLANT
MARKER GRAFT CORONARY BYPASS (MISCELLANEOUS) ×6 IMPLANT
NS IRRIG 1000ML POUR BTL (IV SOLUTION) ×10 IMPLANT
PACK E OPEN HEART (SUTURE) ×2 IMPLANT
PACK OPEN HEART (CUSTOM PROCEDURE TRAY) ×2 IMPLANT
PAD ARMBOARD 7.5X6 YLW CONV (MISCELLANEOUS) ×4 IMPLANT
PAD ELECT DEFIB RADIOL ZOLL (MISCELLANEOUS) ×2 IMPLANT
PENCIL BUTTON HOLSTER BLD 10FT (ELECTRODE) ×2 IMPLANT
POSITIONER HEAD DONUT 9IN (MISCELLANEOUS) ×2 IMPLANT
PUNCH AORTIC ROTATE 4.0MM (MISCELLANEOUS) ×2 IMPLANT
SET MPS 3-ND DEL (MISCELLANEOUS) IMPLANT
SOLUTION ANTFG W/FOAM PAD STRL (MISCELLANEOUS) IMPLANT
SPONGE T-LAP 18X18 ~~LOC~~+RFID (SPONGE) ×8 IMPLANT
SUPPORT HEART JANKE-BARRON (MISCELLANEOUS) ×2 IMPLANT
SUT BONE WAX W31G (SUTURE) ×2 IMPLANT
SUT ETHIBOND X763 2 0 SH 1 (SUTURE) ×4 IMPLANT
SUT MNCRL AB 3-0 PS2 18 (SUTURE) ×4 IMPLANT
SUT MNCRL AB 4-0 PS2 18 (SUTURE) IMPLANT
SUT PDS AB 1 CTX 36 (SUTURE) ×4 IMPLANT
SUT PROLENE 4 0 RB 1 (SUTURE) ×2
SUT PROLENE 4 0 SH DA (SUTURE) ×2 IMPLANT
SUT PROLENE 4-0 RB1 .5 CRCL 36 (SUTURE) IMPLANT
SUT PROLENE 5 0 C 1 36 (SUTURE) ×6 IMPLANT
SUT PROLENE 7 0 BV 1 (SUTURE) IMPLANT
SUT PROLENE 7 0 BV1 MDA (SUTURE) ×2 IMPLANT
SUT STEEL 6MS V (SUTURE) ×4 IMPLANT
SUT STEEL STERNAL CCS#1 18IN (SUTURE) IMPLANT
SUT VIC AB 2-0 CT1 27 (SUTURE) ×2
SUT VIC AB 2-0 CT1 TAPERPNT 27 (SUTURE) IMPLANT
SYSTEM SAHARA CHEST DRAIN ATS (WOUND CARE) ×2 IMPLANT
TAPE CLOTH SURG 4X10 WHT LF (GAUZE/BANDAGES/DRESSINGS) IMPLANT
TAPE PAPER 2X10 WHT MICROPORE (GAUZE/BANDAGES/DRESSINGS) IMPLANT
TOWEL GREEN STERILE (TOWEL DISPOSABLE) ×2 IMPLANT
TOWEL GREEN STERILE FF (TOWEL DISPOSABLE) ×2 IMPLANT
TRAY FOLEY SLVR 16FR TEMP STAT (SET/KITS/TRAYS/PACK) ×2 IMPLANT
TUBING LAP HI FLOW INSUFFLATIO (TUBING) ×2 IMPLANT
UNDERPAD 30X36 HEAVY ABSORB (UNDERPADS AND DIAPERS) ×2 IMPLANT
WATER STERILE IRR 1000ML POUR (IV SOLUTION) ×4 IMPLANT

## 2023-05-16 NOTE — Discharge Summary (Signed)
Physician Discharge Summary  Patient ID: Scott Mcguire MRN: [id number] DOB/AGE: 06-27-1971 52 y.o.  Admit date: 05/16/2023 Discharge date: 05/21/2023  Admission Diagnoses:  Multivessel coronary artery disease Hypertension Dyslipidemia History of paroxysmal atrial tachycardia Obesity  Discharge Diagnoses:   Multivessel coronary artery disease Hypertension Dyslipidemia History of paroxysmal atrial tachycardia Obesity S/P CABG x 3   Discharged Condition: stable  History of Present Illness:     Scott Mcguire is a 52 y.o. 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 He has a past medical history of minimal nonobstructive CAD on coronary CTA, hypertension, migraines, paroxysmal atrial tachycardia, chronic prostatitis, hyperlipidemia, anxiety and he is legally blind. Echocardiogram in August 2021 revealed an EF of 55-60%, mild concentric LVH, trace MR, and mild TR. Coronary CTA calcium score in 2022 was 8.6, proving minimal CAD. He presents with complaints of chest pain and shortness of breath with exertion. His most recent coronary CTA calcium score on 06/27 placed him in the 77th percentile suggesting flow limiting stenosis in the LAD and left circumflex. Cardiac catheterization on 07/03 showed LAD with 50-90% stenosis, ramus 1 with 80% stenosis, ramus 2 with 50% stenosis and circumflex with 40-50% stenosis. Echocardiogram showed a 60-65% LVEF without valve abnormalities.  Dr. Cliffton Asters reviewed the patient's diagnostic studies and determined he would benefit from surgical intervention. He reviewed the treatment options as well as the risks and benefits of surgery. Scott Mcguire was agreeable to proceed with surgery.  Hospital Course: Scott Mcguire arrived at [facility] and was brought to the operating room in stable condition. He underwent CABG x 3 utilizing LIMA to LAD, SVG to Ramus and SVG to Diagonal. He tolerated the procedure well and was transferred  to the SICU in stable condition. He remained hemodynamically stable.  He was weaned from the mechanical vent support and extubated by 9pm on the day of surgery.  Monitoring lines and drains were removed routinely and he was mobilized on the first post-op day. Diuresis was begun on post-op day 2 for expected volume excess and he was transferred to [facility].  He was mildly tachycardic and his Lopressor dose was increased.  He complained of some congestion and was started on Mucinex.  He has a history of Atrial Tachycardia.  Despite increase in Lopressor his HR did not improve.  His home Cardizem was resumed.  His heart rate improved.  He is ambulating without difficulty.  His surgical incisions are healing without evidence of infection.  He is medically stable for discharge.   Consults: pulmonary/intensive care  Significant Diagnostic Studies: angiography:     Prox LAD to Mid LAD lesion is 70% stenosed.   Ost LAD to Prox LAD lesion is 50% stenosed.   Mid LAD lesion is 90% stenosed.   Ramus-1 lesion is 80% stenosed.   Ramus-2 lesion is 50% stenosed.   Ost Cx lesion is 40% stenosed.   Mid Cx lesion is 50% stenosed.   The left ventricular systolic function is normal.   LV end diastolic pressure is normal.   The left ventricular ejection fraction is 55-65% by visual estimate.   There is no aortic valve stenosis.   In the absence of any other complications or medical issues, we expect the patient to be ready for discharge from a cath perspective on 04/04/2023.   Multivessel coronary artery disease with the most severe disease being in the mid LAD and the ostial ramus branch.  There is another significant lesion in the proximal LAD.  The ostial to proximal LAD has diffuse, moderate disease.  There is moderate disease in the mid circumflex.   Cath works FFR was performed which showed that the circumflex disease was not significant. LAD FFR dropped to 0.72 in the mid vessel and 0.69 after all of  the significant disease. The ramus FFR dropped to 0.83 after the initial lesion and to 0.77 in the mid vessel. Given the moderate LAD disease in the proximal vessel extends all the way back to the left main, landing the stent with strut hanging in the left main may further compromise the ramus vessel.  It would be a long area of stent.   Will obtain cardiac surgical consult for possible bypass. One option would be hybrid option with LIMA to LAD (would check with surgery if robotic option is available).  Consider medical therapy of the ramus and if symptoms persisted, could pursue PCI of the ostial ramus.  Since the LAD would be protected, this would be feasible.  There may be some shift into the circumflex vessel but this is a relatively small vessel.   He will need aggressive medical therapy including aspirin and statin.    Treatments: Surgery  05/16/2023 Patient:  Scott Mcguire Pre-Op Dx: Coronary artery disease             HTN             HLP             Obesity Post-op Dx:  same Procedure: CABG X 3.  LIMA LAD, RSVG Ramus, diagonal   Endoscopic greater saphenous vein harvest on the right     Surgeon and Role:      * Lightfoot, Eliezer Lofts, MD - Primary    * B. Stehler , PA-C - assisting  Anesthesia  general EBL:  Blood Administration: none Xclamp Time:  46 min Pump Time:    Drains: 19 F blake drain: R, L, mediastinal  Wires: ventricular Counts: correct     Indications: 52yo male with 2V CAD, preserved biventricular function, and no significant valvular disease. He has good targets for bypass. There does not appear to be a good target on his lateral wall, but he has dominant ramus branch, and large diagonals. We discussed the risks and benefits of surgical bypass. He would like to proceed.    Findings: Good LIMA and vein.  Good LAD.  Small diagonal and ramus  Discharge Exam: Blood pressure (!) 156/106, pulse (!) 110, temperature 98.5 F (36.9 C), resp.  rate 19, height 5\' 10"  (1.778 m), weight 89.4 kg, SpO2 96%.  General appearance: alert, cooperative, and no distress Neurologic: intact Heart: Sinus tach, 110-120. Lungs: clear to auscultation bilaterally Abdomen: soft, NT Extremities: no peripheral edema, the RLE EVH incision is intact and dry Wound: the sternotomy incision is intact and dry.  Disposition:   Discharge Instructions     Amb Referral to Cardiac Rehabilitation   Complete by: As directed    To Wink   Diagnosis: CABG   CABG X ___: 3   After initial evaluation and assessments completed: Virtual Based Care may be provided alone or in conjunction with Phase 2 Cardiac Rehab based on patient barriers.: Yes   Intensive Cardiac Rehabilitation (ICR) [facility] location only OR Traditional Cardiac Rehabilitation (TCR) *If criteria for ICR are not met will enroll in TCR [facility] only): Yes      Allergies as of 05/21/2023       Reactions   Ceftin [  cefuroxime] Swelling, Other (See Comments)   Dyspnea, Throat swelling, Blisters in throat        Medication List     STOP taking these medications    hydrochlorothiazide 12.5 MG capsule Commonly known as: MICROZIDE   ibuprofen 200 MG tablet Commonly known as: ADVIL   isosorbide mononitrate 30 MG 24 hr tablet Commonly known as: IMDUR   propranolol 20 MG tablet Commonly known as: INDERAL       TAKE these medications    acetaminophen 500 MG tablet Commonly known as: TYLENOL Take 1,000 mg by mouth every 6 (six) hours as needed (pain/headaches.).   Alaway 0.035 % ophthalmic solution Generic drug: ketotifen Place 1 drop into both eyes 2 (two) times daily as needed (allergy eyes/irritation.).   ALPRAZolam 1 MG tablet Commonly known as: XANAX Take 1 mg by mouth at bedtime.   aspirin EC 325 MG tablet Take 1 tablet (325 mg total) by mouth daily. What changed:  medication strength how much to take when to take this additional instructions   atorvastatin 80 MG  tablet Commonly known as: LIPITOR Take 80 mg by mouth in the morning.   cetirizine 10 MG tablet Commonly known as: ZYRTEC Take 10 mg by mouth daily as needed for allergies.   diltiazem 240 MG 24 hr capsule Commonly known as: CARDIZEM CD Take 1 capsule (240 mg total) by mouth daily.   ezetimibe 10 MG tablet Commonly known as: ZETIA Take 1 tablet (10 mg total) by mouth at bedtime.   furosemide 40 MG tablet Commonly known as: LASIX Take 1 tablet (40 mg total) by mouth daily.   guaiFENesin 600 MG 12 hr tablet Commonly known as: MUCINEX Take 1 tablet (600 mg total) by mouth 2 (two) times daily.   metoprolol tartrate 25 MG tablet Commonly known as: LOPRESSOR Take 1 tablet (25 mg total) by mouth 2 (two) times daily.   nitroGLYCERIN 0.4 MG SL tablet Commonly known as: NITROSTAT Place 1 tablet (0.4 mg total) under the tongue every 5 (five) minutes as needed for chest pain.   omeprazole 40 MG capsule Commonly known as: PRILOSEC Take 40 mg by mouth daily before breakfast.   oxyCODONE 5 MG immediate release tablet Commonly known as: Oxy IR/ROXICODONE Take 1 tablet (5 mg total) by mouth every 4 (four) hours as needed for severe pain.               Durable Medical Equipment  (From admission, onward)           Start     Ordered   05/20/23 0748  For home use only DME Walker rolling  Once       Question Answer Comment  Walker: With 5 Inch Wheels   Patient needs a walker to treat with the following condition S/P CABG x 3   Patient needs a walker to treat with the following condition Physical deconditioning      05/20/23 0747            Follow-up Information     [name], MD Follow up on 06/01/2023.   Specialty: Cardiothoracic Surgery Why: This is a virtual appointment. Do not go to the office.  Dr. Cliffton Asters will call you around 2pm. Contact information: [address] [place] [address] [phone]         [facility]  [facility] at [place]. Go on 06/11/2023.   Specialty: Cardiology Why: Your appointment with Wallis Bamberg, NP is at 2:20pm. Contact information:  [address] [place] [address] [phone]                The patient has been discharged on:   1.Beta Blocker:  Yes [ x ]                              No   [   ]                              If No, reason:  2.Ace Inhibitor/ARB: Yes [   ]                                     No  [ x ]                                     If No, reason:  3.Statin:   Yes [ x ]                  No  [   ]                  If No, reason:  4.Ecasa:  Yes  [ x]                  No   [   ]                  If No, reason:  5. ACS on Admission?  No  P2Y12 Inhibitor:  Yes  [   ]                                No  [x ]    Signed: Leary Roca, PA-C 05/21/2023, 7:42 AM

## 2023-05-16 NOTE — Anesthesia Procedure Notes (Addendum)
Central Venous Catheter Insertion Performed by: Beryle Lathe, MD, anesthesiologist Start/End8/14/2024 8:06 AM, 05/16/2023 8:12 AM Preanesthetic checklist: patient identified, IV checked, risks and benefits discussed, surgical consent, monitors and equipment checked, pre-op evaluation, timeout performed and anesthesia consent Position: Trendelenburg Lidocaine 1% used for infiltration and patient sedated Hand hygiene performed , maximum sterile barriers used  and Seldinger technique used Catheter size: 8.5 Fr Central line was placed.Sheath introducer Procedure performed using ultrasound guided technique. Ultrasound Notes:anatomy identified, needle tip was noted to be adjacent to the nerve/plexus identified, no ultrasound evidence of intravascular and/or intraneural injection and image(s) printed for medical record Attempts: 1 Following insertion, line sutured, dressing applied and Biopatch. Post procedure assessment: blood return through all ports, free fluid flow and no air  Patient tolerated the procedure well with no immediate complications. Additional procedure comments: SLIC placed via swan port.

## 2023-05-16 NOTE — Anesthesia Procedure Notes (Signed)
Arterial Line Insertion Start/End8/14/2024 8:05 AM Performed by: Garfield Cornea, CRNA, CRNA  Patient location: Pre-op. Preanesthetic checklist: patient identified, IV checked, site marked, risks and benefits discussed, surgical consent, monitors and equipment checked, pre-op evaluation, timeout performed and anesthesia consent Lidocaine 1% used for infiltration Left, radial was placed Catheter size: 20 G Hand hygiene performed  and maximum sterile barriers used   Attempts: 1 Procedure performed without using ultrasound guided technique. Following insertion, dressing applied and Biopatch. Post procedure assessment: normal and unchanged  Patient tolerated the procedure well with no immediate complications.

## 2023-05-16 NOTE — Procedures (Signed)
Extubation Procedure Note  Patient Details:   Name: Scott Mcguire DOB: [birth year] MRN: [id number]   Airway Documentation:    Vent end date: 05/16/23 Vent end time: 2100   Evaluation  O2 sats: stable throughout Complications: No apparent complications Patient did tolerate procedure well. Bilateral Breath Sounds: Clear, Diminished   Yes   Patient performed a NIF -30 and VC 900 mL.  Patient was extubated to 4L nasal cannula.  Patient trial at incentive spirometer 300 mL.  RT advised patient that he would need to continue to work on the breathing exercise.    [name] A 05/16/2023, 9:07 PM

## 2023-05-16 NOTE — Discharge Instructions (Addendum)
Discharge Instructions:  1. You may shower, please wash incisions daily with soap and water and keep dry.  If you wish to cover wounds with dressing you may do so but please keep clean and change daily.  No tub baths or swimming until incisions have completely healed.  If your incisions become red or develop any drainage please call our office at [phone]  2. Monitor your weight daily.. Please use the same scale and weigh at same time... If you gain 5-10 lbs in 48 hours with associated lower extremity swelling, please contact our office at [phone]  3. Fever of 101.5 for at least 24 hours with no source, please contact our office at [phone]  4. Activity- up as tolerated, please walk at least 3 times per day.  Avoid strenuous activity, no lifting, pushing, or pulling with your arms over 8-10 lbs for a minimum of 6 weeks  5. If any questions or concerns arise, please do not hesitate to contact our office at [phone]

## 2023-05-16 NOTE — H&P (Signed)
[address]       [place] [address]             [phone]                                        SULAYMAN NABOZNY [facility] Medical Record #[id number] Date of Birth: October 29, 1970   Referring: Corky Crafts, MD Primary Care: Galvin Proffer, MD Primary Cardiologist:Kardie Tobb, DO   Chief Complaint:        Chief Complaint  Patient presents with   Coronary Artery Disease      Cath and echo 7/3      History of Present Illness:     Scott Mcguire 52 y.o. 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  He is legally blind, and has had worsening shortness of breath, and exertional anginal symptoms.       Past Medical and Surgical History: Previous Chest Surgery: no Previous Chest Radiation: no Diabetes Mellitus: no.  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   Tachycardia                 Past Surgical History:  Procedure Laterality Date   LEFT HEART CATH AND CORONARY ANGIOGRAPHY N/A 04/04/2023    Procedure: LEFT HEART CATH AND CORONARY ANGIOGRAPHY;  Surgeon: Corky Crafts, MD;  Location: [facility] INV[facility] CV [facility];  Service: Cardiovascular;  Laterality: N/A;   NO PAST SURGERIES              Social History:   Tobacco Use History  Social History       Tobacco Use  Smoking Status Never  Smokeless Tobacco Never      Social History       Substance and Sexual Activity  Alcohol Use Never        Allergies       Allergies  Allergen Reactions   Cefuroxime Swelling   Cefuroxime Axetil        Other reaction(s): Other (See  Comments) Dyspnea, Throat swelling, Blisters in throat                  Current Outpatient Medications  Medication Sig Dispense Refill   ALPRAZolam (XANAX) 1 MG tablet Take 1 mg by mouth at bedtime as needed.        aspirin EC 81 MG tablet Take 81 mg by mouth daily. Swallow whole.       atorvastatin (LIPITOR) 80 MG tablet Take 80 mg by mouth daily.       cetirizine (ZYRTEC) 5 MG chewable tablet Chew 5 mg by mouth daily.       diltiazem (CARDIZEM CD) 240 MG 24 hr capsule Take 1 capsule (240 mg total) by mouth daily. 90 capsule 3   ezetimibe (ZETIA) 10 MG tablet Take 1 tablet (10 mg total) by mouth at bedtime. 90 tablet 3  hydrochlorothiazide (MICROZIDE) 12.5 MG capsule Take 1 capsule (12.5 mg total) by mouth daily. 15 capsule 0   nitroGLYCERIN (NITROSTAT) 0.4 MG SL tablet Place 1 tablet (0.4 mg total) under the tongue every 5 (five) minutes as needed for chest pain. 25 tablet 1   omeprazole (PRILOSEC) 40 MG capsule Take 40 mg by mouth daily.       propranolol (INDERAL) 20 MG tablet Take one tablet by mouth as needed if heart rate greater then 120 30 tablet 2      No current facility-administered medications for this visit.         (Not in a hospital admission)              Family History  Problem Relation Age of Onset   Heart disease Mother     Heart disease Father              Review of Systems:    Review of Systems  Constitutional:  Positive for malaise/fatigue.  Respiratory:  Positive for shortness of breath.   Cardiovascular:  Positive for chest pain.  Neurological: Negative.                    Physical Exam: BP (!) 138/100 (BP Location: Left Arm, Patient Position: Sitting)   Pulse 66   Resp 20   Ht 5\' 10"  (1.778 m)   Wt 215 lb (97.5 kg)   SpO2 100% Comment: RA  BMI 30.85 kg/m  Physical Exam Constitutional:      General: He is not in acute distress.    Appearance: He is not ill-appearing.  HENT:     Head: Normocephalic and atraumatic.  Eyes:      Extraocular Movements: Extraocular movements intact.  Cardiovascular:     Rate and Rhythm: Normal rate.  Pulmonary:     Effort: Pulmonary effort is normal. No respiratory distress.  Musculoskeletal:        General: Normal range of motion.     Cervical back: Normal range of motion.  Skin:    General: Skin is warm and dry.  Neurological:     General: No focal deficit present.     Mental Status: He is alert and oriented to person, place, and time.            I have independently reviewed the above radiologic studies and discussed with the patient    Recent Lab Findings: Recent Labs       Lab Results  Component Value Date    WBC 7.9 04/03/2023    HGB 16.2 04/03/2023    HCT 47.3 04/03/2023    PLT 363 04/03/2023    GLUCOSE 95 04/03/2023    LDLDIRECT 99 04/03/2023    ALT 14 04/03/2023    AST 17 04/03/2023    NA 137 04/03/2023    K 3.9 04/03/2023    CL 99 04/03/2023    CREATININE 1.17 04/03/2023    BUN 14 04/03/2023    CO2 23 04/03/2023            Assessment / Plan:   52yo male with 2V CAD, preserved biventricular function, and no significant valvular disease.  He has good targets for bypass.  There does not appear to be a good target on his lateral wall, but he has dominant ramus branch, and large diagonals.  We discussed the risks and benefits of surgical bypass.  He would like to proceed.  He needs some time to plan things out with  his job.           I  spent 40 minutes counseling the patient face to face.     Alston Berrie Keane Scrape

## 2023-05-16 NOTE — Anesthesia Procedure Notes (Signed)
Procedure Name: Intubation Date/Time: 05/16/2023 8:53 AM  Performed by: Garfield Cornea, CRNAPre-anesthesia Checklist: Patient identified, Emergency Drugs available, Suction available and Patient being monitored Patient Re-evaluated:Patient Re-evaluated prior to induction Oxygen Delivery Method: Circle System Utilized Preoxygenation: Pre-oxygenation with 100% oxygen Induction Type: IV induction Ventilation: Mask ventilation without difficulty Laryngoscope Size: Mac and 4 Grade View: Grade I Tube type: Oral Tube size: 8.0 mm Number of attempts: 1 Airway Equipment and Method: Stylet and Oral airway Placement Confirmation: ETT inserted through vocal cords under direct vision, positive ETCO2 and breath sounds checked- equal and bilateral Secured at: 22 cm Tube secured with: Tape Dental Injury: Teeth and Oropharynx as per pre-operative assessment

## 2023-05-16 NOTE — Hospital Course (Addendum)
History of Present Illness:     ELIAZAR DANKENBRING is a 52 y.o. 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 He has a past medical history of minimal nonobstructive CAD on coronary CTA, hypertension, migraines, paroxysmal atrial tachycardia, chronic prostatitis, hyperlipidemia, anxiety and he is legally blind. Echocardiogram in August 2021 revealed an EF of 55-60%, mild concentric LVH, trace MR, and mild TR. Coronary CTA calcium score in 2022 was 8.6, proving minimal CAD. He presents with complaints of chest pain and shortness of breath with exertion. His most recent coronary CTA calcium score on 06/27 placed him in the 77th percentile suggesting flow limiting stenosis in the LAD and left circumflex. Cardiac catheterization on 07/03 showed LAD with 50-90% stenosis, ramus 1 with 80% stenosis, ramus 2 with 50% stenosis and circumflex with 40-50% stenosis. Echocardiogram showed a 60-65% LVEF without valve abnormalities.  Dr. Cliffton Asters reviewed the patient's diagnostic studies and determined he would benefit from surgical intervention. He reviewed the treatment options as well as the risks and benefits of surgery. Mr. Escareno was agreeable to proceed with surgery.  Hospital Course: Mr. Israelson arrived at [facility] and was brought to the operating room in stable condition. He underwent CABG x 3 utilizing LIMA to LAD, SVG to Ramus and SVG to Diagonal. He tolerated the procedure well and was transferred to the SICU in stable condition. He remained hemodynamically stable.  He was weaned from the mechanical vent support and extubated by 9pm on the day of surgery.  Monitoring lines and drains were removed routinely and he was mobilized on the first post-op day. Diuresis was begun on post-op day 2 for expected volume excess and he was transferred to [facility].

## 2023-05-16 NOTE — Progress Notes (Signed)
RN advised RT that MD gave verbal order to extubate patient with the following ABG:   Latest Reference Range & Units 05/16/23 20:43  Sample type  ARTERIAL  pH, Arterial 7.35 - 7.45  7.278 (L)  pCO2 arterial 32 - 48 mmHg 44.5  pO2, Arterial 83 - 108 mmHg 144 (H)  TCO2 22 - 32 mmol/L 22  Acid-base deficit 0.0 - 2.0 mmol/L 6.0 (H)  Bicarbonate 20.0 - 28.0 mmol/L 20.5  O2 Saturation % 99  Patient temperature  38.2 C   Patient was able to perform weaning parameters; NIF -30 and VC 900 mL with good effort.

## 2023-05-16 NOTE — Brief Op Note (Signed)
05/16/2023  1:10 PM  PATIENT:  [name]  52 y.o. male  PRE-OPERATIVE DIAGNOSIS:  Coronary Artery Disease  POST-OPERATIVE DIAGNOSIS:  Coronary Artery Disease  PROCEDURE:  CORONARY ARTERY BYPASS GRAFTING (CABG) X3 USING LEFT INTERNAL MAMMARY ARTERY (LIMA) AND ENDOSCOPICALLY HARVESTED RIGHT GREATER SAPHENOUS VEIN  TRANSESOPHAGEAL ECHOCARDIOGRAM  Vein harvest time: Vein prep time: -LIMA to LAD -SVG to Ramus -SVG to Diagonal  SURGEON:  Surgeons and Role:    * [name], MD - Primary  PHYSICIAN ASSISTANT: Aloha Gell PA-C  ASSISTANTS: Tanda Rockers RNFA, Valene Bors RNFA student   ANESTHESIA:   general  EBL:  951 mL   BLOOD ADMINISTERED:none  DRAINS:  mediastinal drains    LOCAL MEDICATIONS USED:  NONE  SPECIMEN:  No Specimen  DISPOSITION OF SPECIMEN:  N/A  COUNTS:  YES  DICTATION: .Dragon Dictation  PLAN OF CARE: Admit to inpatient   PATIENT DISPOSITION:  ICU - intubated and hemodynamically stable.   Delay start of Pharmacological VTE agent (>24hrs) due to surgical blood loss or risk of bleeding: yes

## 2023-05-16 NOTE — Anesthesia Postprocedure Evaluation (Signed)
Anesthesia Post Note  Patient: Scott Mcguire  Procedure(s) Performed: CORONARY ARTERY BYPASS GRAFTING (CABG) X3 USING LEFT INTERNAL MAMMARY ARTERY (LIMA) AND ENDOSCOPICALLY HARVESTED RIGHT GREATER SAPHENOUS VEIN (Chest) TRANSESOPHAGEAL ECHOCARDIOGRAM     Patient location during evaluation: ICU Anesthesia Type: General Level of consciousness: sedated and patient remains intubated per anesthesia plan Pain management: pain level controlled Vital Signs Assessment: post-procedure vital signs reviewed and stable Respiratory status: patient remains intubated per anesthesia plan Cardiovascular status: stable Postop Assessment: no apparent nausea or vomiting Anesthetic complications: no   No notable events documented.  Last Vitals:  Vitals:   05/16/23 1500 05/16/23 1600  BP: 111/83 107/78  Pulse: 80 83  Resp: 16 16  Temp: (!) 35.8 C (!) 35.9 C  SpO2: 100% 100%    Last Pain:  Vitals:   05/16/23 1600  TempSrc:   PainSc: 0-No pain                 Beryle Lathe

## 2023-05-16 NOTE — Transfer of Care (Signed)
Immediate Anesthesia Transfer of Care Note  Patient: Scott Mcguire  Procedure(s) Performed: CORONARY ARTERY BYPASS GRAFTING (CABG) X3 USING LEFT INTERNAL MAMMARY ARTERY (LIMA) AND ENDOSCOPICALLY HARVESTED RIGHT GREATER SAPHENOUS VEIN (Chest) TRANSESOPHAGEAL ECHOCARDIOGRAM  Patient Location: ICU  Anesthesia Type:General  Level of Consciousness: sedated and Patient remains intubated per anesthesia plan  Airway & Oxygen Therapy: Patient remains intubated per anesthesia plan and Patient placed on Ventilator (see vital sign flow sheet for setting)  Post-op Assessment: Report given to RN and Post -op Vital signs reviewed and stable  Post vital signs: Reviewed and stable  Last Vitals:  Vitals Value Taken Time  BP    Temp    Pulse 82 05/16/23 1346  Resp 16 05/16/23 1346  SpO2 99 % 05/16/23 1346  Vitals shown include unfiled device data.  Last Pain:  Vitals:   05/16/23 0735  TempSrc: Oral  PainSc:       Patients Stated Pain Goal: 0 (05/16/23 0715)  Complications: No notable events documented.

## 2023-05-16 NOTE — Progress Notes (Signed)
Rapid wean initiated per protocol  

## 2023-05-16 NOTE — Op Note (Signed)
[address]       [place] [address]             [phone]                                          05/16/2023 Patient:  Scott Mcguire Pre-Op Dx: Coronary artery disease  HTN  HLP  Obesity Post-op Dx:  same Procedure: CABG X 3.  LIMA LAD, RSVG Ramus, diagonal   Endoscopic greater saphenous vein harvest on the right   Surgeon and Role:      * Kyi Romanello, Eliezer Lofts, MD - Primary    * B. Stehler , PA-C - assisting An experienced assistant was required given the complexity of this surgery and the standard of surgical care. The assistant was needed for exposure, dissection, suctioning, retraction of delicate tissues and sutures, instrument exchange and for overall help during this procedure.    Anesthesia  general EBL:  Blood Administration: none Xclamp Time:  46 min Pump Time:   Drains: 19 F blake drain: R, L, mediastinal  Wires: ventricular Counts: correct   Indications: 52yo male with 2V CAD, preserved biventricular function, and no significant valvular disease. He has good targets for bypass. There does not appear to be a good target on his lateral wall, but he has dominant ramus branch, and large diagonals. We discussed the risks and benefits of surgical bypass. He would like to proceed.   Findings: Good LIMA and vein.  Good LAD.  Small diagonal and ramus  Operative Technique: All invasive lines were placed in pre-op holding.  After the risks, benefits and alternatives were thoroughly discussed, the patient was brought to the operative theatre.  Anesthesia was induced, and the patient was prepped and draped in normal sterile fashion.  An appropriate surgical pause was performed, and pre-operative antibiotics were dosed accordingly.  We began with simultaneous incisions along the right leg for harvesting of the greater saphenous vein and the chest for the sternotomy.  In regards to the sternotomy, this was carried down with  bovie cautery, and the sternum was divided with a reciprocating saw.  Meticulous hemostasis was obtained.  The left internal thoracic artery was exposed and harvested in in pedicled fashion.  The patient was systemically heparinized, and the artery was divided distally, and placed in a papaverine sponge.    The sternal elevator was removed, and a retractor was placed.  The pericardium was divided in the midline and fashioned into a cradle with pericardial stitches.   After we confirmed an appropriate ACT, the ascending aorta was cannulated in standard fashion.  The right atrial appendage was used for venous cannulation site.  Cardiopulmonary bypass was initiated, and the heart retractor was placed. The cross clamp was applied, and a dose of anterograde cardioplegia was given with good arrest of the heart.  Next we exposed the lateral wall, and found a good target on the ramus.  An end to side anastomosis with the vein graft was then created.  Next, we exposed the anterior wall of the heart and identified a good target on diagonal.   An arteriotomy was created.  The vein was anastomosed in an end to side fashion.  Finally, we exposed a good target on the LAD, and fashioned an end to side anastomosis between it and the LITA.  We began  to re-warm, and a re-animation dose of cardioplegia was given.  The heart was de-aired, and the cross clamp was removed.  Meticulous hemostasis was obtained.    A partial occludding clamp was then placed on the ascending aorta, and we created an end to side anastomosis between it and the proximal vein grafts.  Rings were placed on the proximal anastomosis.  Hemostasis was obtained, and we separated from cardiopulmonary bypass without event.  The heparin was reversed with protamine.  Chest tubes and wires were placed, and the sternum was re-approximated with sternal wires.  The soft tissue and skin were re-approximated wth absorbable suture.    The patient tolerated the procedure  without any immediate complications, and was transferred to the ICU in guarded condition.  Whit Bruni Keane Scrape

## 2023-05-17 ENCOUNTER — Inpatient Hospital Stay (HOSPITAL_COMMUNITY): Payer: BC Managed Care – PPO

## 2023-05-17 ENCOUNTER — Ambulatory Visit: Payer: BC Managed Care – PPO | Admitting: Cardiology

## 2023-05-17 ENCOUNTER — Encounter (HOSPITAL_COMMUNITY): Payer: Self-pay | Admitting: Thoracic Surgery (Cardiothoracic Vascular Surgery)

## 2023-05-17 DIAGNOSIS — I10 Essential (primary) hypertension: Secondary | ICD-10-CM | POA: Diagnosis not present

## 2023-05-17 DIAGNOSIS — Z951 Presence of aortocoronary bypass graft: Secondary | ICD-10-CM | POA: Diagnosis not present

## 2023-05-17 DIAGNOSIS — Z1152 Encounter for screening for COVID-19: Secondary | ICD-10-CM | POA: Diagnosis not present

## 2023-05-17 DIAGNOSIS — I25119 Atherosclerotic heart disease of native coronary artery with unspecified angina pectoris: Secondary | ICD-10-CM | POA: Diagnosis not present

## 2023-05-17 DIAGNOSIS — E669 Obesity, unspecified: Secondary | ICD-10-CM | POA: Diagnosis not present

## 2023-05-17 LAB — POCT I-STAT 7, (LYTES, BLD GAS, ICA,H+H)
Acid-base deficit: 5 mmol/L — ABNORMAL HIGH (ref 0.0–2.0)
Bicarbonate: 21.5 mmol/L (ref 20.0–28.0)
Calcium, Ion: 1.2 mmol/L (ref 1.15–1.40)
HCT: 30 % — ABNORMAL LOW (ref 39.0–52.0)
Hemoglobin: 10.2 g/dL — ABNORMAL LOW (ref 13.0–17.0)
O2 Saturation: 98 %
Patient temperature: 98.8
Potassium: 4 mmol/L (ref 3.5–5.1)
Sodium: 141 mmol/L (ref 135–145)
TCO2: 23 mmol/L (ref 22–32)
pCO2 arterial: 45.2 mmHg (ref 32–48)
pH, Arterial: 7.287 — ABNORMAL LOW (ref 7.35–7.45)
pO2, Arterial: 116 mmHg — ABNORMAL HIGH (ref 83–108)

## 2023-05-17 LAB — CBC
HCT: 34.2 % — ABNORMAL LOW (ref 39.0–52.0)
HCT: 34.9 % — ABNORMAL LOW (ref 39.0–52.0)
Hemoglobin: 11.5 g/dL — ABNORMAL LOW (ref 13.0–17.0)
Hemoglobin: 11.6 g/dL — ABNORMAL LOW (ref 13.0–17.0)
MCH: 30.3 pg (ref 26.0–34.0)
MCH: 30.2 pg (ref 26.0–34.0)
MCHC: 33.6 g/dL (ref 30.0–36.0)
MCHC: 33.2 g/dL (ref 30.0–36.0)
MCV: 90 fL (ref 80.0–100.0)
MCV: 90.9 fL (ref 80.0–100.0)
Platelets: 185 10*3/uL (ref 150–400)
Platelets: 198 10*3/uL (ref 150–400)
RBC: 3.8 MIL/uL — ABNORMAL LOW (ref 4.22–5.81)
RBC: 3.84 MIL/uL — ABNORMAL LOW (ref 4.22–5.81)
RDW: 12.7 % (ref 11.5–15.5)
RDW: 12.7 % (ref 11.5–15.5)
WBC: 15.2 10*3/uL — ABNORMAL HIGH (ref 4.0–10.5)
WBC: 16.6 10*3/uL — ABNORMAL HIGH (ref 4.0–10.5)
nRBC: 0 % (ref 0.0–0.2)
nRBC: 0 % (ref 0.0–0.2)

## 2023-05-17 LAB — BASIC METABOLIC PANEL
Anion gap: 5 (ref 5–15)
Anion gap: 10 (ref 5–15)
BUN: 11 mg/dL (ref 6–20)
BUN: 14 mg/dL (ref 6–20)
CO2: 23 mmol/L (ref 22–32)
CO2: 22 mmol/L (ref 22–32)
Calcium: 8.1 mg/dL — ABNORMAL LOW (ref 8.9–10.3)
Calcium: 8.2 mg/dL — ABNORMAL LOW (ref 8.9–10.3)
Chloride: 108 mmol/L (ref 98–111)
Chloride: 100 mmol/L (ref 98–111)
Creatinine, Ser: 1.16 mg/dL (ref 0.61–1.24)
Creatinine, Ser: 1.39 mg/dL — ABNORMAL HIGH (ref 0.61–1.24)
GFR, Estimated: >60 mL/min (ref >60)
GFR, Estimated: >60 mL/min (ref >60)
Glucose, Bld: 112 mg/dL — ABNORMAL HIGH (ref 70–99)
Glucose, Bld: 114 mg/dL — ABNORMAL HIGH (ref 70–99)
Potassium: 4.2 mmol/L (ref 3.5–5.1)
Potassium: 4.3 mmol/L (ref 3.5–5.1)
Sodium: 136 mmol/L (ref 135–145)
Sodium: 132 mmol/L — ABNORMAL LOW (ref 135–145)

## 2023-05-17 LAB — GLUCOSE, CAPILLARY
Glucose-Capillary: 102 mg/dL — ABNORMAL HIGH (ref 70–99)
Glucose-Capillary: 103 mg/dL — ABNORMAL HIGH (ref 70–99)
Glucose-Capillary: 105 mg/dL — ABNORMAL HIGH (ref 70–99)
Glucose-Capillary: 124 mg/dL — ABNORMAL HIGH (ref 70–99)
Glucose-Capillary: 98 mg/dL (ref 70–99)
Glucose-Capillary: 98 mg/dL (ref 70–99)
Glucose-Capillary: 98 mg/dL (ref 70–99)

## 2023-05-17 LAB — MAGNESIUM
Magnesium: 2.4 mg/dL (ref 1.7–2.4)
Magnesium: 2.3 mg/dL (ref 1.7–2.4)

## 2023-05-17 MED ORDER — ALPRAZOLAM 0.5 MG PO TABS
1.0000 mg | ORAL_TABLET | Freq: Three times a day (TID) | ORAL | Status: DC | PRN
  Administered 2023-05-17 – 2023-05-18 (×4): 1 mg via ORAL
  Filled 2023-05-17 (×3): qty 2

## 2023-05-17 MED ORDER — LIDOCAINE 5 % EX PTCH
1.0000 | MEDICATED_PATCH | Freq: Every day | CUTANEOUS | Status: DC
  Administered 2023-05-17 – 2023-05-18 (×2): 1 via TRANSDERMAL
  Filled 2023-05-17 (×4): qty 1

## 2023-05-17 MED ORDER — INSULIN ASPART 100 UNIT/ML IJ SOLN: 0.0000 [IU] | INTRAMUSCULAR | Status: DC

## 2023-05-17 MED ORDER — METHOCARBAMOL 500 MG PO TABS
500.0000 mg | ORAL_TABLET | Freq: Three times a day (TID) | ORAL | Status: DC
  Administered 2023-05-17 – 2023-05-21 (×12): 500 mg via ORAL
  Filled 2023-05-17 (×12): qty 1

## 2023-05-17 MED ORDER — ENOXAPARIN SODIUM 40 MG/0.4ML IJ SOSY
40.0000 mg | PREFILLED_SYRINGE | Freq: Every day | INTRAMUSCULAR | Status: DC
  Administered 2023-05-17 – 2023-05-20 (×4): 40 mg via SUBCUTANEOUS
  Filled 2023-05-17 (×4): qty 0.4

## 2023-05-17 MED ORDER — KETOROLAC TROMETHAMINE 30 MG/ML IJ SOLN
15.0000 mg | Freq: Once | INTRAMUSCULAR | Status: AC
  Administered 2023-05-17: 15 mg via INTRAVENOUS
  Filled 2023-05-17: qty 1

## 2023-05-17 MED FILL — Lidocaine HCl Local Preservative Free (PF) Inj 2%: INTRAMUSCULAR | Qty: 14 | Status: AC

## 2023-05-17 MED FILL — Sodium Chloride Preservative Free (PF) Inj 0.9%: INTRAMUSCULAR | Qty: 10 | Status: AC

## 2023-05-17 MED FILL — Heparin Sodium (Porcine) Inj 1000 Unit/ML: Qty: 1000 | Status: AC

## 2023-05-17 MED FILL — Potassium Chloride Inj 2 mEq/ML: INTRAVENOUS | Qty: 40 | Status: AC

## 2023-05-17 MED FILL — Gelatin Absorbable MT Powder: OROMUCOSAL | Qty: 1 | Status: AC

## 2023-05-17 MED FILL — Heparin Sodium (Porcine) Inj 1000 Unit/ML: INTRAMUSCULAR | Qty: 2500 | Status: AC

## 2023-05-17 NOTE — Plan of Care (Signed)

## 2023-05-17 NOTE — Progress Notes (Signed)
Patient ID: Scott Mcguire, male   DOB: 10-Jul-1971, 52 y.o.   MRN: [id number]  TCTS Evening Rounds:  Hemodynamically stable in sinus rhythm.   Sats 98% on RA.  UO ok.  Chest tube output low.  Up in chair.

## 2023-05-17 NOTE — Consult Note (Signed)
NAME:  Scott Mcguire, MRN:  [id number], DOB:  1971/03/25, LOS: 1 ADMISSION DATE:  05/16/2023 CONSULTATION DATE:  05/17/2023 REFERRING MD:  Cliffton Asters - TCTS, CHIEF COMPLAINT:  Post-CABG   History of Present Illness:  Scott Mcguire is seen in consultation at Scott request of Dr. Cliffton Asters (TCTS) for management of mechanical ventilation and hemodynamics post-CABG x 83.  52 year old man who presented to [facility] 8/14 for scheduled CABG. PMHx significant for HTN, HLD, CAD with angina (LHC 04/2023 with multivessel CAD most severe in mid-LAD and ostial ramus/proximal LAD), migraines, anxiety, legal blindness.   Underwent CABG x 3 with Dr. Cliffton Asters on 8/14. LIMA to LAD, SVG to ramus, SVG to diagonal. Intraoperative course was uncomplicated with EBL. X-clamp time , pump time .  PCCM consulted for assistance with vent/hemodynamic management postoperatively.  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 PCCM consulted for MV/hemodynamics.  Interim History / Subjective:  PCCM consulted for vent management post-CABG.  Objective:  Blood pressure 117/80, pulse 94, temperature 98.8 F (37.1 C), temperature source Oral, resp. rate (!) 32, height 5\' 10"  (1.778 m), weight 94.1 kg, SpO2 97%. CVP:  [4 mmHg-11 mmHg] 11 mmHg CO:  [4 L/min-10.9 L/min] 6.4 L/min CI:  [1.9 L/min/m2-5.3 L/min/m2] 3.1 L/min/m2  Vent Mode: CPAP;PSV FiO2 (%):  [40 %-50 %] 40 % Set Rate:   [4 bmp-16 bmp] 4 bmp Vt Set:  [580 mL] 580 mL PEEP:  [5 cmH20] 5 cmH20 Pressure Support:  [10 cmH20] 10 cmH20   Intake/Output Summary (Last 24 hours) at 05/17/2023 1212 Last data filed at 05/17/2023 1008 Gross per 24 hour  Intake 3629.82 ml  Output 2724 ml  Net 905.82 ml   Filed Weights   05/16/23 0723 05/17/23 0600  Weight: 89.8 kg 94.1 kg   Physical Examination: General: Acutely ill-appearing middle-aged man in NAD. Appears anxious. HEENT: /AT, anicteric sclera, PERRL, moist mucous membranes. Neuro: Awake, oriented x 4. Responds to verbal stimuli. Following commands consistently. Moves all 4 extremities spontaneously. CV: Tachycardic, regular rhythm, no m/g/r. Midline sternotomy with dressing in place, c/d/i without drainage. CT in place. PULM: Breathing even and unlabored on 4LNC. Lung fields diminished, splinting/shallow breathing due to pain. GI: Soft, nontender, nondistended. Hypoactive bowel sounds. Extremities: Trace LE edema noted. Skin: Warm/dry, no rashes.  Resolved Hospital Problem List:    Assessment & Plan:  Scott Mcguire is seen in consultation at Scott request of Dr. Cliffton Asters (TCTS) for management of mechanical ventilation and hemodynamics post-CABG x 4.  52 year old man who presented to [facility] 8/14 for scheduled CABG. PMHx significant for HTN, HLD, CAD with angina (LHC 04/2023 with multivessel CAD most severe in mid-LAD and ostial ramus/proximal LAD), migraines, anxiety, legal blindness. S/p CABG x 3 with Dr. Cliffton Asters on 8/14. LIMA to LAD, SVG to ramus, SVG to diagonal.   Postoperative vent management, resolved - Extubated 8/14PM (POD#0) via SIMV/Rapid Wean protocol - Continue supplemental O2 support - Wean O2 for  sat > 90% - Pulmonary hygiene (IS/mobilize as able) - F/u ABG  S/p 3-vessel CABG and SVG (LIMA to LAD, SVG to ramus, SVG to diagonal) CAD - Monitor CI, CO via FloTrac - Neosynephrine titrated to goal MAP - Insulin gtt discontinued per protocol,  begin SSI - Continue ASA - Postoperative care per TCTS - CT management per protocol - Mulitmodal pain control with oxycodone, morphine, tramadol; maximize adjuncts as able (APAP, lido patches, Robaxin)  HTN HLD - ASA/statin, Zetia  Anxiety - Xanax TID PRN  Best Practice: (right click and "Reselect all SmartList Selections" daily)   Diet/type: Regular consistency (see orders) DVT prophylaxis: LMWH GI prophylaxis: PPI Lines: Central line and Arterial Line Foley:  Yes, and it is still needed Code Status:  full code Last date of multidisciplinary goals of care discussion [Per Primary Team]  Labs:  CBC: Recent Labs  Lab 05/14/23 1130 05/16/23 0913 05/16/23 1133 05/16/23 1217 05/16/23 1352 05/16/23 1930 05/16/23 2043 05/16/23 2211 05/17/23 0154 05/17/23 0351  WBC 6.7  --   --   --  11.3* 8.8  --   --   --  15.2*  HGB 15.7   < > 9.8*   < > 11.4* 11.2* 9.9* 9.9* 10.2* 11.5*  HCT 44.6   < > 28.1*   < > 32.2* 32.9* 29.0* 29.0* 30.0* 34.2*  MCV 87.5  --   --   --  88.2 88.0  --   --   --  90.0  PLT 299  --  216  --  165 168  --   --   --  185   < > = values in this interval not displayed.   Basic Metabolic Panel: Recent Labs  Lab 05/14/23 1130 05/16/23 0913 05/16/23 1019 05/16/23 1103 05/16/23 1132 05/16/23 1217 05/16/23 1351 05/16/23 1930 05/16/23 2043 05/16/23 2211 05/17/23 0154 05/17/23 0351  NA 133*   < > 138   < > 138 138   < > 137 142 141 141 136  K 3.2*   < > 3.2*   < > 4.0 3.6   < > 4.3 4.1 4.1 4.0 4.2  CL 99   < > 103  --  102 102  --  109  --   --   --  108  CO2 23  --   --   --   --   --   --  22  --   --   --  23  GLUCOSE 94   < > 104*  --  108* 109*  --  124*  --   --   --  112*  BUN 15   < > 14  --  13 12  --  11  --   --   --  11  CREATININE 1.18   < > 0.90  --  0.90 0.90  --  0.91  --   --   --  1.16  CALCIUM 9.3  --   --   --   --   --   --  7.5*  --   --   --  8.1*  MG  --   --   --   --   --   --   --   --   --   --   --  2.4   < > = values  in this interval not displayed.   GFR: Estimated Creatinine Clearance: 85.8 mL/min (by C-G formula based  on SCr of 1.16 mg/dL). Recent Labs  Lab 05/14/23 1130 05/16/23 1352 05/16/23 1930 05/17/23 0351  WBC 6.7 11.3* 8.8 15.2*   Liver Function Tests: Recent Labs  Lab 05/14/23 1130  AST 22  ALT 19  ALKPHOS 98  BILITOT 0.8  PROT 7.2  ALBUMIN 4.2   No results for input(s): "LIPASE", "AMYLASE" in Scott last 168 hours. No results for input(s): "AMMONIA" in Scott last 168 hours.  ABG:    Component Value Date/Time   PHART 7.287 (L) 05/17/2023 0154   PCO2ART 45.2 05/17/2023 0154   PO2ART 116 (H) 05/17/2023 0154   HCO3 21.5 05/17/2023 0154   TCO2 23 05/17/2023 0154   ACIDBASEDEF 5.0 (H) 05/17/2023 0154   O2SAT 98 05/17/2023 0154    Coagulation Profile: Recent Labs  Lab 05/14/23 1130 05/16/23 1352  INR 1.0 1.5*   Cardiac Enzymes: No results for input(s): "CKTOTAL", "CKMB", "CKMBINDEX", "TROPONINI" in Scott last 168 hours.  HbA1C: Hgb A1c MFr Bld  Date/Time Value Ref Range Status  05/14/2023 11:30 AM 5.4 4.8 - 5.6 % Final    Comment:    (NOTE)         Prediabetes: 5.7 - 6.4         Diabetes: >6.4         Glycemic control for adults with diabetes: <7.0    CBG: Recent Labs  Lab 05/16/23 2000 05/17/23 0015 05/17/23 0350 05/17/23 0813 05/17/23 1129  GLUCAP 84 102* 105* 124* 103*   Review of Systems:   Review of Systems  Constitutional:  Positive for malaise/fatigue. Negative for chills, fever and weight loss.  HENT:  Negative for congestion and sore throat.   Respiratory:  Negative for cough, hemoptysis and shortness of breath.   Cardiovascular:  Positive for chest pain. Negative for palpitations and leg swelling.  Gastrointestinal:  Negative for abdominal pain, diarrhea, heartburn, nausea and vomiting.  Genitourinary:  Negative for dysuria.  Musculoskeletal:  Positive for back pain.  Neurological:  Negative for dizziness, weakness and headaches.   Past  Medical History:  He,  has a past medical history of Anginal pain (HCC), Anxiety, Chronic prostatitis (06/02/2014), Disorder of male genital organs (06/02/2014), Dyspnea, Flank pain (06/02/2014), Hypercholesterolemia (06/02/2014), Hypertension (06/02/2014), Hypogonadism in male (06/02/2014), Migraine headache (06/02/2014), Organic impotence (04/02/2017), and Tachycardia.   Surgical History:   Past Surgical History:  Procedure Laterality Date   LEFT HEART CATH AND CORONARY ANGIOGRAPHY N/A 04/04/2023   Procedure: LEFT HEART CATH AND CORONARY ANGIOGRAPHY;  Surgeon: Corky Crafts, MD;  Location: [facility] INVASIVE CV LAB;  Service: Cardiovascular;  Laterality: N/A;   NO PAST SURGERIES     Social History:   reports that he has never smoked. He has never used smokeless tobacco. He reports that he does not drink alcohol and does not use drugs.   Family History:  His family history includes Heart disease in his father and mother.   Allergies: Allergies  Allergen Reactions   Ceftin [Cefuroxime] Swelling and Other (See Comments)    Dyspnea, Throat swelling, Blisters in throat    Home Medications: Prior to Admission medications   Medication Sig Start Date End Date Taking? Authorizing Provider  acetaminophen (TYLENOL) 500 MG tablet Take 1,000 mg by mouth every 6 (six) hours as needed (pain/headaches.).   Yes [provider]  ALPRAZolam Prudy Feeler) 1 MG tablet Take 1 mg by mouth at bedtime. 06/21/20  Yes [provider]  aspirin EC 81 MG tablet Take 81 mg by mouth in Scott morning.  Swallow whole.   Yes [provider]  atorvastatin (LIPITOR) 80 MG tablet Take 80 mg by mouth in Scott morning. 06/21/20  Yes [provider]  cetirizine (ZYRTEC) 10 MG tablet Take 10 mg by mouth daily as needed for allergies.   Yes [provider]  diltiazem (CARDIZEM CD) 240 MG 24 hr capsule Take 1 capsule (240 mg total) by mouth daily. 10/25/22  Yes Tobb, Kardie, DO  ezetimibe (ZETIA)  10 MG tablet Take 1 tablet (10 mg total) by mouth at bedtime. 03/16/23 06/14/23 Yes Flossie Dibble, NP  hydrochlorothiazide (MICROZIDE) 12.5 MG capsule Take 1 capsule (12.5 mg total) by mouth daily. 02/21/23  Yes Tobb, Kardie, DO  ibuprofen (ADVIL) 200 MG tablet Take 600-800 mg by mouth every 8 (eight) hours as needed (pain/headaches.).   Yes [provider]  isosorbide mononitrate (IMDUR) 30 MG 24 hr tablet Take 1 tablet (30 mg total) by mouth daily. 04/17/23 07/16/23 Yes Flossie Dibble, NP  ketotifen (ALAWAY) 0.035 % ophthalmic solution Place 1 drop into both eyes 2 (two) times daily as needed (allergy eyes/irritation.).   Yes [provider]  nitroGLYCERIN (NITROSTAT) 0.4 MG SL tablet Place 1 tablet (0.4 mg total) under Scott tongue every 5 (five) minutes as needed for chest pain. 04/02/23  Yes Flossie Dibble, NP  omeprazole (PRILOSEC) 40 MG capsule Take 40 mg by mouth daily before breakfast. 06/21/20  Yes [provider]  propranolol (INDERAL) 20 MG tablet Take one tablet by mouth as needed if heart rate greater then 120 10/29/20  Yes Tobb, Kardie, DO    Critical care time:    Scott Mcguire is critically ill with multiple organ system failure and requires high complexity decision making for assessment and support, frequent evaluation and titration of therapies, advanced monitoring, review of radiographic studies and interpretation of complex data.   Critical Care Time devoted to Mcguire care services, exclusive of separately billable procedures, described in this note is 32 minutes.  Tim Lair, PA-C [facility] 05/17/23 12:12 PM  Please see [email or web address] for pager details.  From 7A-7P if no response, please call [phone] After hours, please call ELink [phone]

## 2023-05-17 NOTE — Progress Notes (Addendum)
TCTS DAILY ICU PROGRESS NOTE                   [address]            [place] [address]          [phone]   1 Day Post-Op Procedure(s) (LRB): CORONARY ARTERY BYPASS GRAFTING (CABG) X3 USING LEFT INTERNAL MAMMARY ARTERY (LIMA) AND ENDOSCOPICALLY HARVESTED RIGHT GREATER SAPHENOUS VEIN (N/A) TRANSESOPHAGEAL ECHOCARDIOGRAM (N/A)  Total Length of Stay:  LOS: 1 day   Subjective: Some back discomfort  Objective: Vital signs in last 24 hours: Temp:  [96.4 F (35.8 C)-100.9 F (38.3 C)] 98.5 F (36.9 C) (08/15 0815) Pulse Rate:  [80-129] 100 (08/15 0730) Resp:  [16-46] 23 (08/15 0730) BP: (79-137)/(63-97) 101/73 (08/15 0730) SpO2:  [68 %-100 %] 98 % (08/15 0730) Arterial Line BP: (64-143)/(49-101) 97/64 (08/15 0730) FiO2 (%):  [40 %-50 %] 40 % (08/14 2020) Weight:  [94.1 kg] 94.1 kg (08/15 0600)  Filed Weights   05/16/23 0723 05/17/23 0600  Weight: 89.8 kg 94.1 kg    Weight change:    Hemodynamic parameters for last 24 hours: CVP:  [9 mmHg-10 mmHg] 10 mmHg CO:  [4.4 L/min-10.9 L/min] 4.4 L/min CI:  [2.1 L/min/m2-5.3 L/min/m2] 2.1 L/min/m2  Intake/Output from previous day: 08/14 0701 - 08/15 0700 In: 5329.8 [I.V.:2815; Blood:555; IV Piggyback:1959.8] Out: 3064 [Urine:1453; Blood:951; Chest Tube:660]  Intake/Output this shift: No intake/output data recorded.  Current Meds: Scheduled Meds:  acetaminophen  1,000 mg Oral Q6H   Or   acetaminophen (TYLENOL) oral liquid 160 mg/5 mL  1,000 mg Per Tube Q6H   aspirin EC  325 mg Oral Daily   Or   aspirin  324 mg Per Tube Daily   atorvastatin  80 mg Oral q AM   bisacodyl  10 mg Oral Daily   Or   bisacodyl  10 mg Rectal Daily   Chlorhexidine Gluconate Cloth  6 each Topical Daily   docusate sodium  200 mg Oral Daily   ezetimibe  10 mg Oral QHS   insulin aspart  0-24 Units Subcutaneous Q4H   metoCLOPramide (REGLAN) injection  10 mg Intravenous Q6H   metoprolol tartrate  12.5 mg Oral BID   Or    metoprolol tartrate  12.5 mg Per Tube BID   [START ON 05/18/2023] pantoprazole  40 mg Oral Daily   pantoprazole (PROTONIX) IV  40 mg Intravenous QHS   sodium chloride flush  3 mL Intravenous Q12H   Continuous Infusions:  sodium chloride     sodium chloride     sodium chloride     dexmedetomidine (PRECEDEX) IV infusion Stopped (05/17/23 0428)   lactated ringers     lactated ringers Stopped (05/17/23 0544)   levofloxacin (LEVAQUIN) IV     niCARDipine     phenylephrine (NEO-SYNEPHRINE) Adult infusion Stopped (05/17/23 0544)   PRN Meds:.sodium chloride, ALPRAZolam, ketotifen, metoprolol tartrate, midazolam, morphine injection, ondansetron (ZOFRAN) IV, mouth rinse, oxyCODONE, sodium chloride flush, traMADol  General appearance: alert, cooperative, and no distress Heart: regular rate and rhythm Lungs: clear anteriorly Abdomen: benign Extremities: min edema Wound: incis dressings CDI  Lab Results: CBC: Recent Labs    05/16/23 1930 05/16/23 2043 05/17/23 0154 05/17/23 0351  WBC 8.8  --   --  15.2*  HGB 11.2*   < > 10.2* 11.5*  HCT 32.9*   < > 30.0* 34.2*  PLT 168  --   --  185   < > =  values in this interval not displayed.   BMET:  Recent Labs    05/16/23 1930 05/16/23 2043 05/17/23 0154 05/17/23 0351  NA 137   < > 141 136  K 4.3   < > 4.0 4.2  CL 109  --   --  108  CO2 22  --   --  23  GLUCOSE 124*  --   --  112*  BUN 11  --   --  11  CREATININE 0.91  --   --  1.16  CALCIUM 7.5*  --   --  8.1*   < > = values in this interval not displayed.    CMET: Lab Results  Component Value Date   WBC 15.2 (H) 05/17/2023   HGB 11.5 (L) 05/17/2023   HCT 34.2 (L) 05/17/2023   PLT 185 05/17/2023   GLUCOSE 112 (H) 05/17/2023   LDLDIRECT 99 04/03/2023   ALT 19 05/14/2023   AST 22 05/14/2023   NA 136 05/17/2023   K 4.2 05/17/2023   CL 108 05/17/2023   CREATININE 1.16 05/17/2023   BUN 11 05/17/2023   CO2 23 05/17/2023   INR 1.5 (H) 05/16/2023   HGBA1C 5.4 05/14/2023       PT/INR:  Recent Labs    05/16/23 1352  LABPROT 18.3*  INR 1.5*   Radiology: [facility] Chest Port 1 View  Result Date: 05/16/2023 CLINICAL DATA:  Postoperative CABG EXAM: PORTABLE CHEST 1 VIEW COMPARISON:  05/14/2023 FINDINGS: Interval postoperative changes to the chest following sternotomy and CABG. Endotracheal tube terminates approximately 5.8 cm above the carina. Enteric tube extends into the stomach. Right IJ central venous catheter terminates at the level of the proximal SVC. Mediastinal drain and right basilar chest tube in place. Heart size is normal. Left greater than right bibasilar atelectasis. No pleural effusion. Possible trace left apical pneumothorax. No right-sided pneumothorax. IMPRESSION: 1. Interval postoperative changes to the chest following sternotomy and CABG. 2. Possible trace left apical pneumothorax.  Attention on follow-up. 3. Left greater than right bibasilar atelectasis. Electronically Signed   By: Duanne Guess D.O.   On: 05/16/2023 15:04   EP STUDY  Result Date: 05/16/2023 See surgical note for result.    Assessment/Plan: S/P Procedure(s) (LRB): CORONARY ARTERY BYPASS GRAFTING (CABG) X3 USING LEFT INTERNAL MAMMARY ARTERY (LIMA) AND ENDOSCOPICALLY HARVESTED RIGHT GREATER SAPHENOUS VEIN (N/A) TRANSESOPHAGEAL ECHOCARDIOGRAM (N/A) POD#1  1 Tmax 100.9, good hemodynamics, no gtts, SR/Stachy 2 sats good on 4 liters 3 good UOP, weight up 5 kg, will need some diuretic for expected Volume overload 4 CT 660 ml- leave for now 5 BS well controlled, not a diabetic 6 normal renal fxn, K+ Mg++ ok 7 reactive leukocytosis- monitor,  fever curve as well 8 expected ABLA- minor, monitor 9 CXR some vasc congestion 10 routine pulm hygiena and cardiac rehab    Rowe Clack PA-C Pager [phone] 05/17/2023 8:31 AM  Agree with above POD progression Pain control  Dayani Winbush O Brayant Dorr

## 2023-05-17 NOTE — Plan of Care (Signed)

## 2023-05-18 ENCOUNTER — Inpatient Hospital Stay (HOSPITAL_COMMUNITY): Payer: BC Managed Care – PPO

## 2023-05-18 LAB — BASIC METABOLIC PANEL
Anion gap: 7 (ref 5–15)
BUN: 11 mg/dL (ref 6–20)
CO2: 25 mmol/L (ref 22–32)
Calcium: 8.2 mg/dL — ABNORMAL LOW (ref 8.9–10.3)
Chloride: 98 mmol/L (ref 98–111)
Creatinine, Ser: 1.23 mg/dL (ref 0.61–1.24)
GFR, Estimated: >60 mL/min (ref >60)
Glucose, Bld: 103 mg/dL — ABNORMAL HIGH (ref 70–99)
Potassium: 4.4 mmol/L (ref 3.5–5.1)
Sodium: 130 mmol/L — ABNORMAL LOW (ref 135–145)

## 2023-05-18 LAB — GLUCOSE, CAPILLARY
Glucose-Capillary: 100 mg/dL — ABNORMAL HIGH (ref 70–99)
Glucose-Capillary: 103 mg/dL — ABNORMAL HIGH (ref 70–99)
Glucose-Capillary: 104 mg/dL — ABNORMAL HIGH (ref 70–99)
Glucose-Capillary: 112 mg/dL — ABNORMAL HIGH (ref 70–99)
Glucose-Capillary: 95 mg/dL (ref 70–99)
Glucose-Capillary: 99 mg/dL (ref 70–99)

## 2023-05-18 LAB — CBC
HCT: 32.6 % — ABNORMAL LOW (ref 39.0–52.0)
Hemoglobin: 10.7 g/dL — ABNORMAL LOW (ref 13.0–17.0)
MCH: 30 pg (ref 26.0–34.0)
MCHC: 32.8 g/dL (ref 30.0–36.0)
MCV: 91.3 fL (ref 80.0–100.0)
Platelets: 170 10*3/uL (ref 150–400)
RBC: 3.57 MIL/uL — ABNORMAL LOW (ref 4.22–5.81)
RDW: 12.8 % (ref 11.5–15.5)
WBC: 12.4 10*3/uL — ABNORMAL HIGH (ref 4.0–10.5)
nRBC: 0 % (ref 0.0–0.2)

## 2023-05-18 MED ORDER — ORAL CARE MOUTH RINSE: 15.0000 mL | OROMUCOSAL | Status: DC | PRN

## 2023-05-18 MED ORDER — ~~LOC~~ CARDIAC SURGERY, PATIENT & FAMILY EDUCATION: Freq: Once | Status: AC

## 2023-05-18 MED ORDER — SODIUM CHLORIDE 0.9% FLUSH
3.0000 mL | Freq: Two times a day (BID) | INTRAVENOUS | Status: DC
  Administered 2023-05-18 (×2): 3 mL via INTRAVENOUS

## 2023-05-18 MED ORDER — SODIUM CHLORIDE 0.9 % IV SOLN: 250.0000 mL | INTRAVENOUS | Status: DC | PRN

## 2023-05-18 MED ORDER — SODIUM CHLORIDE 0.9% FLUSH
3.0000 mL | INTRAVENOUS | Status: DC | PRN
  Administered 2023-05-18: 3 mL via INTRAVENOUS

## 2023-05-18 MED ORDER — FUROSEMIDE 40 MG PO TABS
40.0000 mg | ORAL_TABLET | Freq: Every day | ORAL | Status: DC
  Administered 2023-05-18 – 2023-05-21 (×4): 40 mg via ORAL
  Filled 2023-05-18 (×4): qty 1

## 2023-05-18 NOTE — Progress Notes (Signed)
PCCM:  Patient being transferred to the floor.   Josephine Igo, DO  Pulmonary Critical Care 05/18/2023 9:22 AM

## 2023-05-18 NOTE — Plan of Care (Signed)

## 2023-05-18 NOTE — Progress Notes (Signed)
      [address]       [place] [address]             [phone]                 2 Days Post-Op Procedure(s) (LRB): CORONARY ARTERY BYPASS GRAFTING (CABG) X3 USING LEFT INTERNAL MAMMARY ARTERY (LIMA) AND ENDOSCOPICALLY HARVESTED RIGHT GREATER SAPHENOUS VEIN (N/A) TRANSESOPHAGEAL ECHOCARDIOGRAM (N/A)   Events: No events Pain better controlled _______________________________________________________________ Vitals: BP 117/83   Pulse (!) 110   Temp 98.4 F (36.9 C) (Oral)   Resp 14   Ht 5\' 10"  (1.778 m)   Wt 96.4 kg   SpO2 96%   BMI 30.49 kg/m  Filed Weights   05/16/23 0723 05/17/23 0600 05/18/23 0600  Weight: 89.8 kg 94.1 kg 96.4 kg     - Neuro: alert NAD  - Cardiovascular: sinus  Drips: none.   CVP:  [4 mmHg-11 mmHg] 8 mmHg CO:  [4 L/min-6.4 L/min] 4.9 L/min CI:  [1.9 L/min/m2-3.1 L/min/m2] 2.3 L/min/m2  - Pulm: EWOB    ABG    Component Value Date/Time   PHART 7.287 (L) 05/17/2023 0154   PCO2ART 45.2 05/17/2023 0154   PO2ART 116 (H) 05/17/2023 0154   HCO3 21.5 05/17/2023 0154   TCO2 23 05/17/2023 0154   ACIDBASEDEF 5.0 (H) 05/17/2023 0154   O2SAT 98 05/17/2023 0154    - Abd: ND - Extremity: warm  .Intake/Output      08/15 0701 08/16 0700 08/16 0701 08/17 0700   P.Scott. 240    I.V. (mL/kg)     Blood     IV Piggyback 149.9    Total Intake(mL/kg) 389.9 (4)    Urine (mL/kg/hr) 1395 (0.6)    Blood     Chest Tube 380    Total Output 1775    Net -1385.1            _______________________________________________________________ Labs:    Latest Ref Rng & Units 05/18/2023    4:36 AM 05/17/2023    5:48 PM 05/17/2023    3:51 AM  CBC  WBC 4.0 - 10.5 K/uL 12.4  16.6  15.2   Hemoglobin 13.0 - 17.0 g/dL 46.9  62.9  52.8   Hematocrit 39.0 - 52.0 % 32.6  34.9  34.2   Platelets 150 - 400 K/uL 170  198  185       Latest Ref Rng & Units 05/18/2023    4:36 AM 05/17/2023    5:48 PM 05/17/2023    3:51 AM  CMP  Glucose 70 - 99 mg/dL 413   244  010   BUN 6 - 20 mg/dL 11  14  11    Creatinine 0.61 - 1.24 mg/dL 2.72  5.36  6.44   Sodium 135 - 145 mmol/L 130  132  136   Potassium 3.5 - 5.1 mmol/L 4.4  4.3  4.2   Chloride 98 - 111 mmol/L 98  100  108   CO2 22 - 32 mmol/L 25  22  23    Calcium 8.9 - 10.3 mg/dL 8.2  8.2  8.1     CXR: Small L effuson  _______________________________________________________________  Assessment and Plan: POD 2 s/p CABG  Neuro: pain controlled CV: on A/S/BB Pulm: IS, ambulation Renal: stable, will diurese today GI: on diet Heme: stable ID: [email or web address] Endo: SSI Dispo: floor   Scott Mcguire Scott Mcguire 05/18/2023 8:46 AM

## 2023-05-19 LAB — BASIC METABOLIC PANEL
Anion gap: 7 (ref 5–15)
BUN: 7 mg/dL (ref 6–20)
CO2: 28 mmol/L (ref 22–32)
Calcium: 8.3 mg/dL — ABNORMAL LOW (ref 8.9–10.3)
Chloride: 97 mmol/L — ABNORMAL LOW (ref 98–111)
Creatinine, Ser: 1.07 mg/dL (ref 0.61–1.24)
GFR, Estimated: >60 mL/min (ref >60)
Glucose, Bld: 97 mg/dL (ref 70–99)
Potassium: 4.2 mmol/L (ref 3.5–5.1)
Sodium: 132 mmol/L — ABNORMAL LOW (ref 135–145)

## 2023-05-19 LAB — CBC
HCT: 31.5 % — ABNORMAL LOW (ref 39.0–52.0)
Hemoglobin: 10.8 g/dL — ABNORMAL LOW (ref 13.0–17.0)
MCH: 31.5 pg (ref 26.0–34.0)
MCHC: 34.3 g/dL (ref 30.0–36.0)
MCV: 91.8 fL (ref 80.0–100.0)
Platelets: 173 10*3/uL (ref 150–400)
RBC: 3.43 MIL/uL — ABNORMAL LOW (ref 4.22–5.81)
RDW: 12.7 % (ref 11.5–15.5)
WBC: 9.3 10*3/uL (ref 4.0–10.5)
nRBC: 0 % (ref 0.0–0.2)

## 2023-05-19 LAB — GLUCOSE, CAPILLARY
Glucose-Capillary: 112 mg/dL — ABNORMAL HIGH (ref 70–99)
Glucose-Capillary: 100 mg/dL — ABNORMAL HIGH (ref 70–99)

## 2023-05-19 MED ORDER — INSULIN ASPART 100 UNIT/ML IJ SOLN: 0.0000 [IU] | Freq: Every day | INTRAMUSCULAR | Status: DC

## 2023-05-19 MED ORDER — METOPROLOL TARTRATE 25 MG PO TABS
25.0000 mg | ORAL_TABLET | Freq: Two times a day (BID) | ORAL | Status: DC
  Administered 2023-05-19 – 2023-05-21 (×5): 25 mg via ORAL
  Filled 2023-05-19 (×5): qty 1

## 2023-05-19 MED ORDER — ALBUTEROL SULFATE (2.5 MG/3ML) 0.083% IN NEBU: 2.5000 mg | INHALATION_SOLUTION | RESPIRATORY_TRACT | Status: DC

## 2023-05-19 MED ORDER — INSULIN ASPART 100 UNIT/ML IJ SOLN: 0.0000 [IU] | Freq: Three times a day (TID) | INTRAMUSCULAR | Status: DC

## 2023-05-19 MED ORDER — GUAIFENESIN ER 600 MG PO TB12
600.0000 mg | ORAL_TABLET | Freq: Two times a day (BID) | ORAL | Status: DC
  Administered 2023-05-19 – 2023-05-21 (×5): 600 mg via ORAL
  Filled 2023-05-19 (×5): qty 1

## 2023-05-19 MED ORDER — ALBUTEROL SULFATE (2.5 MG/3ML) 0.083% IN NEBU: 2.5000 mg | INHALATION_SOLUTION | RESPIRATORY_TRACT | Status: DC | PRN

## 2023-05-19 MED ORDER — ALBUTEROL SULFATE (2.5 MG/3ML) 0.083% IN NEBU
INHALATION_SOLUTION | RESPIRATORY_TRACT | Status: AC
  Filled 2023-05-19: qty 3

## 2023-05-19 MED ORDER — METOPROLOL TARTRATE 25 MG/10 ML ORAL SUSPENSION
25.0000 mg | Freq: Two times a day (BID) | ORAL | Status: DC
  Filled 2023-05-19 (×4): qty 10

## 2023-05-19 NOTE — Plan of Care (Signed)
  Problem: Education: Goal: Knowledge of General Education information will improve Description: Including pain rating scale, medication(s)/side effects and non-pharmacologic comfort measures Outcome: Progressing   Problem: Health Behavior/Discharge Planning: Goal: Ability to manage health-related needs will improve Outcome: Progressing   Problem: Clinical Measurements: Goal: Ability to maintain clinical measurements within normal limits will improve Outcome: Progressing Goal: Will remain free from infection Outcome: Progressing Goal: Diagnostic test results will improve Outcome: Progressing Goal: Respiratory complications will improve Outcome: Progressing Goal: Cardiovascular complication will be avoided Outcome: Progressing   Problem: Activity: Goal: Risk for activity intolerance will decrease Outcome: Progressing   Problem: Nutrition: Goal: Adequate nutrition will be maintained Outcome: Progressing   Problem: Coping: Goal: Level of anxiety will decrease Outcome: Progressing   Problem: Elimination: Goal: Will not experience complications related to bowel motility Outcome: Progressing Goal: Will not experience complications related to urinary retention Outcome: Progressing   Problem: Pain Managment: Goal: General experience of comfort will improve Outcome: Progressing   Problem: Safety: Goal: Ability to remain free from injury will improve Outcome: Progressing   Problem: Skin Integrity: Goal: Risk for impaired skin integrity will decrease Outcome: Progressing   Problem: Education: Goal: Will demonstrate proper wound care and an understanding of methods to prevent future damage Outcome: Progressing Goal: Knowledge of disease or condition will improve Outcome: Progressing Goal: Knowledge of the prescribed therapeutic regimen will improve Outcome: Progressing Goal: Individualized Educational Video(s) Outcome: Progressing   Problem: Activity: Goal: Risk for  activity intolerance will decrease Outcome: Progressing   Problem: Cardiac: Goal: Will achieve and/or maintain hemodynamic stability Outcome: Progressing   Problem: Clinical Measurements: Goal: Postoperative complications will be avoided or minimized Outcome: Progressing   Problem: Respiratory: Goal: Respiratory status will improve Outcome: Progressing   Problem: Skin Integrity: Goal: Wound healing without signs and symptoms of infection Outcome: Progressing Goal: Risk for impaired skin integrity will decrease Outcome: Progressing   Problem: Urinary Elimination: Goal: Ability to achieve and maintain adequate renal perfusion and functioning will improve Outcome: Progressing   Problem: Education: Goal: Ability to describe self-care measures that may prevent or decrease complications (Diabetes Survival Skills Education) will improve Outcome: Progressing Goal: Individualized Educational Video(s) Outcome: Progressing   Problem: Coping: Goal: Ability to adjust to condition or change in health will improve Outcome: Progressing   Problem: Fluid Volume: Goal: Ability to maintain a balanced intake and output will improve Outcome: Progressing   Problem: Health Behavior/Discharge Planning: Goal: Ability to identify and utilize available resources and services will improve Outcome: Progressing Goal: Ability to manage health-related needs will improve Outcome: Progressing   Problem: Metabolic: Goal: Ability to maintain appropriate glucose levels will improve Outcome: Progressing   Problem: Nutritional: Goal: Maintenance of adequate nutrition will improve Outcome: Progressing Goal: Progress toward achieving an optimal weight will improve Outcome: Progressing   Problem: Skin Integrity: Goal: Risk for impaired skin integrity will decrease Outcome: Progressing   Problem: Tissue Perfusion: Goal: Adequacy of tissue perfusion will improve Outcome: Progressing   

## 2023-05-19 NOTE — Progress Notes (Addendum)
      [address]       [place] [address]             [phone]      3 Days Post-Op Procedure(s) (LRB): CORONARY ARTERY BYPASS GRAFTING (CABG) X3 USING LEFT INTERNAL MAMMARY ARTERY (LIMA) AND ENDOSCOPICALLY HARVESTED RIGHT GREATER SAPHENOUS VEIN (N/A) TRANSESOPHAGEAL ECHOCARDIOGRAM (N/A)  Subjective:  Patient doing well.  States he is surprised how well he has done post surgery.  Pain is well controlled.  Denies N/V.  Starting to pass gas.  Objective: Vital signs in last 24 hours: Temp:  [97.8 F (36.6 C)-100 F (37.8 C)] 97.9 F (36.6 C) (08/17 0826) Pulse Rate:  [94-118] 103 (08/17 0600) Cardiac Rhythm: Sinus tachycardia (08/16 2000) Resp:  [10-19] 13 (08/17 0600) BP: (111-142)/(75-101) 117/88 (08/17 0600) SpO2:  [92 %-99 %] 95 % (08/17 0600) Weight:  [98.8 kg] 98.8 kg (08/17 0542)  Intake/Output from previous day: 08/16 0701 - 08/17 0700 In: 144 [P.O.:120; I.V.:24] Out: 2975 [Urine:2975]  General appearance: alert, cooperative, and no distress Heart: regular rate and rhythm Lungs: clear to auscultation bilaterally Abdomen: soft, non-tender; bowel sounds normal; no masses,  no organomegaly Extremities: edema trace Wound: clean and dry  Lab Results: Recent Labs    05/18/23 0436 05/19/23 0208  WBC 12.4* 9.3  HGB 10.7* 10.8*  HCT 32.6* 31.5*  PLT 170 173   BMET:  Recent Labs    05/18/23 0436 05/19/23 0208  NA 130* 132*  K 4.4 4.2  CL 98 97*  CO2 25 28  GLUCOSE 103* 97  BUN 11 7  CREATININE 1.23 1.07  CALCIUM 8.2* 8.3*    PT/INR:  Recent Labs    05/16/23 1352  LABPROT 18.3*  INR 1.5*   ABG    Component Value Date/Time   PHART 7.287 (L) 05/17/2023 0154   HCO3 21.5 05/17/2023 0154   TCO2 23 05/17/2023 0154   ACIDBASEDEF 5.0 (H) 05/17/2023 0154   O2SAT 98 05/17/2023 0154   CBG (last 3)  Recent Labs    05/18/23 2021 05/18/23 2351 05/19/23 0824  GLUCAP 100* 104* 112*    Assessment/Plan: S/P Procedure(s)  (LRB): CORONARY ARTERY BYPASS GRAFTING (CABG) X3 USING LEFT INTERNAL MAMMARY ARTERY (LIMA) AND ENDOSCOPICALLY HARVESTED RIGHT GREATER SAPHENOUS VEIN (N/A) TRANSESOPHAGEAL ECHOCARDIOGRAM (N/A)  CV- Sinus Tach, BP elevated at times- will increase Lopressor to 25 mg BID Pulm- wean oxygen as tolerated, CT are out.. does have some mild congestion will add Mucinex Renal- creatinine stable, weight is elevated, continue Lasix, potassium Expected post operative blood loss anemia, Hgb stable at 10.8 CBGs controlled, not a diabetic, will d/c SSIP  DIspo- doing well, awaiting bed on 4E, titration of BB   LOS: 3 days    Lowella Dandy, PA-C 05/19/2023  Agree with above Awaiting floor bed  Myeisha Kruser O Sofia Vanmeter

## 2023-05-19 NOTE — Progress Notes (Signed)
CARDIAC REHAB PHASE I   PRE:  Rate/Rhythm: 102 ST    BP: sitting 123/86    SpO2: 98 2 1/2L  MODE:  Ambulation: 270 ft   POST:  Rate/Rhythm: 130 ST    BP: sitting 127/83     SpO2: 91 RA  Pt moved out of bed with head elevated independently. Stood and ambulated with RW, standby assist. Slow pace with increased HR. SpO2 maintained 91 RA therefore left O2 off after walk. To recliner. Overall tolerated walk well, wife very supportive and is [employer] fundraiser.   Discussed with pt and wife IS, sternal precautions, diet, exercise, and CRPII. Will refer to [facility]. [phone]   Ethelda Chick BS, ACSM-CEP 05/19/2023 11:22 AM

## 2023-05-20 ENCOUNTER — Inpatient Hospital Stay (HOSPITAL_COMMUNITY): Payer: BC Managed Care – PPO

## 2023-05-20 MED ORDER — DILTIAZEM HCL ER COATED BEADS 120 MG PO CP24
240.0000 mg | ORAL_CAPSULE | Freq: Every day | ORAL | Status: DC
  Administered 2023-05-20 – 2023-05-21 (×2): 240 mg via ORAL
  Filled 2023-05-20 (×2): qty 2

## 2023-05-20 NOTE — Plan of Care (Signed)
POC initiated and progressing. 

## 2023-05-20 NOTE — TOC Initial Note (Signed)
Transition of Care [facility]) - Initial/Assessment Note    Patient Details  Name: Scott Mcguire MRN: [id number] Date of Birth: 03-29-1971  Transition of Care [facility]) CM/SW Contact:    Ronny Bacon, RN Phone Number: 05/20/2023, 4:20 PM  Clinical Narrative:   Order noted for RW, spoke with patient and family, agree to need for RW. RW to be delivered to bedside by [employer].                      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   Dyspnea on exertion 10/29/2020   Essential hypertension 10/29/2020   Mixed hyperlipidemia 10/29/2020   Tachycardia    Anxiety    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 [place] [address] Phone: [phone] Fax: [phone]     Social Determinants of Health (SDOH) Social History: SDOH Screenings   Tobacco Use: Low Risk  (05/16/2023)   SDOH Interventions:     Readmission Risk Interventions     No data to display

## 2023-05-20 NOTE — Progress Notes (Addendum)
      [address]       [place] [place] [address]             [phone]      4 Days Post-Op Procedure(s) (LRB): CORONARY ARTERY BYPASS GRAFTING (CABG) X3 USING LEFT INTERNAL MAMMARY ARTERY (LIMA) AND ENDOSCOPICALLY HARVESTED RIGHT GREATER SAPHENOUS VEIN (N/A) TRANSESOPHAGEAL ECHOCARDIOGRAM (N/A)  Subjective:  Patient doing well.  Has not been able to move his bowels yet.  He is passing gas.  Objective: Vital signs in last 24 hours: Temp:  [97.9 F (36.6 C)-98.5 F (36.9 C)] 98.4 F (36.9 C) (08/18 0711) Pulse Rate:  [91-113] 103 (08/18 0711) Cardiac Rhythm: Sinus tachycardia (08/17 1900) Resp:  [10-27] 20 (08/18 0711) BP: (114-134)/(80-101) 125/90 (08/18 0711) SpO2:  [91 %-98 %] 95 % (08/18 0711) Weight:  [91.5 kg] 91.5 kg (08/18 0654)  Intake/Output from previous day: No intake/output data recorded. Intake/Output this shift: No intake/output data recorded.  General appearance: alert, cooperative, and no distress Heart: regular rate and rhythm and tachy Lungs: diminished breath sounds bibasilar Abdomen: soft, non-tender; bowel sounds normal; no masses,  no organomegaly Extremities: extremities normal, atraumatic, no cyanosis or edema Wound: clean and dry  Lab Results: Recent Labs    05/18/23 0436 05/19/23 0208  WBC 12.4* 9.3  HGB 10.7* 10.8*  HCT 32.6* 31.5*  PLT 170 173   BMET:  Recent Labs    05/18/23 0436 05/19/23 0208  NA 130* 132*  K 4.4 4.2  CL 98 97*  CO2 25 28  GLUCOSE 103* 97  BUN 11 7  CREATININE 1.23 1.07  CALCIUM 8.2* 8.3*    PT/INR: No results for input(s): "LABPROT", "INR" in the last 72 hours. ABG    Component Value Date/Time   PHART 7.287 (L) 05/17/2023 0154   HCO3 21.5 05/17/2023 0154   TCO2 23 05/17/2023 0154   ACIDBASEDEF 5.0 (H) 05/17/2023 0154   O2SAT 98 05/17/2023 0154   CBG (last 3)  Recent Labs    05/18/23 2351 05/19/23 0824 05/19/23 1200  GLUCAP 104* 112* 100*    Assessment/Plan: S/P  Procedure(s) (LRB): CORONARY ARTERY BYPASS GRAFTING (CABG) X3 USING LEFT INTERNAL MAMMARY ARTERY (LIMA) AND ENDOSCOPICALLY HARVESTED RIGHT GREATER SAPHENOUS VEIN (N/A) TRANSESOPHAGEAL ECHOCARDIOGRAM (N/A)  CV- patient has history of Atrial Tachycardia.. continue Lopressor, will resume home Cardizem Pulm- off oxygen, atelectasis.Marland Kitchen use IS Renal- creatinine has been stable, weight is trending down... continue Lasix, potassium GI- constipation, mild continue LOC stool softners, will add prn Lactulose Dipso- patient stable, will resume home Cardizem, continue Lopressor.Marland Kitchen if remains stable.. can plan to d/c home in AM   LOS: 4 days    Lowella Dandy, PA-C 05/20/2023  Agree with above Titrating meds Home soone  Yakira Duquette O Walda Hertzog

## 2023-05-20 NOTE — Plan of Care (Signed)
  Problem: Education: Goal: Knowledge of General Education information will improve Description: Including pain rating scale, medication(s)/side effects and non-pharmacologic comfort measures Outcome: Progressing   Problem: Health Behavior/Discharge Planning: Goal: Ability to manage health-related needs will improve Outcome: Progressing   Problem: Clinical Measurements: Goal: Ability to maintain clinical measurements within normal limits will improve Outcome: Progressing Goal: Will remain free from infection Outcome: Progressing Goal: Diagnostic test results will improve Outcome: Progressing Goal: Respiratory complications will improve Outcome: Progressing Goal: Cardiovascular complication will be avoided Outcome: Progressing   Problem: Activity: Goal: Risk for activity intolerance will decrease Outcome: Progressing   Problem: Nutrition: Goal: Adequate nutrition will be maintained Outcome: Progressing   Problem: Coping: Goal: Level of anxiety will decrease Outcome: Progressing   Problem: Elimination: Goal: Will not experience complications related to bowel motility Outcome: Progressing Goal: Will not experience complications related to urinary retention Outcome: Progressing   Problem: Pain Managment: Goal: General experience of comfort will improve Outcome: Progressing   Problem: Safety: Goal: Ability to remain free from injury will improve Outcome: Progressing   Problem: Skin Integrity: Goal: Risk for impaired skin integrity will decrease Outcome: Progressing   Problem: Education: Goal: Will demonstrate proper wound care and an understanding of methods to prevent future damage Outcome: Progressing Goal: Knowledge of disease or condition will improve Outcome: Progressing Goal: Knowledge of the prescribed therapeutic regimen will improve Outcome: Progressing Goal: Individualized Educational Video(s) Outcome: Progressing   Problem: Activity: Goal: Risk for  activity intolerance will decrease Outcome: Progressing   Problem: Cardiac: Goal: Will achieve and/or maintain hemodynamic stability Outcome: Progressing   Problem: Clinical Measurements: Goal: Postoperative complications will be avoided or minimized Outcome: Progressing   Problem: Respiratory: Goal: Respiratory status will improve Outcome: Progressing   Problem: Skin Integrity: Goal: Wound healing without signs and symptoms of infection Outcome: Progressing Goal: Risk for impaired skin integrity will decrease Outcome: Progressing   Problem: Urinary Elimination: Goal: Ability to achieve and maintain adequate renal perfusion and functioning will improve Outcome: Progressing   Problem: Education: Goal: Ability to describe self-care measures that may prevent or decrease complications (Diabetes Survival Skills Education) will improve Outcome: Progressing Goal: Individualized Educational Video(s) Outcome: Progressing   Problem: Coping: Goal: Ability to adjust to condition or change in health will improve Outcome: Progressing   Problem: Fluid Volume: Goal: Ability to maintain a balanced intake and output will improve Outcome: Progressing   Problem: Health Behavior/Discharge Planning: Goal: Ability to identify and utilize available resources and services will improve Outcome: Progressing Goal: Ability to manage health-related needs will improve Outcome: Progressing   Problem: Metabolic: Goal: Ability to maintain appropriate glucose levels will improve Outcome: Progressing   Problem: Nutritional: Goal: Maintenance of adequate nutrition will improve Outcome: Progressing Goal: Progress toward achieving an optimal weight will improve Outcome: Progressing   Problem: Skin Integrity: Goal: Risk for impaired skin integrity will decrease Outcome: Progressing   Problem: Tissue Perfusion: Goal: Adequacy of tissue perfusion will improve Outcome: Progressing   

## 2023-05-21 MED ORDER — ASPIRIN 325 MG PO TBEC: 325.0000 mg | DELAYED_RELEASE_TABLET | Freq: Every day | ORAL | Status: AC

## 2023-05-21 MED ORDER — FUROSEMIDE 40 MG PO TABS: 40.0000 mg | ORAL_TABLET | Freq: Every day | ORAL | 0 refills | Status: DC

## 2023-05-21 MED ORDER — OXYCODONE HCL 5 MG PO TABS: 5.0000 mg | ORAL_TABLET | ORAL | 0 refills | Status: DC | PRN

## 2023-05-21 MED ORDER — METOPROLOL TARTRATE 25 MG PO TABS: 25.0000 mg | ORAL_TABLET | Freq: Two times a day (BID) | ORAL | 3 refills | Status: DC

## 2023-05-21 MED ORDER — GUAIFENESIN ER 600 MG PO TB12: 600.0000 mg | ORAL_TABLET | Freq: Two times a day (BID) | ORAL | Status: AC

## 2023-05-21 NOTE — Plan of Care (Signed)
Discharge instructions discussed with patient.  Patient instructed on home medications, restrictions, and follow up appointments. Belongings gathered and sent with patient.  Patients medications sent to requested pharmacy.

## 2023-05-21 NOTE — Plan of Care (Signed)
POC  Progressing.

## 2023-05-21 NOTE — Progress Notes (Signed)
      [address]       [place] [place] [address]             [phone]      5 Days Post-Op Procedure(s) (LRB): CORONARY ARTERY BYPASS GRAFTING (CABG) X3 USING LEFT INTERNAL MAMMARY ARTERY (LIMA) AND ENDOSCOPICALLY HARVESTED RIGHT GREATER SAPHENOUS VEIN (N/A) TRANSESOPHAGEAL ECHOCARDIOGRAM (N/A) Subjective: Feels he is progressing, no new concerns.  On RA, walked in the hall several times. BMs yesterday.  Objective: Vital signs in last 24 hours: Temp:  [98.3 F (36.8 C)-98.7 F (37.1 C)] 98.5 F (36.9 C) (08/19 0645) Pulse Rate:  [106-112] 110 (08/19 0645) Cardiac Rhythm: Sinus tachycardia (08/18 1921) Resp:  [18-20] 19 (08/19 0645) BP: (125-156)/(83-106) 156/106 (08/19 0645) SpO2:  [96 %-97 %] 96 % (08/19 0645) Weight:  [89.4 kg] 89.4 kg (08/19 0414)     Intake/Output from previous day: No intake/output data recorded. Intake/Output this shift: No intake/output data recorded.  General appearance: alert, cooperative, and no distress Neurologic: intact Heart: Sinus tach, 110-120. Lungs: clear to auscultation bilaterally Abdomen: soft, NT Extremities: no peripheral edema, the RLE EVH incision is intact and dry Wound: the sternotomy incision is intact and dry.  Lab Results: Recent Labs    05/19/23 0208  WBC 9.3  HGB 10.8*  HCT 31.5*  PLT 173   BMET:  Recent Labs    05/19/23 0208  NA 132*  K 4.2  CL 97*  CO2 28  GLUCOSE 97  BUN 7  CREATININE 1.07  CALCIUM 8.3*    PT/INR: No results for input(s): "LABPROT", "INR" in the last 72 hours. ABG    Component Value Date/Time   PHART 7.287 (L) 05/17/2023 0154   HCO3 21.5 05/17/2023 0154   TCO2 23 05/17/2023 0154   ACIDBASEDEF 5.0 (H) 05/17/2023 0154   O2SAT 98 05/17/2023 0154   CBG (last 3)  Recent Labs    05/18/23 2351 05/19/23 0824 05/19/23 1200  GLUCAP 104* 112* 100*    Assessment/Plan: S/P Procedure(s) (LRB): CORONARY ARTERY BYPASS GRAFTING (CABG) X3 USING LEFT INTERNAL MAMMARY  ARTERY (LIMA) AND ENDOSCOPICALLY HARVESTED RIGHT GREATER SAPHENOUS VEIN (N/A) TRANSESOPHAGEAL ECHOCARDIOGRAM (N/A)  -POD5 CABG x 3 after presenting with exertional angina and preserved LV function.  On asa, atorvastatin, metoprolol.  Diltiazem added yesterday for h/o atrial tachycardia. BP 130-150s.  Good progress with mobility.   -PULM- on RA with normal WOB, CXR yesterday showing no effusions, clear lung fields.   -RENAL- normal function. Wt near pre-op. No further diuresis needed.   -GI- tolerating PO's, BMs yesterday.   -ENDO- no h/o DM. CBGs well controlled.   -H/O anxiety- on his usual Xanax with adequate control.  -DVT PPX- on enoxaparin, ambulating.  -Disposition- Medically stable for discharge to home and he and his wife feel he is ready. Expect HR and BP will improve as diltiazem reaches steady state after being held perioperatively.  Instructions given and follow up arranged.     LOS: 5 days    Leary Roca, PA-C [phone] 05/21/2023

## 2023-05-22 ENCOUNTER — Telehealth (HOSPITAL_COMMUNITY): Payer: Self-pay

## 2023-05-22 NOTE — Telephone Encounter (Signed)
Per phase I cardiac rehab, fax referral to [facility].

## 2023-05-25 MED FILL — Calcium Chloride Inj 10%: INTRAVENOUS | Qty: 10 | Status: AC

## 2023-05-25 MED FILL — Mannitol IV Soln 20%: INTRAVENOUS | Qty: 500 | Status: AC

## 2023-05-25 MED FILL — Sodium Chloride Irrigation Soln 0.9%: Qty: 6000 | Status: AC

## 2023-05-25 MED FILL — Heparin Sodium (Porcine) Inj 1000 Unit/ML: INTRAMUSCULAR | Qty: 10 | Status: AC

## 2023-05-25 MED FILL — Electrolyte-R (PH 7.4) Solution: INTRAVENOUS | Qty: 4000 | Status: AC

## 2023-06-01 ENCOUNTER — Ambulatory Visit (INDEPENDENT_AMBULATORY_CARE_PROVIDER_SITE_OTHER): Payer: Self-pay | Admitting: Thoracic Surgery (Cardiothoracic Vascular Surgery)

## 2023-06-01 DIAGNOSIS — Z951 Presence of aortocoronary bypass graft: Secondary | ICD-10-CM

## 2023-06-01 NOTE — Progress Notes (Signed)
     [address]       [place] [address]             [phone]       Patient: Home Provider: Office Consent for Telemedicine visit obtained.  Today's visit was completed via a real-time telehealth (see specific modality noted below). The patient/authorized person provided oral consent at the time of the visit to engage in a telemedicine encounter with the present provider at [facility]. The patient/authorized person was informed of the potential benefits, limitations, and risks of telemedicine. The patient/authorized person expressed understanding that the laws that protect confidentiality also apply to telemedicine. The patient/authorized person acknowledged understanding that telemedicine does not provide emergency services and that he or she would need to call 911 or proceed to the nearest hospital for help if such a need arose.   Total time spent in the clinical discussion 10 minutes.  Telehealth Modality: Phone visit (audio only)  I had a telephone visit with  Scott Mcguire who is s/p CABG.  Overall doing well.  Pain is minimal.  Ambulating well. Vitals have been stable.  Scott Mcguire will see [place] back in 1 month with a chest x-ray for cardiac rehab clearance.  [name]

## 2023-06-10 NOTE — Progress Notes (Deleted)
Cardiology Office Note:    Date:  06/10/2023   ID:  Scott Mcguire, DOB Sep 27, 1971, MRN [id number]  PCP:  Scott Proffer, MD   [facility] Providers Cardiologist:  None     Referring MD: Scott Proffer, MD    History of Present Illness:    Scott Mcguire is a 52 y.o. male with a hx of coronary artery disease s/p CABG times 12/20/2022, hypertension, migraines, paroxysmal atrial tachycardia, chronic prostatitis, hyperlipidemia, anxiety.  Coronary CTA in 2022 the calcium score of 8.6, minimal CAD. 03/29/23 coronary CTA calcium score 43.8, severe stenosis of the left circumflex and moderate stenosis of the LAD, FFR suggested flow-limiting stenosis in the LAD and left circumflex 04/04/2023 left heart cath multivessel coronary artery disease, consultation with CVTS for CABG 04/04/2023 echo EF 60 to 65%, mild LV hypertrophy of the basal septal segment, trivial MR 05/14/2023 carotid ultrasound near normal bilaterally with only minimal wall thickening 05/16/2023 CABG x 3  He was admitted on 05/16/2023 and underwent three-vessel bypass LIMA to LAD, SVG to ramus, SVG to diagonal.  He had an uncomplicated hospitalization and was discharged on postop day 5.  He had a telemedicine visit with Dr. Cliffton Mcguire on 06/01/2023, was doing well at this time, plan to return in approximately 3 weeks for repeat chest x-ray and cardiac rehab clearance.  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 INVASIVE CV LAB;  Service: Cardiovascular;  Laterality: N/A;   NO PAST SURGERIES     TEE WITHOUT CARDIOVERSION N/A 05/16/2023   Procedure: TRANSESOPHAGEAL ECHOCARDIOGRAM;  Surgeon: Scott Skains, MD;  Location: [facility] OR;  Service: Open Heart Surgery;  Laterality: N/A;    Current Medications: No outpatient medications have been marked as taking for the 06/11/23 encounter (Appointment) with Scott Dibble, NP.     Allergies:   Ceftin [cefuroxime]   Social History   Socioeconomic History   Marital status: Married    Spouse name: Not on file   Number of children: Not on file   Years of education: Not on file   Highest education level: Not on file  Occupational History   Not on file  Tobacco Use   Smoking status: Never   Smokeless tobacco: Never  Substance and Sexual Activity   Alcohol use: Never   Drug use: Never   Sexual activity: Not on file  Other Topics Concern   Not on file  Social History Narrative   Not on file   Social Determinants of Health   Financial Resource Strain: Not on file  Food Insecurity: Not on file  Transportation Needs:  Not on file  Physical Activity: Not on file  Stress: Not on file  Social Connections: Not on file     Family History: The patient's family history includes Heart disease in his father and mother.  ROS:   Please see the history of present illness.     All other systems reviewed and are negative.  EKGs/Labs/Other Studies Reviewed:   Cardiac Studies & Procedures   CARDIAC CATHETERIZATION  CARDIAC CATHETERIZATION 04/04/2023  Narrative   Prox LAD to Mid LAD lesion is  70% stenosed.   Ost LAD to Prox LAD lesion is 50% stenosed.   Mid LAD lesion is 90% stenosed.   Ramus-1 lesion is 80% stenosed.   Ramus-2 lesion is 50% stenosed.   Ost Cx lesion is 40% stenosed.   Mid Cx lesion is 50% stenosed.   The left ventricular systolic function is normal.   LV end diastolic pressure is normal.   The left ventricular ejection fraction is 55-65% by visual estimate.   There is no aortic valve stenosis.   In the absence of any other complications or medical issues, we expect the patient to be ready for discharge from a cath perspective on 04/04/2023.  Multivessel coronary artery disease with the most severe disease being in the mid LAD and the ostial ramus branch.  There is another significant lesion in the proximal LAD.  The ostial to proximal LAD has diffuse, moderate disease.  There is moderate disease in the mid circumflex.  Cath works FFR was performed which showed that the circumflex disease was not significant. LAD FFR dropped to 0.72 in the mid vessel and 0.69 after all of the significant disease. The ramus FFR dropped to 0.83 after the initial lesion and to 0.77 in the mid vessel. Given the moderate LAD disease in the proximal vessel extends all the way back to the left main, landing the stent with strut hanging in the left main may further compromise the ramus vessel.  It would be a long area of stent.  Will obtain cardiac surgical consult for possible bypass. One option would be hybrid option with LIMA to LAD (would check with surgery if robotic option is available).  Consider medical therapy of the ramus and if symptoms persisted, could pursue PCI of the ostial ramus.  Since the LAD would be protected, this would be feasible.  There may be some shift into the circumflex vessel but this is a relatively small vessel.  He will need aggressive medical therapy including aspirin and statin.  Findings Coronary Findings Diagnostic  Dominance: Right  Left Anterior  Descending Ost LAD to Prox LAD lesion is 50% stenosed. Prox LAD to Mid LAD lesion is 70% stenosed. Mid LAD lesion is 90% stenosed.  Ramus Intermedius Vessel is large. Ramus-1 lesion is 80% stenosed. Ramus-2 lesion is 50% stenosed.  Left Circumflex Ost Cx lesion is 40% stenosed. Mid Cx lesion is 50% stenosed.  Right Coronary Artery There is mild diffuse disease throughout the vessel.  Intervention  No interventions have been documented.   STRESS TESTS  MYOCARDIAL PERFUSION IMAGING 06/15/2020   ECHOCARDIOGRAM  ECHOCARDIOGRAM COMPLETE 04/04/2023  Narrative ECHOCARDIOGRAM REPORT    Patient Name:   Scott Mcguire Raabe Date of Exam: 04/04/2023 Medical Rec #:  [id number]             Height:       70.0 in Accession #:    [id number]            Weight:  218.0 lb Date of Birth:  1971/09/10              BSA:          2.165 m Patient Age:    52 years              BP:           125/85 mmHg Patient Gender: M                     HR:           61 bpm. Exam Location:  Inpatient  Procedure: 2D Echo, Color Doppler and Cardiac Doppler  Indications:    Pre-Op  History:        Patient has no prior history of Echocardiogram examinations. CAD, Arrythmias:Tachycardia; Risk Factors:Hypertension.  Sonographer:    Darlys Gales Referring Phys: [id number] Perlie Gold  IMPRESSIONS   1. Left ventricular ejection fraction, by estimation, is 60 to 65%. The left ventricle has normal function. The left ventricle has no regional wall motion abnormalities. There is mild asymmetric left ventricular hypertrophy of the basal-septal segment. Left ventricular diastolic parameters are indeterminate. 2. Right ventricular systolic function is normal. The right ventricular size is normal. Tricuspid regurgitation signal is inadequate for assessing PA pressure. 3. The mitral valve is normal in structure. Trivial mitral valve regurgitation. 4. The aortic valve is tricuspid. Aortic valve regurgitation is not  visualized. No aortic stenosis is present.  FINDINGS Left Ventricle: Left ventricular ejection fraction, by estimation, is 60 to 65%. The left ventricle has normal function. The left ventricle has no regional wall motion abnormalities. The left ventricular internal cavity size was normal in size. There is mild asymmetric left ventricular hypertrophy of the basal-septal segment. Left ventricular diastolic parameters are indeterminate.  Right Ventricle: The right ventricular size is normal. No increase in right ventricular wall thickness. Right ventricular systolic function is normal. Tricuspid regurgitation signal is inadequate for assessing PA pressure.  Left Atrium: Left atrial size was normal in size.  Right Atrium: Right atrial size was normal in size.  Pericardium: There is no evidence of pericardial effusion.  Mitral Valve: The mitral valve is normal in structure. Trivial mitral valve regurgitation.  Tricuspid Valve: The tricuspid valve is normal in structure. Tricuspid valve regurgitation is trivial.  Aortic Valve: The aortic valve is tricuspid. Aortic valve regurgitation is not visualized. No aortic stenosis is present. Aortic valve peak gradient measures 4.7 mmHg.  Pulmonic Valve: The pulmonic valve was not well visualized. Pulmonic valve regurgitation is not visualized.  Aorta: The aortic root and ascending aorta are structurally normal, with no evidence of dilitation.  IAS/Shunts: The interatrial septum was not well visualized.   LEFT VENTRICLE PLAX 2D LVIDd:         5.30 cm LVIDs:         3.40 cm LV PW:         1.10 cm LV IVS:        1.20 cm LVOT diam:     2.00 cm LVOT Area:     3.14 cm   RIGHT VENTRICLE RV S prime:     14.80 cm/s TAPSE (M-mode): 3.1 cm  LEFT ATRIUM           Index LA Vol (A2C): 20.3 ml 9.38 ml/m LA Vol (A4C): 61.8 ml 28.54 ml/m AORTIC VALVE AV Area (Vmax): 2.48 cm AV Vmax:        108.00 cm/s AV Peak Grad:   4.7  mmHg LVOT Vmax:       85.40 cm/s  MITRAL VALVE MV Area (PHT): 3.50 cm     SHUNTS MV Decel Time: 217 msec     Systemic Diam: 2.00 cm MV E velocity: 79.70 cm/s MV A velocity: 104.00 cm/s MV E/A ratio:  0.77  [name] MD Electronically signed by [name] MD Signature Date/Time: 04/04/2023/1:48:20 PM    Final   TEE  ECHO INTRAOPERATIVE TEE 05/16/2023  Narrative *INTRAOPERATIVE TRANSESOPHAGEAL REPORT *    Patient Name:   Scott Mcguire Date of Exam: 05/16/2023 Medical Rec #:  [id number]             Height:       70.0 in Accession #:    [id number]            Weight:       198.0 lb Date of Birth:  02-19-1971              BSA:          2.08 m Patient Age:    52 years              BP:           153/110 mmHg Patient Gender: M                     HR:           72 bpm. Exam Location:  Anesthesiology  Transesophogeal exam was perform intraoperatively during surgical procedure. Patient was closely monitored under general anesthesia during the entirety of examination.  Indications:     CAD Native Vessel i25.10 Sonographer:     Irving Burton Senior RDCS Performing Phys: Leslye Peer MD Diagnosing Phys: Leslye Peer MD  Complications: No known complications during this procedure. POST-OP IMPRESSIONS _ Left Ventricle: The left ventricle is unchanged from pre-bypass. _ Right Ventricle: The right ventricle appears unchanged from pre-bypass. _ Aorta: The aorta appears unchanged from pre-bypass. _ Left Atrium: The left atrium appears unchanged from pre-bypass. _ Left Atrial Appendage: The left atrial appendage appears unchanged from pre-bypass. _ Aortic Valve: The aortic valve appears unchanged from pre-bypass. _ Mitral Valve: The mitral valve appears unchanged from pre-bypass. _ Tricuspid Valve: The tricuspid valve appears unchanged from pre-bypass. _ Pulmonic Valve: The pulmonic valve appears unchanged from pre-bypass. _ Interatrial Septum: The interatrial septum appears unchanged from  pre-bypass. _ Interventricular Septum: The interventricular septum appears unchanged from pre-bypass. _ Pericardium: The pericardium appears unchanged from pre-bypass.  PRE-OP FINDINGS Left Ventricle: The left ventricle has normal systolic function, with an ejection fraction of 55-60%. The cavity size was normal. There is mild concentric left ventricular hypertrophy of the basal-septal segment.   Right Ventricle: The right ventricle has normal systolic function. The cavity was normal. There is no increase in right ventricular wall thickness.  Left Atrium: Left atrial size was normal in size.  Right Atrium: Right atrial size was normal in size.  Interatrial Septum: No atrial level shunt detected by color flow Doppler.  Pericardium: There is no evidence of pericardial effusion.  Mitral Valve: The mitral valve is normal in structure. Mitral valve regurgitation is trivial by color flow Doppler. There is No evidence of mitral stenosis.  Tricuspid Valve: The tricuspid valve was normal in structure. Tricuspid valve regurgitation is trivial by color flow Doppler. No evidence of tricuspid stenosis is present.  Aortic Valve: The aortic valve is tricuspid Aortic valve regurgitation was not visualized by color flow Doppler.  There is no stenosis of the aortic valve.   Pulmonic Valve: The pulmonic valve was not assessed. Pulmonic valve regurgitation was not assessed by color flow Doppler.    Leslye Peer MD Electronically signed by Leslye Peer MD Signature Date/Time: 05/16/2023/1:36:47 PM    Final   MONITORS  LONG TERM MONITOR (3-14 DAYS) 07/07/2020  Narrative The patient wore the monitor for 3 days 1 hour starting 06/26/2020. Indication: Palpitations  The minimum heart rate was 52 bpm, maximum heart rate was 196 bpm, and average heart rate was 83 bpm. Predominant underlying rhythm was Sinus Rhythm.  2 Supraventricular Tachycardia runs occurred, the run with the fastest interval  lasting 5 beats with a maximum rate of 171 bpm (average 146 bpm); the run with the fastest interval was also the longest.  Premature atrial complexes were rare. Premature Ventricular complexes were occasional (1.0%, 3730).  No ventricular tachycardia, no pauses, No AV block and no atrial fibrillation present. 3 patient triggered events all associated with premature ventricular complex. 5 diary events 3 associated with premature ventricular complex and the remaining with sinus rhythm.  Conclusion: This study is remarkable for the following: 1. Rare asymptomatic atrial tachycardia with variable block. 2. Occasional premature ventricular complexes.   CT SCANS  CT CORONARY MORPH W/CTA COR W/SCORE 03/29/2023  Addendum 04/05/2023  1:44 PM ADDENDUM REPORT: 04/05/2023 13:41  EXAM: OVER-READ INTERPRETATION  CT CHEST  The following report is an over-read performed by radiologist Dr. Elnoria [facility] on 04/05/2023. This over-read does not include interpretation of cardiac or coronary anatomy or pathology. The cardiovascular interpretation by the cardiologist is attached.  COMPARISON:  11/09/2020  FINDINGS: No enlarged lymph nodes. The included lungs are clear with no nodules or pleural fluid seen. Thoracic spine degenerative changes. Unremarkable upper abdomen.  IMPRESSION: No significant extracardiac findings.   Electronically Signed By: Beckie Salts M.D. On: 04/05/2023 13:41  Narrative CLINICAL DATA:  52 yo male with chest pain  EXAM: Cardiac/Coronary CTA  TECHNIQUE: A non-contrast, gated CT scan was obtained with axial slices of 3 mm through the heart for calcium scoring. Calcium scoring was performed using the Agatston method. A 120 kV prospective, gated, contrast cardiac scan was obtained. Gantry rotation speed was 250 msecs and collimation was 0.6 mm. Two sublingual nitroglycerin tablets (0.8 mg) were given. The 3D data set was reconstructed in 5%  intervals of the 35-75% of the R-R cycle. Diastolic phases were analyzed on a dedicated workstation using MPR, MIP, and VRT modes. The patient received 95 cc of contrast.  FINDINGS: Image quality: Poor.  Noise artifact is: Moderate (misregistration; signal to noise).  Coronary Arteries:  Normal coronary origin.  Right dominance.  Left main: The left main is a large caliber vessel with a normal take off from the left coronary cusp that trifurcates into a LAD, LCX, and ramus intermedius. There is no plaque or stenosis.  Left anterior descending artery: The LAD has moderate (50-69) mixed plaque stenosis in the proximal vessel. The LAD gives off 2 small diagonal branches.  Ramus intermedius: Large, branching; moderate (50-69) stenosis in proximal, medial branch.  Left circumflex artery: The LCX is non-dominant and patent with severe (70-99) stenosis in the mid vessel. The LCX gives off 2 obtuse marginal branches not well visualized.  Right coronary artery: The RCA is dominant with normal take off from the right coronary cusp. There is no evidence of plaque or stenosis. The RCA terminates as a PDA and right posterolateral branch without evidence  of plaque or stenosis.  Right Atrium: Right atrial size is within normal limits.  Right Ventricle: The right ventricular cavity is within normal limits.  Left Atrium: Left atrial size is normal in size with no left atrial appendage filling defect.  Left Ventricle: The ventricular cavity size is within normal limits. There are no stigmata of prior infarction. There is no abnormal filling defect.  Pulmonary arteries: Normal in size without proximal filling defect.  Pulmonary veins: Normal pulmonary venous drainage.  Pericardium: Normal thickness with no significant effusion or calcium present.  Cardiac valves: The aortic valve is trileaflet without significant calcification. The mitral valve is normal structure  without significant calcification.  Aorta: Normal caliber with mild aortic atherosclerosis.  Extra-cardiac findings: See attached radiology report for non-cardiac structures.  IMPRESSION: 1. Coronary calcium score of 43.8. This was 71 percentile for age-, sex, and race-matched controls.  2. Normal coronary origin with right dominance.  3. Severe (70-99) stenosis in the LCX and moderate (50-69) stenoses in the LAD and branch of the ramus intermedius.  4. Mild aortic atherosclerosis.  5. Study will be sent for FFR.  RECOMMENDATIONS: CAD-RADS 4: Severe stenosis. (70-99% or > 50% left main). Cardiac catheterization or CT FFR is recommended. Consider symptom-guided anti-ischemic pharmacotherapy as well as risk factor modification per guideline directed care. Invasive coronary angiography recommended with revascularization per published guideline statements.  Olga Millers, MD  Electronically Signed: By: Olga Millers M.D. On: 03/29/2023 18:55   CT SCANS  CT CORONARY MORPH W/CTA COR W/SCORE 11/09/2020  Addendum 11/10/2020  4:04 PM ADDENDUM REPORT: 11/10/2020 16:02  CLINICAL DATA:  This is a 52 year old male with chest pain  EXAM: Cardiac/Coronary  CT  TECHNIQUE: The patient was scanned on a [employer].  FINDINGS: A 120 kV prospective scan was triggered in the descending thoracic aorta at 111 HU's. Axial non-contrast 3 mm slices were carried out through the heart. The data set was analyzed on a dedicated work station and scored using the Agatson method. Gantry rotation speed was 250 msecs and collimation was .6 mm. No beta blockade and 0.8 mg of sl NTG was given. The 3D data set was reconstructed in 5% intervals of the 67-82 % of the R-R cycle. Diastolic phases were analyzed on a dedicated work station using MPR, MIP and VRT modes. The patient received 80 cc of contrast.  Aorta: Normal size.  No calcifications.  No dissection.  Aortic Valve:   Trileaflet.  No calcifications.  Coronary calcium score 8.6.  Coronary Arteries:  Normal coronary origin.  Right dominance.  RCA is a large dominant artery that gives rise to PDA and PLVB. There is no plaque.  Left main is a large artery that gives rise to LAD and LCX arteries.  LAD is a large vessel. There is a minimal calcified plaque in the mid LAD. The proximal and distal LAD.  LCX is a non-dominant artery that gives rise to one large OM1 branch. There is no plaque. There is a stair-step artifact in the proximal LAD.  Other findings:  Normal pulmonary vein drainage into the left atrium.  Normal left atrial appendage without a thrombus.  Normal size of the pulmonary artery.  IMPRESSION: 1. Coronary calcium score of 8.6. This was 39 percentile for age and sex matched control.  2. Normal coronary origin with right dominance.  3. Minimal CAD. CADRADS 1. Aggressive medical therapy is recommended.  Thomasene Ripple, DO   Electronically Signed By: Thomasene Ripple DO  On: 11/10/2020 16:02  Narrative EXAM: OVER-READ INTERPRETATION  CT CHEST  The following report is an over-read performed by radiologist Dr. Charlett Nose of [facility] on 11/09/2020. This over-read does not include interpretation of cardiac or coronary anatomy or pathology. The coronary CTA interpretation by the cardiologist is attached.  COMPARISON:  None.  FINDINGS: Vascular: Heart is normal size.  Aorta normal caliber.  Mediastinum/Nodes: No adenopathy  Lungs/Pleura: Visualized lungs clear.  No effusions.  Upper Abdomen: Imaging into the upper abdomen demonstrates no acute findings.  Musculoskeletal: Chest wall soft tissues are unremarkable. No acute bony abnormality.  IMPRESSION: No acute or significant extracardiac abnormality.  Electronically Signed: By: Charlett Nose M.D. On: 11/09/2020 19:22           Recent Labs: 05/14/2023: ALT 19 05/17/2023: Magnesium 2.3 05/19/2023: BUN  7; Creatinine, Ser 1.07; Hemoglobin 10.8; Platelets 173; Potassium 4.2; Sodium 132  Recent Lipid Panel    Component Value Date/Time   LDLDIRECT 99 04/03/2023 1614     Risk Assessment/Calculations:      No BP recorded.  {Refresh Note OR Click here to enter BP  :1}***         Physical Exam:    VS:  There were no vitals taken for this visit.    Wt Readings from Last 3 Encounters:  05/21/23 197 lb 1.5 oz (89.4 kg)  05/14/23 198 lb 11.2 oz (90.1 kg)  04/17/23 210 lb 3.2 oz (95.3 kg)     GEN:  Well nourished, well developed in no acute distress HEENT: Normal NECK: No JVD; No carotid bruits LYMPHATICS: No lymphadenopathy CARDIAC: RRR, no murmurs, rubs, gallops RESPIRATORY:  Clear to auscultation without rales, wheezing or rhonchi  ABDOMEN: Soft, non-tender, non-distended MUSCULOSKELETAL:  No edema; No deformity  SKIN: Warm and dry NEUROLOGIC:  Alert and oriented x 3 PSYCHIATRIC:  Normal affect   ASSESSMENT:    No diagnosis found.   PLAN:    In order of problems listed above:  Coronary artery disease-three-vessel CAD per [facility].  Plans for CABG x 3 with Dr. Cliffton Mcguire on 05/16/2023.  Continue aspirin 81 mg daily, continue Lipitor 80 mg daily, continue Zetia 10 mg daily, continue nitroglycerin as needed.  He has some ongoing breathlessness, could be an anginal equivalent/deconditioning, we discussed starting Imdur and he is agreeable to that.  Will start Imdur 30 mg daily. Paroxysmal atrial tachycardia/symptomatic PVCs-quiescent.  Continue propranolol 20 mg as needed for heart rate greater than 120. Hyperlipidemia -most recent LDL was elevated at 152, continue Lipitor 80 mg daily, continue Zetia 10 mg daily.  Will plan to repeat FLP and LFTs at his next OV. 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  Continuing with aggressive risk reduction per above. Anxiety-ongoing anxiety understandably in light of the surrounding circumstances.  He was evaluated by ups PCP and  recommended that he start on citalopram however he is very hesitant to do so.  Encouraged him to consider at least for the perioperative timeframe.  Disposition -follow-up with general cardiology 3 weeks after surgery.      Medication Adjustments/Labs and Tests Ordered: Current medicines are reviewed at length with the patient today.  Concerns regarding medicines are outlined above.  No orders of the defined types were placed in this encounter.  No orders of the defined types were placed in this encounter.   There are no Patient Instructions on file for this visit.   Signed, Scott Dibble, NP  06/10/2023 3:44 PM  [facility]

## 2023-06-11 ENCOUNTER — Ambulatory Visit: Payer: BC Managed Care – PPO | Admitting: Cardiology

## 2023-06-17 NOTE — Progress Notes (Unsigned)
Cardiology Office Note:    Date:  06/19/2023   ID:  Scott Mcguire, DOB March 17, 1971, MRN [id number]  PCP:  Galvin Proffer, MD   [facility] Providers Cardiologist:  None     Referring MD: Galvin Proffer, MD    History of Present Illness:    Scott Mcguire is a 52 y.o. male with a hx of coronary artery disease s/p CABG times 12/20/2022, hypertension, migraines, paroxysmal atrial tachycardia, chronic prostatitis, hyperlipidemia, anxiety.  Coronary CTA in 2022 the calcium score of 8.6, minimal CAD. 03/29/23 coronary CTA calcium score 43.8, severe stenosis of the left circumflex and moderate stenosis of the LAD, FFR suggested flow-limiting stenosis in the LAD and left circumflex 04/04/2023 left heart cath multivessel coronary artery disease, consultation with CVTS for CABG 04/04/2023 echo EF 60 to 65%, mild LV hypertrophy of the basal septal segment, trivial MR 05/14/2023 carotid ultrasound near normal bilaterally with only minimal wall thickening 05/16/2023 CABG x 3  He was admitted on 05/16/2023 and underwent three-vessel bypass LIMA to LAD, SVG to ramus, SVG to diagonal.  He had an uncomplicated hospitalization and was discharged on postop day 5.  He had a telemedicine visit with Dr. Cliffton Asters on 06/01/2023, was doing well at this time, plan to return in approximately 3 weeks for repeat chest x-ray and cardiac rehab clearance.  He presents today accompanied by his wife for follow-up after his recent bypass surgery as outlined above.  He has been recovering well, somewhat frustrated with his lack of progress however I reassured him that he is healing appropriately and looking well.  He has had significant weight loss, approximately 40 pounds since June, he did drastically change his diet prior to his bypass surgery however he is really not had much of an appetite since his surgery, at times feeling nauseated.  He does have some chest soreness and fatigue, but otherwise feels okay.  He denies chest pain, palpitations, dyspnea, pnd, orthopnea, n, v, dizziness, syncope, edema, weight gain, or early satiety.    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   Tachycardia     Past Surgical History:  Procedure Laterality Date   CORONARY ARTERY BYPASS GRAFT N/A 05/16/2023   Procedure: CORONARY ARTERY BYPASS GRAFTING (CABG) X3 USING LEFT INTERNAL MAMMARY ARTERY (LIMA) AND ENDOSCOPICALLY HARVESTED RIGHT GREATER SAPHENOUS VEIN;  Surgeon: [name], MD;  Location: [facility] OR;  Service: Open Heart Surgery;  Laterality: N/A;   LEFT HEART CATH AND CORONARY ANGIOGRAPHY N/A 04/04/2023   Procedure: LEFT HEART CATH AND CORONARY ANGIOGRAPHY;  Surgeon: Corky Crafts, MD;  Location: [facility] INVASIVE CV LAB;  Service: Cardiovascular;  Laterality: N/A;   NO PAST SURGERIES     TEE WITHOUT CARDIOVERSION N/A 05/16/2023   Procedure: TRANSESOPHAGEAL ECHOCARDIOGRAM;  Surgeon: [name], MD;  Location: [facility] OR;  Service: Open Heart Surgery;  Laterality: N/A;    Current Medications: Current Meds  Medication Sig   acetaminophen (TYLENOL) 500 MG tablet Take 1,000 mg by mouth every 6 (six) hours as needed (pain/headaches.).   ALPRAZolam (XANAX) 1 MG tablet Take 1 mg by mouth at bedtime.   aspirin EC 325 MG tablet Take 1  abnormalities. The left ventricular internal cavity size was normal in size. There is mild asymmetric left ventricular hypertrophy of the basal-septal segment. Left ventricular diastolic parameters are indeterminate.  Right Ventricle: The right ventricular size is normal. No increase in right ventricular wall thickness. Right ventricular systolic function is normal. Tricuspid regurgitation signal is inadequate for assessing PA pressure.  Left Atrium: Left atrial size was normal in size.  Right Atrium: Right atrial size was normal in size.  Pericardium: There is no evidence of pericardial effusion.  Mitral Valve: The mitral valve is normal in structure. Trivial mitral valve regurgitation.  Tricuspid Valve: The tricuspid valve is normal in structure. Tricuspid valve regurgitation is trivial.  Aortic Valve: The aortic valve is tricuspid. Aortic valve regurgitation is not visualized. No aortic stenosis is present. Aortic valve peak gradient measures 4.7 mmHg.  Pulmonic Valve: The pulmonic valve was not well visualized. Pulmonic valve regurgitation is not visualized.  Aorta: The aortic root and ascending aorta are structurally normal, with no evidence of dilitation.  IAS/Shunts: The interatrial septum was not well visualized.   LEFT VENTRICLE PLAX 2D LVIDd:         5.30 cm LVIDs:         3.40 cm LV PW:         1.10 cm LV IVS:        1.20 cm LVOT diam:     2.00 cm LVOT Area:     3.14 cm   RIGHT VENTRICLE RV S prime:     14.80 cm/s TAPSE (M-mode): 3.1 cm  LEFT ATRIUM           Index LA Vol (A2C): 20.3 ml 9.38 ml/m LA Vol (A4C): 61.8 ml 28.54 ml/m AORTIC VALVE AV Area (Vmax): 2.48 cm AV Vmax:        108.00 cm/s AV Peak Grad:   4.7 mmHg LVOT Vmax:      85.40 cm/s  MITRAL VALVE MV Area (PHT): 3.50 cm     SHUNTS MV Decel Time: 217 msec     Systemic Diam: 2.00 cm MV E velocity: 79.70 cm/s MV A velocity: 104.00 cm/s MV E/A ratio:  0.77  Scott Lesches  MD Electronically signed by Scott Lesches MD Signature Date/Time: 04/04/2023/1:48:20 PM    Final   TEE  ECHO INTRAOPERATIVE TEE 05/16/2023  Narrative *INTRAOPERATIVE TRANSESOPHAGEAL REPORT *    Patient Name:   Scott Mcguire Date of Exam: 05/16/2023 Medical Rec #:  [id number]             Height:       70.0 in Accession #:    [id number]            Weight:       198.0 lb Date of Birth:  31-Dec-1970              BSA:          2.08 m Patient Age:    52 years              BP:           153/110 mmHg Patient Gender: M                     HR:           72 bpm. Exam Location:  Anesthesiology  Transesophogeal exam was perform intraoperatively during surgical procedure. Patient was closely monitored under general anesthesia during the entirety of examination.  Indications:  abnormalities. The left ventricular internal cavity size was normal in size. There is mild asymmetric left ventricular hypertrophy of the basal-septal segment. Left ventricular diastolic parameters are indeterminate.  Right Ventricle: The right ventricular size is normal. No increase in right ventricular wall thickness. Right ventricular systolic function is normal. Tricuspid regurgitation signal is inadequate for assessing PA pressure.  Left Atrium: Left atrial size was normal in size.  Right Atrium: Right atrial size was normal in size.  Pericardium: There is no evidence of pericardial effusion.  Mitral Valve: The mitral valve is normal in structure. Trivial mitral valve regurgitation.  Tricuspid Valve: The tricuspid valve is normal in structure. Tricuspid valve regurgitation is trivial.  Aortic Valve: The aortic valve is tricuspid. Aortic valve regurgitation is not visualized. No aortic stenosis is present. Aortic valve peak gradient measures 4.7 mmHg.  Pulmonic Valve: The pulmonic valve was not well visualized. Pulmonic valve regurgitation is not visualized.  Aorta: The aortic root and ascending aorta are structurally normal, with no evidence of dilitation.  IAS/Shunts: The interatrial septum was not well visualized.   LEFT VENTRICLE PLAX 2D LVIDd:         5.30 cm LVIDs:         3.40 cm LV PW:         1.10 cm LV IVS:        1.20 cm LVOT diam:     2.00 cm LVOT Area:     3.14 cm   RIGHT VENTRICLE RV S prime:     14.80 cm/s TAPSE (M-mode): 3.1 cm  LEFT ATRIUM           Index LA Vol (A2C): 20.3 ml 9.38 ml/m LA Vol (A4C): 61.8 ml 28.54 ml/m AORTIC VALVE AV Area (Vmax): 2.48 cm AV Vmax:        108.00 cm/s AV Peak Grad:   4.7 mmHg LVOT Vmax:      85.40 cm/s  MITRAL VALVE MV Area (PHT): 3.50 cm     SHUNTS MV Decel Time: 217 msec     Systemic Diam: 2.00 cm MV E velocity: 79.70 cm/s MV A velocity: 104.00 cm/s MV E/A ratio:  0.77  Scott Lesches  MD Electronically signed by Scott Lesches MD Signature Date/Time: 04/04/2023/1:48:20 PM    Final   TEE  ECHO INTRAOPERATIVE TEE 05/16/2023  Narrative *INTRAOPERATIVE TRANSESOPHAGEAL REPORT *    Patient Name:   Scott Mcguire Date of Exam: 05/16/2023 Medical Rec #:  [id number]             Height:       70.0 in Accession #:    [id number]            Weight:       198.0 lb Date of Birth:  31-Dec-1970              BSA:          2.08 m Patient Age:    52 years              BP:           153/110 mmHg Patient Gender: M                     HR:           72 bpm. Exam Location:  Anesthesiology  Transesophogeal exam was perform intraoperatively during surgical procedure. Patient was closely monitored under general anesthesia during the entirety of examination.  Indications:  abnormalities. The left ventricular internal cavity size was normal in size. There is mild asymmetric left ventricular hypertrophy of the basal-septal segment. Left ventricular diastolic parameters are indeterminate.  Right Ventricle: The right ventricular size is normal. No increase in right ventricular wall thickness. Right ventricular systolic function is normal. Tricuspid regurgitation signal is inadequate for assessing PA pressure.  Left Atrium: Left atrial size was normal in size.  Right Atrium: Right atrial size was normal in size.  Pericardium: There is no evidence of pericardial effusion.  Mitral Valve: The mitral valve is normal in structure. Trivial mitral valve regurgitation.  Tricuspid Valve: The tricuspid valve is normal in structure. Tricuspid valve regurgitation is trivial.  Aortic Valve: The aortic valve is tricuspid. Aortic valve regurgitation is not visualized. No aortic stenosis is present. Aortic valve peak gradient measures 4.7 mmHg.  Pulmonic Valve: The pulmonic valve was not well visualized. Pulmonic valve regurgitation is not visualized.  Aorta: The aortic root and ascending aorta are structurally normal, with no evidence of dilitation.  IAS/Shunts: The interatrial septum was not well visualized.   LEFT VENTRICLE PLAX 2D LVIDd:         5.30 cm LVIDs:         3.40 cm LV PW:         1.10 cm LV IVS:        1.20 cm LVOT diam:     2.00 cm LVOT Area:     3.14 cm   RIGHT VENTRICLE RV S prime:     14.80 cm/s TAPSE (M-mode): 3.1 cm  LEFT ATRIUM           Index LA Vol (A2C): 20.3 ml 9.38 ml/m LA Vol (A4C): 61.8 ml 28.54 ml/m AORTIC VALVE AV Area (Vmax): 2.48 cm AV Vmax:        108.00 cm/s AV Peak Grad:   4.7 mmHg LVOT Vmax:      85.40 cm/s  MITRAL VALVE MV Area (PHT): 3.50 cm     SHUNTS MV Decel Time: 217 msec     Systemic Diam: 2.00 cm MV E velocity: 79.70 cm/s MV A velocity: 104.00 cm/s MV E/A ratio:  0.77  Scott Lesches  MD Electronically signed by Scott Lesches MD Signature Date/Time: 04/04/2023/1:48:20 PM    Final   TEE  ECHO INTRAOPERATIVE TEE 05/16/2023  Narrative *INTRAOPERATIVE TRANSESOPHAGEAL REPORT *    Patient Name:   Scott Mcguire Date of Exam: 05/16/2023 Medical Rec #:  [id number]             Height:       70.0 in Accession #:    [id number]            Weight:       198.0 lb Date of Birth:  31-Dec-1970              BSA:          2.08 m Patient Age:    52 years              BP:           153/110 mmHg Patient Gender: M                     HR:           72 bpm. Exam Location:  Anesthesiology  Transesophogeal exam was perform intraoperatively during surgical procedure. Patient was closely monitored under general anesthesia during the entirety of examination.  Indications:  Cardiology Office Note:    Date:  06/19/2023   ID:  Scott Mcguire, DOB March 17, 1971, MRN [id number]  PCP:  Galvin Proffer, MD   [facility] Providers Cardiologist:  None     Referring MD: Galvin Proffer, MD    History of Present Illness:    Scott Mcguire is a 52 y.o. male with a hx of coronary artery disease s/p CABG times 12/20/2022, hypertension, migraines, paroxysmal atrial tachycardia, chronic prostatitis, hyperlipidemia, anxiety.  Coronary CTA in 2022 the calcium score of 8.6, minimal CAD. 03/29/23 coronary CTA calcium score 43.8, severe stenosis of the left circumflex and moderate stenosis of the LAD, FFR suggested flow-limiting stenosis in the LAD and left circumflex 04/04/2023 left heart cath multivessel coronary artery disease, consultation with CVTS for CABG 04/04/2023 echo EF 60 to 65%, mild LV hypertrophy of the basal septal segment, trivial MR 05/14/2023 carotid ultrasound near normal bilaterally with only minimal wall thickening 05/16/2023 CABG x 3  He was admitted on 05/16/2023 and underwent three-vessel bypass LIMA to LAD, SVG to ramus, SVG to diagonal.  He had an uncomplicated hospitalization and was discharged on postop day 5.  He had a telemedicine visit with Dr. Cliffton Asters on 06/01/2023, was doing well at this time, plan to return in approximately 3 weeks for repeat chest x-ray and cardiac rehab clearance.  He presents today accompanied by his wife for follow-up after his recent bypass surgery as outlined above.  He has been recovering well, somewhat frustrated with his lack of progress however I reassured him that he is healing appropriately and looking well.  He has had significant weight loss, approximately 40 pounds since June, he did drastically change his diet prior to his bypass surgery however he is really not had much of an appetite since his surgery, at times feeling nauseated.  He does have some chest soreness and fatigue, but otherwise feels okay.  He denies chest pain, palpitations, dyspnea, pnd, orthopnea, n, v, dizziness, syncope, edema, weight gain, or early satiety.    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   Tachycardia     Past Surgical History:  Procedure Laterality Date   CORONARY ARTERY BYPASS GRAFT N/A 05/16/2023   Procedure: CORONARY ARTERY BYPASS GRAFTING (CABG) X3 USING LEFT INTERNAL MAMMARY ARTERY (LIMA) AND ENDOSCOPICALLY HARVESTED RIGHT GREATER SAPHENOUS VEIN;  Surgeon: [name], MD;  Location: [facility] OR;  Service: Open Heart Surgery;  Laterality: N/A;   LEFT HEART CATH AND CORONARY ANGIOGRAPHY N/A 04/04/2023   Procedure: LEFT HEART CATH AND CORONARY ANGIOGRAPHY;  Surgeon: Corky Crafts, MD;  Location: [facility] INVASIVE CV LAB;  Service: Cardiovascular;  Laterality: N/A;   NO PAST SURGERIES     TEE WITHOUT CARDIOVERSION N/A 05/16/2023   Procedure: TRANSESOPHAGEAL ECHOCARDIOGRAM;  Surgeon: [name], MD;  Location: [facility] OR;  Service: Open Heart Surgery;  Laterality: N/A;    Current Medications: Current Meds  Medication Sig   acetaminophen (TYLENOL) 500 MG tablet Take 1,000 mg by mouth every 6 (six) hours as needed (pain/headaches.).   ALPRAZolam (XANAX) 1 MG tablet Take 1 mg by mouth at bedtime.   aspirin EC 325 MG tablet Take 1  Cardiology Office Note:    Date:  06/19/2023   ID:  Scott Mcguire, DOB March 17, 1971, MRN [id number]  PCP:  Galvin Proffer, MD   [facility] Providers Cardiologist:  None     Referring MD: Galvin Proffer, MD    History of Present Illness:    Scott Mcguire is a 52 y.o. male with a hx of coronary artery disease s/p CABG times 12/20/2022, hypertension, migraines, paroxysmal atrial tachycardia, chronic prostatitis, hyperlipidemia, anxiety.  Coronary CTA in 2022 the calcium score of 8.6, minimal CAD. 03/29/23 coronary CTA calcium score 43.8, severe stenosis of the left circumflex and moderate stenosis of the LAD, FFR suggested flow-limiting stenosis in the LAD and left circumflex 04/04/2023 left heart cath multivessel coronary artery disease, consultation with CVTS for CABG 04/04/2023 echo EF 60 to 65%, mild LV hypertrophy of the basal septal segment, trivial MR 05/14/2023 carotid ultrasound near normal bilaterally with only minimal wall thickening 05/16/2023 CABG x 3  He was admitted on 05/16/2023 and underwent three-vessel bypass LIMA to LAD, SVG to ramus, SVG to diagonal.  He had an uncomplicated hospitalization and was discharged on postop day 5.  He had a telemedicine visit with Dr. Cliffton Asters on 06/01/2023, was doing well at this time, plan to return in approximately 3 weeks for repeat chest x-ray and cardiac rehab clearance.  He presents today accompanied by his wife for follow-up after his recent bypass surgery as outlined above.  He has been recovering well, somewhat frustrated with his lack of progress however I reassured him that he is healing appropriately and looking well.  He has had significant weight loss, approximately 40 pounds since June, he did drastically change his diet prior to his bypass surgery however he is really not had much of an appetite since his surgery, at times feeling nauseated.  He does have some chest soreness and fatigue, but otherwise feels okay.  He denies chest pain, palpitations, dyspnea, pnd, orthopnea, n, v, dizziness, syncope, edema, weight gain, or early satiety.    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   Tachycardia     Past Surgical History:  Procedure Laterality Date   CORONARY ARTERY BYPASS GRAFT N/A 05/16/2023   Procedure: CORONARY ARTERY BYPASS GRAFTING (CABG) X3 USING LEFT INTERNAL MAMMARY ARTERY (LIMA) AND ENDOSCOPICALLY HARVESTED RIGHT GREATER SAPHENOUS VEIN;  Surgeon: [name], MD;  Location: [facility] OR;  Service: Open Heart Surgery;  Laterality: N/A;   LEFT HEART CATH AND CORONARY ANGIOGRAPHY N/A 04/04/2023   Procedure: LEFT HEART CATH AND CORONARY ANGIOGRAPHY;  Surgeon: Corky Crafts, MD;  Location: [facility] INVASIVE CV LAB;  Service: Cardiovascular;  Laterality: N/A;   NO PAST SURGERIES     TEE WITHOUT CARDIOVERSION N/A 05/16/2023   Procedure: TRANSESOPHAGEAL ECHOCARDIOGRAM;  Surgeon: [name], MD;  Location: [facility] OR;  Service: Open Heart Surgery;  Laterality: N/A;    Current Medications: Current Meds  Medication Sig   acetaminophen (TYLENOL) 500 MG tablet Take 1,000 mg by mouth every 6 (six) hours as needed (pain/headaches.).   ALPRAZolam (XANAX) 1 MG tablet Take 1 mg by mouth at bedtime.   aspirin EC 325 MG tablet Take 1  Cardiology Office Note:    Date:  06/19/2023   ID:  Scott Mcguire, DOB March 17, 1971, MRN [id number]  PCP:  Galvin Proffer, MD   [facility] Providers Cardiologist:  None     Referring MD: Galvin Proffer, MD    History of Present Illness:    Scott Mcguire is a 52 y.o. male with a hx of coronary artery disease s/p CABG times 12/20/2022, hypertension, migraines, paroxysmal atrial tachycardia, chronic prostatitis, hyperlipidemia, anxiety.  Coronary CTA in 2022 the calcium score of 8.6, minimal CAD. 03/29/23 coronary CTA calcium score 43.8, severe stenosis of the left circumflex and moderate stenosis of the LAD, FFR suggested flow-limiting stenosis in the LAD and left circumflex 04/04/2023 left heart cath multivessel coronary artery disease, consultation with CVTS for CABG 04/04/2023 echo EF 60 to 65%, mild LV hypertrophy of the basal septal segment, trivial MR 05/14/2023 carotid ultrasound near normal bilaterally with only minimal wall thickening 05/16/2023 CABG x 3  He was admitted on 05/16/2023 and underwent three-vessel bypass LIMA to LAD, SVG to ramus, SVG to diagonal.  He had an uncomplicated hospitalization and was discharged on postop day 5.  He had a telemedicine visit with Dr. Cliffton Asters on 06/01/2023, was doing well at this time, plan to return in approximately 3 weeks for repeat chest x-ray and cardiac rehab clearance.  He presents today accompanied by his wife for follow-up after his recent bypass surgery as outlined above.  He has been recovering well, somewhat frustrated with his lack of progress however I reassured him that he is healing appropriately and looking well.  He has had significant weight loss, approximately 40 pounds since June, he did drastically change his diet prior to his bypass surgery however he is really not had much of an appetite since his surgery, at times feeling nauseated.  He does have some chest soreness and fatigue, but otherwise feels okay.  He denies chest pain, palpitations, dyspnea, pnd, orthopnea, n, v, dizziness, syncope, edema, weight gain, or early satiety.    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   Tachycardia     Past Surgical History:  Procedure Laterality Date   CORONARY ARTERY BYPASS GRAFT N/A 05/16/2023   Procedure: CORONARY ARTERY BYPASS GRAFTING (CABG) X3 USING LEFT INTERNAL MAMMARY ARTERY (LIMA) AND ENDOSCOPICALLY HARVESTED RIGHT GREATER SAPHENOUS VEIN;  Surgeon: [name], MD;  Location: [facility] OR;  Service: Open Heart Surgery;  Laterality: N/A;   LEFT HEART CATH AND CORONARY ANGIOGRAPHY N/A 04/04/2023   Procedure: LEFT HEART CATH AND CORONARY ANGIOGRAPHY;  Surgeon: Corky Crafts, MD;  Location: [facility] INVASIVE CV LAB;  Service: Cardiovascular;  Laterality: N/A;   NO PAST SURGERIES     TEE WITHOUT CARDIOVERSION N/A 05/16/2023   Procedure: TRANSESOPHAGEAL ECHOCARDIOGRAM;  Surgeon: [name], MD;  Location: [facility] OR;  Service: Open Heart Surgery;  Laterality: N/A;    Current Medications: Current Meds  Medication Sig   acetaminophen (TYLENOL) 500 MG tablet Take 1,000 mg by mouth every 6 (six) hours as needed (pain/headaches.).   ALPRAZolam (XANAX) 1 MG tablet Take 1 mg by mouth at bedtime.   aspirin EC 325 MG tablet Take 1  Cardiology Office Note:    Date:  06/19/2023   ID:  Scott Mcguire, DOB March 17, 1971, MRN [id number]  PCP:  Galvin Proffer, MD   [facility] Providers Cardiologist:  None     Referring MD: Galvin Proffer, MD    History of Present Illness:    Scott Mcguire is a 52 y.o. male with a hx of coronary artery disease s/p CABG times 12/20/2022, hypertension, migraines, paroxysmal atrial tachycardia, chronic prostatitis, hyperlipidemia, anxiety.  Coronary CTA in 2022 the calcium score of 8.6, minimal CAD. 03/29/23 coronary CTA calcium score 43.8, severe stenosis of the left circumflex and moderate stenosis of the LAD, FFR suggested flow-limiting stenosis in the LAD and left circumflex 04/04/2023 left heart cath multivessel coronary artery disease, consultation with CVTS for CABG 04/04/2023 echo EF 60 to 65%, mild LV hypertrophy of the basal septal segment, trivial MR 05/14/2023 carotid ultrasound near normal bilaterally with only minimal wall thickening 05/16/2023 CABG x 3  He was admitted on 05/16/2023 and underwent three-vessel bypass LIMA to LAD, SVG to ramus, SVG to diagonal.  He had an uncomplicated hospitalization and was discharged on postop day 5.  He had a telemedicine visit with Dr. Cliffton Asters on 06/01/2023, was doing well at this time, plan to return in approximately 3 weeks for repeat chest x-ray and cardiac rehab clearance.  He presents today accompanied by his wife for follow-up after his recent bypass surgery as outlined above.  He has been recovering well, somewhat frustrated with his lack of progress however I reassured him that he is healing appropriately and looking well.  He has had significant weight loss, approximately 40 pounds since June, he did drastically change his diet prior to his bypass surgery however he is really not had much of an appetite since his surgery, at times feeling nauseated.  He does have some chest soreness and fatigue, but otherwise feels okay.  He denies chest pain, palpitations, dyspnea, pnd, orthopnea, n, v, dizziness, syncope, edema, weight gain, or early satiety.    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   Tachycardia     Past Surgical History:  Procedure Laterality Date   CORONARY ARTERY BYPASS GRAFT N/A 05/16/2023   Procedure: CORONARY ARTERY BYPASS GRAFTING (CABG) X3 USING LEFT INTERNAL MAMMARY ARTERY (LIMA) AND ENDOSCOPICALLY HARVESTED RIGHT GREATER SAPHENOUS VEIN;  Surgeon: [name], MD;  Location: [facility] OR;  Service: Open Heart Surgery;  Laterality: N/A;   LEFT HEART CATH AND CORONARY ANGIOGRAPHY N/A 04/04/2023   Procedure: LEFT HEART CATH AND CORONARY ANGIOGRAPHY;  Surgeon: Corky Crafts, MD;  Location: [facility] INVASIVE CV LAB;  Service: Cardiovascular;  Laterality: N/A;   NO PAST SURGERIES     TEE WITHOUT CARDIOVERSION N/A 05/16/2023   Procedure: TRANSESOPHAGEAL ECHOCARDIOGRAM;  Surgeon: [name], MD;  Location: [facility] OR;  Service: Open Heart Surgery;  Laterality: N/A;    Current Medications: Current Meds  Medication Sig   acetaminophen (TYLENOL) 500 MG tablet Take 1,000 mg by mouth every 6 (six) hours as needed (pain/headaches.).   ALPRAZolam (XANAX) 1 MG tablet Take 1 mg by mouth at bedtime.   aspirin EC 325 MG tablet Take 1  Cardiology Office Note:    Date:  06/19/2023   ID:  Scott Mcguire, DOB March 17, 1971, MRN [id number]  PCP:  Galvin Proffer, MD   [facility] Providers Cardiologist:  None     Referring MD: Galvin Proffer, MD    History of Present Illness:    Scott Mcguire is a 52 y.o. male with a hx of coronary artery disease s/p CABG times 12/20/2022, hypertension, migraines, paroxysmal atrial tachycardia, chronic prostatitis, hyperlipidemia, anxiety.  Coronary CTA in 2022 the calcium score of 8.6, minimal CAD. 03/29/23 coronary CTA calcium score 43.8, severe stenosis of the left circumflex and moderate stenosis of the LAD, FFR suggested flow-limiting stenosis in the LAD and left circumflex 04/04/2023 left heart cath multivessel coronary artery disease, consultation with CVTS for CABG 04/04/2023 echo EF 60 to 65%, mild LV hypertrophy of the basal septal segment, trivial MR 05/14/2023 carotid ultrasound near normal bilaterally with only minimal wall thickening 05/16/2023 CABG x 3  He was admitted on 05/16/2023 and underwent three-vessel bypass LIMA to LAD, SVG to ramus, SVG to diagonal.  He had an uncomplicated hospitalization and was discharged on postop day 5.  He had a telemedicine visit with Dr. Cliffton Asters on 06/01/2023, was doing well at this time, plan to return in approximately 3 weeks for repeat chest x-ray and cardiac rehab clearance.  He presents today accompanied by his wife for follow-up after his recent bypass surgery as outlined above.  He has been recovering well, somewhat frustrated with his lack of progress however I reassured him that he is healing appropriately and looking well.  He has had significant weight loss, approximately 40 pounds since June, he did drastically change his diet prior to his bypass surgery however he is really not had much of an appetite since his surgery, at times feeling nauseated.  He does have some chest soreness and fatigue, but otherwise feels okay.  He denies chest pain, palpitations, dyspnea, pnd, orthopnea, n, v, dizziness, syncope, edema, weight gain, or early satiety.    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   Tachycardia     Past Surgical History:  Procedure Laterality Date   CORONARY ARTERY BYPASS GRAFT N/A 05/16/2023   Procedure: CORONARY ARTERY BYPASS GRAFTING (CABG) X3 USING LEFT INTERNAL MAMMARY ARTERY (LIMA) AND ENDOSCOPICALLY HARVESTED RIGHT GREATER SAPHENOUS VEIN;  Surgeon: [name], MD;  Location: [facility] OR;  Service: Open Heart Surgery;  Laterality: N/A;   LEFT HEART CATH AND CORONARY ANGIOGRAPHY N/A 04/04/2023   Procedure: LEFT HEART CATH AND CORONARY ANGIOGRAPHY;  Surgeon: Corky Crafts, MD;  Location: [facility] INVASIVE CV LAB;  Service: Cardiovascular;  Laterality: N/A;   NO PAST SURGERIES     TEE WITHOUT CARDIOVERSION N/A 05/16/2023   Procedure: TRANSESOPHAGEAL ECHOCARDIOGRAM;  Surgeon: [name], MD;  Location: [facility] OR;  Service: Open Heart Surgery;  Laterality: N/A;    Current Medications: Current Meds  Medication Sig   acetaminophen (TYLENOL) 500 MG tablet Take 1,000 mg by mouth every 6 (six) hours as needed (pain/headaches.).   ALPRAZolam (XANAX) 1 MG tablet Take 1 mg by mouth at bedtime.   aspirin EC 325 MG tablet Take 1

## 2023-06-19 ENCOUNTER — Ambulatory Visit: Payer: BC Managed Care – PPO | Attending: Cardiology | Admitting: Cardiology

## 2023-06-19 ENCOUNTER — Encounter: Payer: Self-pay | Admitting: Cardiology

## 2023-06-19 VITALS — BP 112/81 | HR 88 | Ht 70.0 in | Wt 185.0 lb

## 2023-06-19 DIAGNOSIS — I2510 Atherosclerotic heart disease of native coronary artery without angina pectoris: Secondary | ICD-10-CM

## 2023-06-19 DIAGNOSIS — I10 Essential (primary) hypertension: Secondary | ICD-10-CM

## 2023-06-19 DIAGNOSIS — I4719 Other supraventricular tachycardia: Secondary | ICD-10-CM

## 2023-06-19 DIAGNOSIS — R634 Abnormal weight loss: Secondary | ICD-10-CM

## 2023-06-19 DIAGNOSIS — Z79899 Other long term (current) drug therapy: Secondary | ICD-10-CM | POA: Diagnosis not present

## 2023-06-19 DIAGNOSIS — E7841 Elevated Lipoprotein(a): Secondary | ICD-10-CM

## 2023-06-19 DIAGNOSIS — E785 Hyperlipidemia, unspecified: Secondary | ICD-10-CM

## 2023-06-19 NOTE — Patient Instructions (Signed)
Medication Instructions:  Your physician recommends that you continue on your current medications as directed. Please refer to the Current Medication list given to you today.  *If you need a refill on your cardiac medications before your next appointment, please call your pharmacy*   Lab Work: Your physician recommends that you return for lab work in: Today for CBC and BMP  If you have labs (blood work) drawn today and your tests are completely normal, you will receive your results only by: MyChart Message (if you have MyChart) OR A paper copy in the mail If you have any lab test that is abnormal or we need to change your treatment, we will call you to review the results.   Testing/Procedures: NONE   Follow-Up: At [facility], you and your health needs are our priority.  As part of our continuing mission to provide you with exceptional heart care, we have created designated Provider Care Teams.  These Care Teams include your primary Cardiologist (physician) and Advanced Practice Providers (APPs -  Physician Assistants and Nurse Practitioners) who all work together to provide you with the care you need, when you need it.  We recommend signing up for the patient portal called "MyChart".  Sign up information is provided on this After Visit Summary.  MyChart is used to connect with patients for Virtual Visits (Telemedicine).  Patients are able to view lab/test results, encounter notes, upcoming appointments, etc.  Non-urgent messages can be sent to your provider as well.   To learn more about what you can do with MyChart, go to [email or web address].    Your next appointment:   3 month(s)  Provider:   Wallis Bamberg, NP Rosalita Levan)    Other Instructions

## 2023-06-20 ENCOUNTER — Telehealth: Payer: Self-pay

## 2023-06-20 LAB — BASIC METABOLIC PANEL WITH GFR
BUN/Creatinine Ratio: 11 (ref 9–20)
BUN: 11 mg/dL (ref 6–24)
CO2: 23 mmol/L (ref 20–29)
Calcium: 9.9 mg/dL (ref 8.7–10.2)
Chloride: 101 mmol/L (ref 96–106)
Creatinine, Ser: 1.03 mg/dL (ref 0.76–1.27)
Glucose: 95 mg/dL (ref 70–99)
Potassium: 4.2 mmol/L (ref 3.5–5.2)
Sodium: 141 mmol/L (ref 134–144)
eGFR: 87 mL/min/1.73

## 2023-06-20 LAB — CBC WITH DIFFERENTIAL/PLATELET
Basophils Absolute: 0.1 x10E3/uL (ref 0.0–0.2)
Basos: 1 %
EOS (ABSOLUTE): 1.2 x10E3/uL — ABNORMAL HIGH (ref 0.0–0.4)
Eos: 13 %
Hematocrit: 43.8 % (ref 37.5–51.0)
Hemoglobin: 13.9 g/dL (ref 13.0–17.7)
Immature Grans (Abs): 0 x10E3/uL (ref 0.0–0.1)
Immature Granulocytes: 0 %
Lymphocytes Absolute: 2 x10E3/uL (ref 0.7–3.1)
Lymphs: 22 %
MCH: 29.3 pg (ref 26.6–33.0)
MCHC: 31.7 g/dL (ref 31.5–35.7)
MCV: 92 fL (ref 79–97)
Monocytes Absolute: 0.8 x10E3/uL (ref 0.1–0.9)
Monocytes: 9 %
Neutrophils Absolute: 4.8 x10E3/uL (ref 1.4–7.0)
Neutrophils: 55 %
Platelets: 393 x10E3/uL (ref 150–450)
RBC: 4.74 x10E6/uL (ref 4.14–5.80)
RDW: 12.3 % (ref 11.6–15.4)
WBC: 8.9 x10E3/uL (ref 3.4–10.8)

## 2023-06-20 NOTE — Telephone Encounter (Signed)
Patient notified through my chart.

## 2023-06-20 NOTE — Telephone Encounter (Signed)
-----   Message from Flossie Dibble sent at 06/20/2023 11:20 AM EDT ----- CBC looks excellent, electrolytes look great.  I do not see where the LDL was checked, we may not have ordered that after all.  We will check it at his next office visit, I do expect it is probably well-controlled now with his significant weight loss.

## 2023-07-04 ENCOUNTER — Other Ambulatory Visit: Payer: Self-pay | Admitting: Thoracic Surgery (Cardiothoracic Vascular Surgery)

## 2023-07-04 DIAGNOSIS — Z951 Presence of aortocoronary bypass graft: Secondary | ICD-10-CM

## 2023-07-05 ENCOUNTER — Ambulatory Visit
Admission: RE | Admit: 2023-07-05 | Discharge: 2023-07-05 | Disposition: A | Discharge status: Home / Self Care | Payer: BC Managed Care – PPO | Source: Ambulatory Visit | Attending: Thoracic Surgery (Cardiothoracic Vascular Surgery) | Admitting: Thoracic Surgery (Cardiothoracic Vascular Surgery)

## 2023-07-05 ENCOUNTER — Ambulatory Visit (INDEPENDENT_AMBULATORY_CARE_PROVIDER_SITE_OTHER): Payer: Self-pay | Admitting: Physician Assistant

## 2023-07-05 ENCOUNTER — Encounter: Payer: Self-pay | Admitting: Physician Assistant

## 2023-07-05 VITALS — BP 101/70 | HR 58 | Resp 20 | Ht 70.0 in | Wt 186.0 lb

## 2023-07-05 DIAGNOSIS — Z951 Presence of aortocoronary bypass graft: Secondary | ICD-10-CM | POA: Diagnosis not present

## 2023-07-05 NOTE — Patient Instructions (Addendum)
 -  You may participate in cardiac rehab.  -Recommend decreasing the metoprolol to 12.5 mg (half tablet) twice daily  -Continue to observe sternal precautions with no strenuous activity or lifting      greater than 15 pounds for another 6 weeks.  After that, you may gradually increase your activity without limitation.  -Follow-up with CT surgery as needed.  Please contact us if we may assist you in any way.

## 2023-07-05 NOTE — Progress Notes (Signed)
[address]       [place] [address]             [phone]       HPI: Mr. Scott Mcguire is a 52 year old male with a past history of hypertension, dyslipidemia, obesity, and paroxysmal atrial tachycardia.  He was recently discovered to have significant coronary artery disease manifested by angina and dyspnea on exertion.  He had preserved LV function.  Coronary bypass grafting was recommended by Dr. Cliffton Asters after review of his coronary angiography.  CABG x 3 was carried out on 05/16/2023 with a left internal mammary artery to the left anterior descending coronary artery.  Saphenous vein grafts were placed to the first diagonal and ramus intermediate coronary arteries.  Other than his chronic paroxysmal atrial tachycardia, his postoperative course was entirely uncomplicated.  He was discharged home on postop day 5 on metoprolol and diltiazem in sinus rhythm with acceptable heart rate control.  He returns today for scheduled follow-up. Since his discharge home, he notes having poor appetite for several weeks was some weight loss.  Over the last week his appetite is improved and he is actually gained about 5 pounds back.  He has not required any pain medication since the first week following discharge.  He has returned to work most which he does from home by computer.  He reports having some bradycardia his heart rate as low as 39.  He has his beta-blocker on his occasions.  He denies having any further palpitations.   Current Outpatient Medications  Medication Sig Dispense Refill   acetaminophen (TYLENOL) 500 MG tablet Take 1,000 mg by mouth every 6 (six) hours as needed (pain/headaches.).     ALPRAZolam (XANAX) 1 MG tablet Take 1 mg by mouth at bedtime.     aspirin EC 325 MG tablet Take 1 tablet (325 mg total) by mouth daily.     atorvastatin (LIPITOR) 80 MG tablet Take 80 mg by mouth in the morning.     cetirizine (ZYRTEC) 10 MG tablet Take 10 mg by mouth daily as needed  for allergies.     diltiazem (CARDIZEM CD) 240 MG 24 hr capsule Take 1 capsule (240 mg total) by mouth daily. 90 capsule 3   ezetimibe (ZETIA) 10 MG tablet Take 1 tablet (10 mg total) by mouth at bedtime. 90 tablet 3   guaiFENesin (MUCINEX) 600 MG 12 hr tablet Take 1 tablet (600 mg total) by mouth 2 (two) times daily.     ketotifen (ALAWAY) 0.035 % ophthalmic solution Place 1 drop into both eyes 2 (two) times daily as needed (allergy eyes/irritation.).     metoprolol tartrate (LOPRESSOR) 25 MG tablet Take 1 tablet (25 mg total) by mouth 2 (two) times daily. 60 tablet 3   nitroGLYCERIN (NITROSTAT) 0.4 MG SL tablet Place 1 tablet (0.4 mg total) under the tongue every 5 (five) minutes as needed for chest pain. 25 tablet 1   omeprazole (PRILOSEC) 40 MG capsule Take 40 mg by mouth daily before breakfast.     No current facility-administered medications for this visit.    Physical Exam: Vital signs: BP 101/78 Pulse 58 Respirations 20 SpO2 97% on room air  General: 52 year old male in no distress.  Ambulates with a steady gait unassisted. Heart: Regular rate and rhythm Chest: Breath sounds are full, equal, and clear to auscultation.  Sternotomy incision is healing with no sign of complication.  Sternum is stable.  Chest x-ray has not yet been  read but the images were reviewed and show clear lung fields with no significant effusions.  Sternal wires are well aligned.  The cardiac silhouette is normal size and contour. Extremities: Well-perfused, no peripheral edema.  The EVH incisions are healing with no sign of complication.   Diagnostic Tests: Chest x-ray today: Result pending at the time of this dictation  Impression / Plan: Progressive recovery following CABG x 3 for two-vessel coronary artery disease and preserved LV function. Given his reported bradycardia at home and a heart rate of 58 here in the office today, I asked him to reduce his metoprolol to 12.5 mg (or half tablet) twice daily  and continue to monitor his heart rate. He is cleared to participate in cardiac rehab, referral was made.   He should continue to observe sternal precautions for another 6 weeks.  After that he may gradually advance activity without limitation. Follow-up with CT surgery as needed.  Leary Roca, PA-C [facility] [phone]

## 2023-08-14 DIAGNOSIS — E785 Hyperlipidemia, unspecified: Secondary | ICD-10-CM | POA: Diagnosis not present

## 2023-08-14 DIAGNOSIS — F419 Anxiety disorder, unspecified: Secondary | ICD-10-CM | POA: Diagnosis not present

## 2023-08-14 DIAGNOSIS — R000 Tachycardia, unspecified: Secondary | ICD-10-CM | POA: Diagnosis not present

## 2023-08-14 DIAGNOSIS — I25119 Atherosclerotic heart disease of native coronary artery with unspecified angina pectoris: Secondary | ICD-10-CM | POA: Diagnosis not present

## 2023-08-14 DIAGNOSIS — I10 Essential (primary) hypertension: Secondary | ICD-10-CM | POA: Diagnosis not present

## 2023-08-14 DIAGNOSIS — Z955 Presence of coronary angioplasty implant and graft: Secondary | ICD-10-CM | POA: Diagnosis not present

## 2023-08-20 DIAGNOSIS — I10 Essential (primary) hypertension: Secondary | ICD-10-CM | POA: Diagnosis not present

## 2023-08-20 DIAGNOSIS — Z955 Presence of coronary angioplasty implant and graft: Secondary | ICD-10-CM | POA: Diagnosis not present

## 2023-08-20 DIAGNOSIS — F419 Anxiety disorder, unspecified: Secondary | ICD-10-CM | POA: Diagnosis not present

## 2023-08-20 DIAGNOSIS — R000 Tachycardia, unspecified: Secondary | ICD-10-CM | POA: Diagnosis not present

## 2023-08-20 DIAGNOSIS — E785 Hyperlipidemia, unspecified: Secondary | ICD-10-CM | POA: Diagnosis not present

## 2023-08-20 DIAGNOSIS — I25119 Atherosclerotic heart disease of native coronary artery with unspecified angina pectoris: Secondary | ICD-10-CM | POA: Diagnosis not present

## 2023-08-24 DIAGNOSIS — F419 Anxiety disorder, unspecified: Secondary | ICD-10-CM | POA: Diagnosis not present

## 2023-08-24 DIAGNOSIS — R000 Tachycardia, unspecified: Secondary | ICD-10-CM | POA: Diagnosis not present

## 2023-08-24 DIAGNOSIS — I25119 Atherosclerotic heart disease of native coronary artery with unspecified angina pectoris: Secondary | ICD-10-CM | POA: Diagnosis not present

## 2023-08-24 DIAGNOSIS — E785 Hyperlipidemia, unspecified: Secondary | ICD-10-CM | POA: Diagnosis not present

## 2023-08-24 DIAGNOSIS — I10 Essential (primary) hypertension: Secondary | ICD-10-CM | POA: Diagnosis not present

## 2023-08-24 DIAGNOSIS — Z955 Presence of coronary angioplasty implant and graft: Secondary | ICD-10-CM | POA: Diagnosis not present

## 2023-08-27 DIAGNOSIS — Z955 Presence of coronary angioplasty implant and graft: Secondary | ICD-10-CM | POA: Diagnosis not present

## 2023-08-27 DIAGNOSIS — E785 Hyperlipidemia, unspecified: Secondary | ICD-10-CM | POA: Diagnosis not present

## 2023-08-27 DIAGNOSIS — I25119 Atherosclerotic heart disease of native coronary artery with unspecified angina pectoris: Secondary | ICD-10-CM | POA: Diagnosis not present

## 2023-08-27 DIAGNOSIS — I10 Essential (primary) hypertension: Secondary | ICD-10-CM | POA: Diagnosis not present

## 2023-08-27 DIAGNOSIS — R000 Tachycardia, unspecified: Secondary | ICD-10-CM | POA: Diagnosis not present

## 2023-08-27 DIAGNOSIS — F419 Anxiety disorder, unspecified: Secondary | ICD-10-CM | POA: Diagnosis not present

## 2023-08-29 DIAGNOSIS — Z955 Presence of coronary angioplasty implant and graft: Secondary | ICD-10-CM | POA: Diagnosis not present

## 2023-08-29 DIAGNOSIS — E785 Hyperlipidemia, unspecified: Secondary | ICD-10-CM | POA: Diagnosis not present

## 2023-08-29 DIAGNOSIS — F419 Anxiety disorder, unspecified: Secondary | ICD-10-CM | POA: Diagnosis not present

## 2023-08-29 DIAGNOSIS — I10 Essential (primary) hypertension: Secondary | ICD-10-CM | POA: Diagnosis not present

## 2023-08-29 DIAGNOSIS — R000 Tachycardia, unspecified: Secondary | ICD-10-CM | POA: Diagnosis not present

## 2023-08-29 DIAGNOSIS — I25119 Atherosclerotic heart disease of native coronary artery with unspecified angina pectoris: Secondary | ICD-10-CM | POA: Diagnosis not present

## 2023-09-05 DIAGNOSIS — Z951 Presence of aortocoronary bypass graft: Secondary | ICD-10-CM | POA: Diagnosis not present

## 2023-09-12 DIAGNOSIS — Z951 Presence of aortocoronary bypass graft: Secondary | ICD-10-CM | POA: Diagnosis not present

## 2023-09-17 DIAGNOSIS — Z951 Presence of aortocoronary bypass graft: Secondary | ICD-10-CM | POA: Diagnosis not present

## 2023-09-17 NOTE — Progress Notes (Deleted)
Cardiology Office Note:    Date:  09/17/2023   ID:  Scott Mcguire, DOB 15-May-1971, MRN [id number]  PCP:  Galvin Proffer, MD   [facility] Providers Cardiologist:  None     Referring MD: Galvin Proffer, MD    History of Present Illness:    Scott Mcguire is a 52 y.o. male with a hx of coronary artery disease s/p CABG times 12/20/2022, hypertension, migraines, paroxysmal atrial tachycardia, chronic prostatitis, hyperlipidemia, anxiety.  Coronary CTA in 2022 the calcium score of 8.6, minimal CAD. 03/29/23 coronary CTA calcium score 43.8, severe stenosis of the left circumflex and moderate stenosis of the LAD, FFR suggested flow-limiting stenosis in the LAD and left circumflex 04/04/2023 left heart cath multivessel coronary artery disease, consultation with CVTS for CABG 04/04/2023 echo EF 60 to 65%, mild LV hypertrophy of the basal septal segment, trivial MR 05/14/2023 carotid ultrasound near normal bilaterally with only minimal wall thickening 05/16/2023 CABG x 3  He was admitted on 05/16/2023 and underwent three-vessel bypass LIMA to LAD, SVG to ramus, SVG to diagonal.  He had an uncomplicated hospitalization and was discharged on postop day 5.  He had a telemedicine visit with Dr. Cliffton Asters on 06/01/2023, was doing well at this time, plan to return in approximately 3 weeks for repeat chest x-ray and cardiac rehab clearance.  Evaluated by general cardiology on 06/19/2023, was doing well, he was frustrated by his lack of progress, he had lost 40 pounds intentionally.     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   Tachycardia     Past Surgical History:  Procedure  Laterality Date   CORONARY ARTERY BYPASS GRAFT N/A 05/16/2023   Procedure: CORONARY ARTERY BYPASS GRAFTING (CABG) X3 USING LEFT INTERNAL MAMMARY ARTERY (LIMA) AND ENDOSCOPICALLY HARVESTED RIGHT GREATER SAPHENOUS VEIN;  Surgeon: [name], MD;  Location: [facility] OR;  Service: Open Heart Surgery;  Laterality: N/A;   LEFT HEART CATH AND CORONARY ANGIOGRAPHY N/A 04/04/2023   Procedure: LEFT HEART CATH AND CORONARY ANGIOGRAPHY;  Surgeon: Corky Crafts, MD;  Location: [facility] INVASIVE CV LAB;  Service: Cardiovascular;  Laterality: N/A;   NO PAST SURGERIES     TEE WITHOUT CARDIOVERSION N/A 05/16/2023   Procedure: TRANSESOPHAGEAL ECHOCARDIOGRAM;  Surgeon: [name], MD;  Location: [facility] OR;  Service: Open Heart Surgery;  Laterality: N/A;    Current Medications: No outpatient medications have been marked as taking for the 09/18/23 encounter (Appointment) with Flossie Dibble, NP.     Allergies:   Ceftin [cefuroxime]   Social History   Socioeconomic History   Marital status: Married    Spouse name: Not on file   Number of children: Not on file   Years of education: Not on file   Highest education level: Not on file  Occupational History   Not on file  Tobacco Use   Smoking status: Never   Smokeless tobacco: Never  Substance and Sexual Activity   Alcohol use: Never   Drug use: Never   Sexual activity: Not on file  Other Topics Concern   Not on file  Social History Narrative   Not on file   Social  Drivers of Corporate investment banker Strain: Not on file  Food Insecurity: Not on file  Transportation Needs: Not on file  Physical Activity: Not on file  Stress: Not on file  Social Connections: Not on file     Family History: The patient's family history includes Heart disease in his father and mother.  ROS:   Please see the history of present illness.     All other systems reviewed and are negative.  EKGs/Labs/Other Studies Reviewed:   Cardiac Studies &  Procedures   CARDIAC CATHETERIZATION  CARDIAC CATHETERIZATION 04/04/2023  Narrative   Prox LAD to Mid LAD lesion is 70% stenosed.   Ost LAD to Prox LAD lesion is 50% stenosed.   Mid LAD lesion is 90% stenosed.   Ramus-1 lesion is 80% stenosed.   Ramus-2 lesion is 50% stenosed.   Ost Cx lesion is 40% stenosed.   Mid Cx lesion is 50% stenosed.   The left ventricular systolic function is normal.   LV end diastolic pressure is normal.   The left ventricular ejection fraction is 55-65% by visual estimate.   There is no aortic valve stenosis.   In the absence of any other complications or medical issues, we expect the patient to be ready for discharge from a cath perspective on 04/04/2023.  Multivessel coronary artery disease with the most severe disease being in the mid LAD and the ostial ramus branch.  There is another significant lesion in the proximal LAD.  The ostial to proximal LAD has diffuse, moderate disease.  There is moderate disease in the mid circumflex.  Cath works FFR was performed which showed that the circumflex disease was not significant. LAD FFR dropped to 0.72 in the mid vessel and 0.69 after all of the significant disease. The ramus FFR dropped to 0.83 after the initial lesion and to 0.77 in the mid vessel. Given the moderate LAD disease in the proximal vessel extends all the way back to the left main, landing the stent with strut hanging in the left main may further compromise the ramus vessel.  It would be a long area of stent.  Will obtain cardiac surgical consult for possible bypass. One option would be hybrid option with LIMA to LAD (would check with surgery if robotic option is available).  Consider medical therapy of the ramus and if symptoms persisted, could pursue PCI of the ostial ramus.  Since the LAD would be protected, this would be feasible.  There may be some shift into the circumflex vessel but this is a relatively small vessel.  He will need aggressive medical  therapy including aspirin and statin.  Findings Coronary Findings Diagnostic  Dominance: Right  Left Anterior Descending Ost LAD to Prox LAD lesion is 50% stenosed. Prox LAD to Mid LAD lesion is 70% stenosed. Mid LAD lesion is 90% stenosed.  Ramus Intermedius Vessel is large. Ramus-1 lesion is 80% stenosed. Ramus-2 lesion is 50% stenosed.  Left Circumflex Ost Cx lesion is 40% stenosed. Mid Cx lesion is 50% stenosed.  Right Coronary Artery There is mild diffuse disease throughout the vessel.  Intervention  No interventions have been documented.   STRESS TESTS  MYOCARDIAL PERFUSION IMAGING 06/15/2020  ECHOCARDIOGRAM  ECHOCARDIOGRAM COMPLETE 04/04/2023  Narrative ECHOCARDIOGRAM REPORT    Patient Name:   KOBIE BROTMAN Lowman Date of Exam: 04/04/2023 Medical Rec #:  [id number]             Height:       70.0 in Accession #:  [id number]            Weight:       218.0 lb Date of Birth:  1971/01/28              BSA:          2.165 m Patient Age:    52 years              BP:           125/85 mmHg Patient Gender: M                     HR:           61 bpm. Exam Location:  Inpatient  Procedure: 2D Echo, Color Doppler and Cardiac Doppler  Indications:    Pre-Op  History:        Patient has no prior history of Echocardiogram examinations. CAD, Arrythmias:Tachycardia; Risk Factors:Hypertension.  Sonographer:    Darlys Gales Referring Phys: [id number] Perlie Gold  IMPRESSIONS   1. Left ventricular ejection fraction, by estimation, is 60 to 65%. The left ventricle has normal function. The left ventricle has no regional wall motion abnormalities. There is mild asymmetric left ventricular hypertrophy of the basal-septal segment. Left ventricular diastolic parameters are indeterminate. 2. Right ventricular systolic function is normal. The right ventricular size is normal. Tricuspid regurgitation signal is inadequate for assessing PA pressure. 3. The mitral valve is normal in  structure. Trivial mitral valve regurgitation. 4. The aortic valve is tricuspid. Aortic valve regurgitation is not visualized. No aortic stenosis is present.  FINDINGS Left Ventricle: Left ventricular ejection fraction, by estimation, is 60 to 65%. The left ventricle has normal function. The left ventricle has no regional wall motion abnormalities. The left ventricular internal cavity size was normal in size. There is mild asymmetric left ventricular hypertrophy of the basal-septal segment. Left ventricular diastolic parameters are indeterminate.  Right Ventricle: The right ventricular size is normal. No increase in right ventricular wall thickness. Right ventricular systolic function is normal. Tricuspid regurgitation signal is inadequate for assessing PA pressure.  Left Atrium: Left atrial size was normal in size.  Right Atrium: Right atrial size was normal in size.  Pericardium: There is no evidence of pericardial effusion.  Mitral Valve: The mitral valve is normal in structure. Trivial mitral valve regurgitation.  Tricuspid Valve: The tricuspid valve is normal in structure. Tricuspid valve regurgitation is trivial.  Aortic Valve: The aortic valve is tricuspid. Aortic valve regurgitation is not visualized. No aortic stenosis is present. Aortic valve peak gradient measures 4.7 mmHg.  Pulmonic Valve: The pulmonic valve was not well visualized. Pulmonic valve regurgitation is not visualized.  Aorta: The aortic root and ascending aorta are structurally normal, with no evidence of dilitation.  IAS/Shunts: The interatrial septum was not well visualized.   LEFT VENTRICLE PLAX 2D LVIDd:         5.30 cm LVIDs:         3.40 cm LV PW:         1.10 cm LV IVS:        1.20 cm LVOT diam:     2.00 cm LVOT Area:     3.14 cm   RIGHT VENTRICLE RV S prime:     14.80 cm/s TAPSE (M-mode): 3.1 cm  LEFT ATRIUM           Index LA Vol (A2C): 20.3 ml 9.38 ml/m LA Vol (A4C): 61.8 ml 28.54  ml/m AORTIC VALVE AV Area (  Vmax): 2.48 cm AV Vmax:        108.00 cm/s AV Peak Grad:   4.7 mmHg LVOT Vmax:      85.40 cm/s  MITRAL VALVE MV Area (PHT): 3.50 cm     SHUNTS MV Decel Time: 217 msec     Systemic Diam: 2.00 cm MV E velocity: 79.70 cm/s MV A velocity: 104.00 cm/s MV E/A ratio:  0.77  [name] MD Electronically signed by [name] MD Signature Date/Time: 04/04/2023/1:48:20 PM    Final  TEE  ECHO INTRAOPERATIVE TEE 05/16/2023  Narrative *INTRAOPERATIVE TRANSESOPHAGEAL REPORT *    Patient Name:   ANDREAS SOUVA Azbell Date of Exam: 05/16/2023 Medical Rec #:  [id number]             Height:       70.0 in Accession #:    [id number]            Weight:       198.0 lb Date of Birth:  Nov 14, 1970              BSA:          2.08 m Patient Age:    52 years              BP:           153/110 mmHg Patient Gender: M                     HR:           72 bpm. Exam Location:  Anesthesiology  Transesophogeal exam was perform intraoperatively during surgical procedure. Patient was closely monitored under general anesthesia during the entirety of examination.  Indications:     CAD Native Vessel i25.10 Sonographer:     Irving Burton Senior RDCS Performing Phys: Leslye Peer MD Diagnosing Phys: Leslye Peer MD  Complications: No known complications during this procedure. POST-OP IMPRESSIONS _ Left Ventricle: The left ventricle is unchanged from pre-bypass. _ Right Ventricle: The right ventricle appears unchanged from pre-bypass. _ Aorta: The aorta appears unchanged from pre-bypass. _ Left Atrium: The left atrium appears unchanged from pre-bypass. _ Left Atrial Appendage: The left atrial appendage appears unchanged from pre-bypass. _ Aortic Valve: The aortic valve appears unchanged from pre-bypass. _ Mitral Valve: The mitral valve appears unchanged from pre-bypass. _ Tricuspid Valve: The tricuspid valve appears unchanged from pre-bypass. _ Pulmonic Valve: The  pulmonic valve appears unchanged from pre-bypass. _ Interatrial Septum: The interatrial septum appears unchanged from pre-bypass. _ Interventricular Septum: The interventricular septum appears unchanged from pre-bypass. _ Pericardium: The pericardium appears unchanged from pre-bypass.  PRE-OP FINDINGS Left Ventricle: The left ventricle has normal systolic function, with an ejection fraction of 55-60%. The cavity size was normal. There is mild concentric left ventricular hypertrophy of the basal-septal segment.   Right Ventricle: The right ventricle has normal systolic function. The cavity was normal. There is no increase in right ventricular wall thickness.  Left Atrium: Left atrial size was normal in size.  Right Atrium: Right atrial size was normal in size.  Interatrial Septum: No atrial level shunt detected by color flow Doppler.  Pericardium: There is no evidence of pericardial effusion.  Mitral Valve: The mitral valve is normal in structure. Mitral valve regurgitation is trivial by color flow Doppler. There is No evidence of mitral stenosis.  Tricuspid Valve: The tricuspid valve was normal in structure. Tricuspid valve regurgitation is trivial by color flow Doppler. No evidence of tricuspid stenosis is present.  Aortic Valve: The aortic valve is tricuspid Aortic valve regurgitation was not visualized by color flow Doppler. There is no stenosis of the aortic valve.   Pulmonic Valve: The pulmonic valve was not assessed. Pulmonic valve regurgitation was not assessed by color flow Doppler.    Leslye Peer MD Electronically signed by Leslye Peer MD Signature Date/Time: 05/16/2023/1:36:47 PM    Final  MONITORS  LONG TERM MONITOR (3-14 DAYS) 07/07/2020  Narrative The patient wore the monitor for 3 days 1 hour starting 06/26/2020. Indication: Palpitations  The minimum heart rate was 52 bpm, maximum heart rate was 196 bpm, and average heart rate was 83 bpm. Predominant  underlying rhythm was Sinus Rhythm.  2 Supraventricular Tachycardia runs occurred, the run with the fastest interval lasting 5 beats with a maximum rate of 171 bpm (average 146 bpm); the run with the fastest interval was also the longest.  Premature atrial complexes were rare. Premature Ventricular complexes were occasional (1.0%, 3730).  No ventricular tachycardia, no pauses, No AV block and no atrial fibrillation present. 3 patient triggered events all associated with premature ventricular complex. 5 diary events 3 associated with premature ventricular complex and the remaining with sinus rhythm.  Conclusion: This study is remarkable for the following: 1. Rare asymptomatic atrial tachycardia with variable block. 2. Occasional premature ventricular complexes.  CT SCANS  CT CORONARY MORPH W/CTA COR W/SCORE 03/29/2023  Addendum 04/05/2023  1:44 PM ADDENDUM REPORT: 04/05/2023 13:41  EXAM: OVER-READ INTERPRETATION  CT CHEST  The following report is an over-read performed by radiologist Dr. Elnoria Howard [facility] on 04/05/2023. This over-read does not include interpretation of cardiac or coronary anatomy or pathology. The cardiovascular interpretation by the cardiologist is attached.  COMPARISON:  11/09/2020  FINDINGS: No enlarged lymph nodes. The included lungs are clear with no nodules or pleural fluid seen. Thoracic spine degenerative changes. Unremarkable upper abdomen.  IMPRESSION: No significant extracardiac findings.   Electronically Signed By: Beckie Salts M.D. On: 04/05/2023 13:41  Narrative CLINICAL DATA:  52 yo male with chest pain  EXAM: Cardiac/Coronary CTA  TECHNIQUE: A non-contrast, gated CT scan was obtained with axial slices of 3 mm through the heart for calcium scoring. Calcium scoring was performed using the Agatston method. A 120 kV prospective, gated, contrast cardiac scan was obtained. Gantry rotation speed was 250 msecs  and collimation was 0.6 mm. Two sublingual nitroglycerin tablets (0.8 mg) were given. The 3D data set was reconstructed in 5% intervals of the 35-75% of the R-R cycle. Diastolic phases were analyzed on a dedicated workstation using MPR, MIP, and VRT modes. The patient received 95 cc of contrast.  FINDINGS: Image quality: Poor.  Noise artifact is: Moderate (misregistration; signal to noise).  Coronary Arteries:  Normal coronary origin.  Right dominance.  Left main: The left main is a large caliber vessel with a normal take off from the left coronary cusp that trifurcates into a LAD, LCX, and ramus intermedius. There is no plaque or stenosis.  Left anterior descending artery: The LAD has moderate (50-69) mixed plaque stenosis in the proximal vessel. The LAD gives off 2 small diagonal branches.  Ramus intermedius: Large, branching; moderate (50-69) stenosis in proximal, medial branch.  Left circumflex artery: The LCX is non-dominant and patent with severe (70-99) stenosis in the mid vessel. The LCX gives off 2 obtuse marginal branches not well visualized.  Right coronary artery: The RCA is dominant with normal take off from the right coronary cusp. There is no evidence of  plaque or stenosis. The RCA terminates as a PDA and right posterolateral branch without evidence of plaque or stenosis.  Right Atrium: Right atrial size is within normal limits.  Right Ventricle: The right ventricular cavity is within normal limits.  Left Atrium: Left atrial size is normal in size with no left atrial appendage filling defect.  Left Ventricle: The ventricular cavity size is within normal limits. There are no stigmata of prior infarction. There is no abnormal filling defect.  Pulmonary arteries: Normal in size without proximal filling defect.  Pulmonary veins: Normal pulmonary venous drainage.  Pericardium: Normal thickness with no significant effusion or calcium present.  Cardiac  valves: The aortic valve is trileaflet without significant calcification. The mitral valve is normal structure without significant calcification.  Aorta: Normal caliber with mild aortic atherosclerosis.  Extra-cardiac findings: See attached radiology report for non-cardiac structures.  IMPRESSION: 1. Coronary calcium score of 43.8. This was 40 percentile for age-, sex, and race-matched controls.  2. Normal coronary origin with right dominance.  3. Severe (70-99) stenosis in the LCX and moderate (50-69) stenoses in the LAD and branch of the ramus intermedius.  4. Mild aortic atherosclerosis.  5. Study will be sent for FFR.  RECOMMENDATIONS: CAD-RADS 4: Severe stenosis. (70-99% or > 50% left main). Cardiac catheterization or CT FFR is recommended. Consider symptom-guided anti-ischemic pharmacotherapy as well as risk factor modification per guideline directed care. Invasive coronary angiography recommended with revascularization per published guideline statements.  Olga Millers, MD  Electronically Signed: By: Olga Millers M.D. On: 03/29/2023 18:55   CT SCANS  CT CORONARY MORPH W/CTA COR W/SCORE 11/09/2020  Addendum 11/10/2020  4:04 PM ADDENDUM REPORT: 11/10/2020 16:02  CLINICAL DATA:  This is a 52 year old male with chest pain  EXAM: Cardiac/Coronary  CT  TECHNIQUE: The patient was scanned on a [employer].  FINDINGS: A 120 kV prospective scan was triggered in the descending thoracic aorta at 111 HU's. Axial non-contrast 3 mm slices were carried out through the heart. The data set was analyzed on a dedicated work station and scored using the Agatson method. Gantry rotation speed was 250 msecs and collimation was .6 mm. No beta blockade and 0.8 mg of sl NTG was given. The 3D data set was reconstructed in 5% intervals of the 67-82 % of the R-R cycle. Diastolic phases were analyzed on a dedicated work station using MPR, MIP and VRT modes. The patient  received 80 cc of contrast.  Aorta: Normal size.  No calcifications.  No dissection.  Aortic Valve:  Trileaflet.  No calcifications.  Coronary calcium score 8.6.  Coronary Arteries:  Normal coronary origin.  Right dominance.  RCA is a large dominant artery that gives rise to PDA and PLVB. There is no plaque.  Left main is a large artery that gives rise to LAD and LCX arteries.  LAD is a large vessel. There is a minimal calcified plaque in the mid LAD. The proximal and distal LAD.  LCX is a non-dominant artery that gives rise to one large OM1 branch. There is no plaque. There is a stair-step artifact in the proximal LAD.  Other findings:  Normal pulmonary vein drainage into the left atrium.  Normal left atrial appendage without a thrombus.  Normal size of the pulmonary artery.  IMPRESSION: 1. Coronary calcium score of 8.6. This was 76 percentile for age and sex matched control.  2. Normal coronary origin with right dominance.  3. Minimal CAD. CADRADS 1. Aggressive medical therapy  is recommended.  Thomasene Ripple, DO   Electronically Signed By: Thomasene Ripple DO On: 11/10/2020 16:02  Narrative EXAM: OVER-READ INTERPRETATION  CT CHEST  The following report is an over-read performed by radiologist Dr. Charlett Nose of [facility] on 11/09/2020. This over-read does not include interpretation of cardiac or coronary anatomy or pathology. The coronary CTA interpretation by the cardiologist is attached.  COMPARISON:  None.  FINDINGS: Vascular: Heart is normal size.  Aorta normal caliber.  Mediastinum/Nodes: No adenopathy  Lungs/Pleura: Visualized lungs clear.  No effusions.  Upper Abdomen: Imaging into the upper abdomen demonstrates no acute findings.  Musculoskeletal: Chest wall soft tissues are unremarkable. No acute bony abnormality.  IMPRESSION: No acute or significant extracardiac abnormality.  Electronically Signed: By: Charlett Nose M.D. On:  11/09/2020 19:22           Recent Labs: 05/14/2023: ALT 19 05/17/2023: Magnesium 2.3 06/19/2023: BUN 11; Creatinine, Ser 1.03; Hemoglobin 13.9; Platelets 393; Potassium 4.2; Sodium 141  Recent Lipid Panel    Component Value Date/Time   LDLDIRECT 99 04/03/2023 1614     Risk Assessment/Calculations:        Physical Exam:    VS:  There were no vitals taken for this visit.    Wt Readings from Last 3 Encounters:  07/05/23 186 lb (84.4 kg)  06/19/23 185 lb (83.9 kg)  05/21/23 197 lb 1.5 oz (89.4 kg)     GEN:  Well nourished, well developed in no acute distress HEENT: Normal NECK: No JVD; No carotid bruits LYMPHATICS: No lymphadenopathy CARDIAC: RRR, no murmurs, rubs, gallops RESPIRATORY:  Clear to auscultation without rales, wheezing or rhonchi  ABDOMEN: Soft, non-tender, non-distended MUSCULOSKELETAL:  No edema; No deformity  SKIN: Warm and dry NEUROLOGIC:  Alert and oriented x 3 PSYCHIATRIC:  Normal affect   ASSESSMENT:    No diagnosis found.    PLAN:    In order of problems listed above:  Coronary artery disease -s/p CABG x 3 on May 15, 2013 LIMA to LAD, SVG to ramus, SVG to diagonal.  He is recovering as expected.  Continues to use his incentive spirometer and adhere to sternal precautions. Small area of dehiscence at the top of his sternal incision ~ 1 cm, no drainage or erythema noted. He will see CVTS on 07/05/23, after that visit he will be released for cardiac rehab.  Continue aspirin 325 mg daily, continue Lipitor 80 mg daily, continue Zetia 10 mg daily, continue metoprolol 25 mg twice daily, continue nitroglycerin as needed--has not needed.  Paroxysmal atrial tachycardia/symptomatic PVCs-quiescent, continue metoprolol 25 mg twice daily. Hyperlipidemia -most recent LDL was elevated at 99, continue Lipitor 80 mg daily, continue Zetia 10 mg daily.  Repeat LDL today. 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  Continuing with aggressive risk  reduction per above. Weight loss-he has lost approximately 40 pounds since June, part of this was intentional as he made drastic lifestyle changes prior to his bypass surgery however, he has lost 15 pounds since bypass that was unintentional, states he just does not have much of appetite.  Will repeat CBC BMET today.  Encouraged him to increase his protein.   Disposition -follow-up with general cardiology in 3 months. Labs per above.       Medication Adjustments/Labs and Tests Ordered: Current medicines are reviewed at length with the patient today.  Concerns regarding medicines are outlined above.  No orders of the defined types were placed in this encounter.  No orders of  the defined types were placed in this encounter.   There are no Patient Instructions on file for this visit.   Signed, Flossie Dibble, NP  09/17/2023 10:50 AM    [facility]

## 2023-09-18 ENCOUNTER — Ambulatory Visit: Payer: BC Managed Care – PPO | Admitting: Cardiology

## 2023-09-18 DIAGNOSIS — I2510 Atherosclerotic heart disease of native coronary artery without angina pectoris: Secondary | ICD-10-CM

## 2023-09-18 DIAGNOSIS — R002 Palpitations: Secondary | ICD-10-CM

## 2023-09-18 DIAGNOSIS — E7841 Elevated Lipoprotein(a): Secondary | ICD-10-CM

## 2023-09-18 DIAGNOSIS — E785 Hyperlipidemia, unspecified: Secondary | ICD-10-CM

## 2023-10-01 DIAGNOSIS — Z951 Presence of aortocoronary bypass graft: Secondary | ICD-10-CM | POA: Diagnosis not present

## 2023-10-02 NOTE — Progress Notes (Signed)
 Cardiology Office Note:    Date:  10/04/2023   ID:  Scott Mcguire, DOB September 06, 1971, MRN [id number]  PCP:  Dawayne Kerney SQUIBB, MD   [facility] Providers Cardiologist:  Alean SAUNDERS Madireddy, MD     Referring MD: Dawayne Kerney SQUIBB, MD    History of Present Illness:    Scott Mcguire is a 52 y.o. male with a hx of coronary artery disease s/p CABG x 3 in 2024,  hypertension, migraines, paroxysmal atrial tachycardia, chronic prostatitis, hyperlipidemia, anxiety.  Coronary CTA in 2022 the calcium  score of 8.6, minimal CAD. 03/29/23 coronary CTA calcium  score 43.8, severe stenosis of the left circumflex and moderate stenosis of the LAD, FFR suggested flow-limiting stenosis in the LAD and left circumflex 04/04/2023 left heart cath multivessel coronary artery disease, consultation with CVTS for CABG 04/04/2023 echo EF 60 to 65%, mild LV hypertrophy of the basal septal segment, trivial MR 05/14/2023 carotid ultrasound near normal bilaterally with only minimal wall thickening 05/16/2023 CABG x 3  He was admitted on 05/16/2023 and underwent three-vessel bypass LIMA to LAD, SVG to ramus, SVG to diagonal.  He had an uncomplicated hospitalization and was discharged on postop day 5.  He had a telemedicine visit with Dr. Shyrl on 06/01/2023, was doing well at this time, plan to return in approximately 3 weeks for repeat chest x-ray and cardiac rehab clearance.  Evaluated by general cardiology on 06/19/2023, was doing well, he was frustrated by his lack of progress, he had lost 40 pounds intentionally.  He presents today for follow up of his CAD. He is doing well from a cardiac perspective. He is participating in cardiac rehab and returned to his usual duties at work. He is maintaining his weight loss. He denies chest pain, palpitations, dyspnea, pnd, orthopnea, n, v, dizziness, syncope, edema, weight gain, or early satiety.   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   Tachycardia     Past Surgical History:  Procedure Laterality Date   CORONARY ARTERY BYPASS GRAFT N/A 05/16/2023   Procedure: CORONARY ARTERY BYPASS GRAFTING (CABG) X3 USING LEFT INTERNAL MAMMARY ARTERY (LIMA) AND ENDOSCOPICALLY HARVESTED RIGHT GREATER SAPHENOUS VEIN;  Surgeon: Shyrl Linnie KIDD, MD;  Location: [facility] OR;  Service: Open Heart Surgery;  Laterality: N/A;   LEFT HEART CATH AND CORONARY ANGIOGRAPHY N/A 04/04/2023   Procedure: LEFT HEART CATH AND CORONARY ANGIOGRAPHY;  Surgeon: Dann Candyce RAMAN, MD;  Location: [facility] INVASIVE CV LAB;  Service: Cardiovascular;  Laterality: N/A;   NO PAST SURGERIES     TEE WITHOUT CARDIOVERSION N/A 05/16/2023   Procedure: TRANSESOPHAGEAL ECHOCARDIOGRAM;  Surgeon: Shyrl Linnie KIDD, MD;  Location: [facility] OR;  Service: Open Heart Surgery;  Laterality: N/A;    Current Medications: Current Meds  Medication Sig   acetaminophen  (TYLENOL ) 500 MG tablet Take 1,000 mg by mouth every 6 (six) hours as needed (pain/headaches.).   ALPRAZolam  (XANAX ) 1 MG tablet Take 1 mg by mouth at bedtime.   aspirin  EC 325 MG tablet Take 1 tablet (325 mg total) by mouth daily.   atorvastatin  (LIPITOR ) 80 MG tablet Take 80 mg by mouth in the morning.   cetirizine (ZYRTEC) 10 MG tablet  Take 10 mg by mouth daily as needed for allergies.   diltiazem  (CARDIZEM  CD) 240 MG 24 hr capsule Take 1 capsule (240 mg total) by mouth daily.   ezetimibe  (ZETIA ) 10 MG tablet Take 1 tablet (10 mg total) by mouth at bedtime.   guaiFENesin  (MUCINEX ) 600 MG 12 hr tablet Take 1 tablet (600 mg total) by mouth 2 (two) times daily.   ketotifen  (ALAWAY ) 0.035 % ophthalmic solution Place 1 drop into both eyes 2 (two) times  daily as needed (allergy eyes/irritation.).   metoprolol  tartrate (LOPRESSOR ) 25 MG tablet Take 1 tablet (25 mg total) by mouth 2 (two) times daily.   nitroGLYCERIN  (NITROSTAT ) 0.4 MG SL tablet Place 1 tablet (0.4 mg total) under the tongue every 5 (five) minutes as needed for chest pain.   omeprazole (PRILOSEC) 40 MG capsule Take 40 mg by mouth daily before breakfast.     Allergies:   Ceftin [cefuroxime]   Social History   Socioeconomic History   Marital status: Married    Spouse name: Not on file   Number of children: Not on file   Years of education: Not on file   Highest education level: Not on file  Occupational History   Not on file  Tobacco Use   Smoking status: Never   Smokeless tobacco: Never  Substance and Sexual Activity   Alcohol use: Never   Drug use: Never   Sexual activity: Not on file  Other Topics Concern   Not on file  Social History Narrative   Not on file   Social Drivers of Health   Financial Resource Strain: Not on file  Food Insecurity: Not on file  Transportation Needs: Not on file  Physical Activity: Not on file  Stress: Not on file  Social Connections: Not on file     Family History: The patient's family history includes Heart disease in his father and mother.  ROS:   Please see the history of present illness.     All other systems reviewed and are negative.  EKGs/Labs/Other Studies Reviewed:   Cardiac Studies & Procedures   CARDIAC CATHETERIZATION  CARDIAC CATHETERIZATION 04/04/2023  Narrative   Prox LAD to Mid LAD lesion is 70% stenosed.   Ost LAD to Prox LAD lesion is 50% stenosed.   Mid LAD lesion is 90% stenosed.   Ramus-1 lesion is 80% stenosed.   Ramus-2 lesion is 50% stenosed.   Ost Cx lesion is 40% stenosed.   Mid Cx lesion is 50% stenosed.   The left ventricular systolic function is normal.   LV end diastolic pressure is normal.   The left ventricular ejection fraction is 55-65% by visual estimate.   There is no aortic  valve stenosis.   In the absence of any other complications or medical issues, we expect the patient to be ready for discharge from a cath perspective on 04/04/2023.  Multivessel coronary artery disease with the most severe disease being in the mid LAD and the ostial ramus branch.  There is another significant lesion in the proximal LAD.  The ostial to proximal LAD has diffuse, moderate disease.  There is moderate disease in the mid circumflex.  Cath works FFR was performed which showed that the circumflex disease was not significant. LAD FFR dropped to 0.72 in the mid vessel and 0.69 after all of the significant disease. The ramus FFR dropped to 0.83 after the initial lesion and to 0.77 in the mid vessel. Given the moderate LAD disease in the proximal  vessel extends all the way back to the left main, landing the stent with strut hanging in the left main may further compromise the ramus vessel.  It would be a long area of stent.  Will obtain cardiac surgical consult for possible bypass. One option would be hybrid option with LIMA to LAD (would check with surgery if robotic option is available).  Consider medical therapy of the ramus and if symptoms persisted, could pursue PCI of the ostial ramus.  Since the LAD would be protected, this would be feasible.  There may be some shift into the circumflex vessel but this is a relatively small vessel.  He will need aggressive medical therapy including aspirin  and statin.  Findings Coronary Findings Diagnostic  Dominance: Right  Left Anterior Descending Ost LAD to Prox LAD lesion is 50% stenosed. Prox LAD to Mid LAD lesion is 70% stenosed. Mid LAD lesion is 90% stenosed.  Ramus Intermedius Vessel is large. Ramus-1 lesion is 80% stenosed. Ramus-2 lesion is 50% stenosed.  Left Circumflex Ost Cx lesion is 40% stenosed. Mid Cx lesion is 50% stenosed.  Right Coronary Artery There is mild diffuse disease throughout the vessel.  Intervention  No  interventions have been documented.   STRESS TESTS  MYOCARDIAL PERFUSION IMAGING 06/15/2020  ECHOCARDIOGRAM  ECHOCARDIOGRAM COMPLETE 04/04/2023  Narrative ECHOCARDIOGRAM REPORT    Patient Name:   Scott Mcguire Date of Exam: 04/04/2023 Medical Rec #:  [id number]             Height:       70.0 in Accession #:    [id number]            Weight:       218.0 lb Date of Birth:  Apr 27, 1971              BSA:          2.165 m Patient Age:    52 years              BP:           125/85 mmHg Patient Gender: M                     HR:           61 bpm. Exam Location:  Inpatient  Procedure: 2D Echo, Color Doppler and Cardiac Doppler  Indications:    Pre-Op  History:        Patient has no prior history of Echocardiogram examinations. CAD, Arrythmias:Tachycardia; Risk Factors:Hypertension.  Sonographer:    Jayson Gaskins Referring Phys: [id number] ARTIST POUCH  IMPRESSIONS   1. Left ventricular ejection fraction, by estimation, is 60 to 65%. The left ventricle has normal function. The left ventricle has no regional wall motion abnormalities. There is mild asymmetric left ventricular hypertrophy of the basal-septal segment. Left ventricular diastolic parameters are indeterminate. 2. Right ventricular systolic function is normal. The right ventricular size is normal. Tricuspid regurgitation signal is inadequate for assessing PA pressure. 3. The mitral valve is normal in structure. Trivial mitral valve regurgitation. 4. The aortic valve is tricuspid. Aortic valve regurgitation is not visualized. No aortic stenosis is present.  FINDINGS Left Ventricle: Left ventricular ejection fraction, by estimation, is 60 to 65%. The left ventricle has normal function. The left ventricle has no regional wall motion abnormalities. The left ventricular internal cavity size was normal in size. There is mild asymmetric left ventricular hypertrophy of the basal-septal segment. Left ventricular diastolic parameters are  indeterminate.  Right Ventricle: The right ventricular size is normal. No increase in right ventricular wall thickness. Right ventricular systolic function is normal. Tricuspid regurgitation signal is inadequate for assessing PA pressure.  Left Atrium: Left atrial size was normal in size.  Right Atrium: Right atrial size was normal in size.  Pericardium: There is no evidence of pericardial effusion.  Mitral Valve: The mitral valve is normal in structure. Trivial mitral valve regurgitation.  Tricuspid Valve: The tricuspid valve is normal in structure. Tricuspid valve regurgitation is trivial.  Aortic Valve: The aortic valve is tricuspid. Aortic valve regurgitation is not visualized. No aortic stenosis is present. Aortic valve peak gradient measures 4.7 mmHg.  Pulmonic Valve: The pulmonic valve was not well visualized. Pulmonic valve regurgitation is not visualized.  Aorta: The aortic root and ascending aorta are structurally normal, with no evidence of dilitation.  IAS/Shunts: The interatrial septum was not well visualized.   LEFT VENTRICLE PLAX 2D LVIDd:         5.30 cm LVIDs:         3.40 cm LV PW:         1.10 cm LV IVS:        1.20 cm LVOT diam:     2.00 cm LVOT Area:     3.14 cm   RIGHT VENTRICLE RV S prime:     14.80 cm/s TAPSE (M-mode): 3.1 cm  LEFT ATRIUM           Index LA Vol (A2C): 20.3 ml 9.38 ml/m LA Vol (A4C): 61.8 ml 28.54 ml/m AORTIC VALVE AV Area (Vmax): 2.48 cm AV Vmax:        108.00 cm/s AV Peak Grad:   4.7 mmHg LVOT Vmax:      85.40 cm/s  MITRAL VALVE MV Area (PHT): 3.50 cm     SHUNTS MV Decel Time: 217 msec     Systemic Diam: 2.00 cm MV E velocity: 79.70 cm/s MV A velocity: 104.00 cm/s MV E/A ratio:  0.77  Lonni Nanas MD Electronically signed by Lonni Nanas MD Signature Date/Time: 04/04/2023/1:48:20 PM    Final  TEE  ECHO INTRAOPERATIVE TEE 05/16/2023  Narrative *INTRAOPERATIVE TRANSESOPHAGEAL REPORT  *    Patient Name:   TIYON SANOR Vaux Date of Exam: 05/16/2023 Medical Rec #:  [id number]             Height:       70.0 in Accession #:    [id number]            Weight:       198.0 lb Date of Birth:  [birth year]              BSA:          2.08 m Patient Age:    52 years              BP:           153/110 mmHg Patient Gender: M                     HR:           72 bpm. Exam Location:  Anesthesiology  Transesophogeal exam was perform intraoperatively during surgical procedure. Patient was closely monitored under general anesthesia during the entirety of examination.  Indications:     CAD Native Vessel i25.10 Sonographer:     Damien Senior RDCS Performing Phys: Debby Like MD Diagnosing Phys: Debby Like MD  Complications: No known complications during this procedure.  POST-OP IMPRESSIONS _ Left Ventricle: The left ventricle is unchanged from pre-bypass. _ Right Ventricle: The right ventricle appears unchanged from pre-bypass. _ Aorta: The aorta appears unchanged from pre-bypass. _ Left Atrium: The left atrium appears unchanged from pre-bypass. _ Left Atrial Appendage: The left atrial appendage appears unchanged from pre-bypass. _ Aortic Valve: The aortic valve appears unchanged from pre-bypass. _ Mitral Valve: The mitral valve appears unchanged from pre-bypass. _ Tricuspid Valve: The tricuspid valve appears unchanged from pre-bypass. _ Pulmonic Valve: The pulmonic valve appears unchanged from pre-bypass. _ Interatrial Septum: The interatrial septum appears unchanged from pre-bypass. _ Interventricular Septum: The interventricular septum appears unchanged from pre-bypass. _ Pericardium: The pericardium appears unchanged from pre-bypass.  PRE-OP FINDINGS Left Ventricle: The left ventricle has normal systolic function, with an ejection fraction of 55-60%. The cavity size was normal. There is mild concentric left ventricular hypertrophy of the basal-septal segment.   Right  Ventricle: The right ventricle has normal systolic function. The cavity was normal. There is no increase in right ventricular wall thickness.  Left Atrium: Left atrial size was normal in size.  Right Atrium: Right atrial size was normal in size.  Interatrial Septum: No atrial level shunt detected by color flow Doppler.  Pericardium: There is no evidence of pericardial effusion.  Mitral Valve: The mitral valve is normal in structure. Mitral valve regurgitation is trivial by color flow Doppler. There is No evidence of mitral stenosis.  Tricuspid Valve: The tricuspid valve was normal in structure. Tricuspid valve regurgitation is trivial by color flow Doppler. No evidence of tricuspid stenosis is present.  Aortic Valve: The aortic valve is tricuspid Aortic valve regurgitation was not visualized by color flow Doppler. There is no stenosis of the aortic valve.   Pulmonic Valve: The pulmonic valve was not assessed. Pulmonic valve regurgitation was not assessed by color flow Doppler.    Debby Like MD Electronically signed by Debby Like MD Signature Date/Time: 05/16/2023/1:36:47 PM    Final  MONITORS  LONG TERM MONITOR (3-14 DAYS) 07/07/2020  Narrative The patient wore the monitor for 3 days 1 hour starting 06/26/2020. Indication: Palpitations  The minimum heart rate was 52 bpm, maximum heart rate was 196 bpm, and average heart rate was 83 bpm. Predominant underlying rhythm was Sinus Rhythm.  2 Supraventricular Tachycardia runs occurred, the run with the fastest interval lasting 5 beats with a maximum rate of 171 bpm (average 146 bpm); the run with the fastest interval was also the longest.  Premature atrial complexes were rare. Premature Ventricular complexes were occasional (1.0%, 3730).  No ventricular tachycardia, no pauses, No AV block and no atrial fibrillation present. 3 patient triggered events all associated with premature ventricular complex. 5 diary events 3  associated with premature ventricular complex and the remaining with sinus rhythm.  Conclusion: This study is remarkable for the following: 1. Rare asymptomatic atrial tachycardia with variable block. 2. Occasional premature ventricular complexes.  CT SCANS  CT CORONARY MORPH W/CTA COR W/SCORE 03/29/2023  Addendum 04/05/2023  1:44 PM ADDENDUM REPORT: 04/05/2023 13:41  EXAM: OVER-READ INTERPRETATION  CT CHEST  The following report is an over-read performed by radiologist Dr. Elspeth Dada [facility] on 04/05/2023. This over-read does not include interpretation of cardiac or coronary anatomy or pathology. The cardiovascular interpretation by the cardiologist is attached.  COMPARISON:  11/09/2020  FINDINGS: No enlarged lymph nodes. The included lungs are clear with no nodules or pleural fluid seen. Thoracic spine degenerative changes. Unremarkable upper abdomen.  IMPRESSION: No significant  extracardiac findings.   Electronically Signed By: Elspeth Bathe M.D. On: 04/05/2023 13:41  Narrative CLINICAL DATA:  52 yo male with chest pain  EXAM: Cardiac/Coronary CTA  TECHNIQUE: A non-contrast, gated CT scan was obtained with axial slices of 3 mm through the heart for calcium  scoring. Calcium  scoring was performed using the Agatston method. A 120 kV prospective, gated, contrast cardiac scan was obtained. Gantry rotation speed was 250 msecs and collimation was 0.6 mm. Two sublingual nitroglycerin  tablets (0.8 mg) were given. The 3D data set was reconstructed in 5% intervals of the 35-75% of the R-R cycle. Diastolic phases were analyzed on a dedicated workstation using MPR, MIP, and VRT modes. The patient received 95 cc of contrast.  FINDINGS: Image quality: Poor.  Noise artifact is: Moderate (misregistration; signal to noise).  Coronary Arteries:  Normal coronary origin.  Right dominance.  Left main: The left main is a large caliber vessel with a normal take  off from the left coronary cusp that trifurcates into a LAD, LCX, and ramus intermedius. There is no plaque or stenosis.  Left anterior descending artery: The LAD has moderate (50-69) mixed plaque stenosis in the proximal vessel. The LAD gives off 2 small diagonal branches.  Ramus intermedius: Large, branching; moderate (50-69) stenosis in proximal, medial branch.  Left circumflex artery: The LCX is non-dominant and patent with severe (70-99) stenosis in the mid vessel. The LCX gives off 2 obtuse marginal branches not well visualized.  Right coronary artery: The RCA is dominant with normal take off from the right coronary cusp. There is no evidence of plaque or stenosis. The RCA terminates as a PDA and right posterolateral branch without evidence of plaque or stenosis.  Right Atrium: Right atrial size is within normal limits.  Right Ventricle: The right ventricular cavity is within normal limits.  Left Atrium: Left atrial size is normal in size with no left atrial appendage filling defect.  Left Ventricle: The ventricular cavity size is within normal limits. There are no stigmata of prior infarction. There is no abnormal filling defect.  Pulmonary arteries: Normal in size without proximal filling defect.  Pulmonary veins: Normal pulmonary venous drainage.  Pericardium: Normal thickness with no significant effusion or calcium  present.  Cardiac valves: The aortic valve is trileaflet without significant calcification. The mitral valve is normal structure without significant calcification.  Aorta: Normal caliber with mild aortic atherosclerosis.  Extra-cardiac findings: See attached radiology report for non-cardiac structures.  IMPRESSION: 1. Coronary calcium  score of 43.8. This was 98 percentile for age-, sex, and race-matched controls.  2. Normal coronary origin with right dominance.  3. Severe (70-99) stenosis in the LCX and moderate (50-69) stenoses in the LAD  and branch of the ramus intermedius.  4. Mild aortic atherosclerosis.  5. Study will be sent for FFR.  RECOMMENDATIONS: CAD-RADS 4: Severe stenosis. (70-99% or > 50% left main). Cardiac catheterization or CT FFR is recommended. Consider symptom-guided anti-ischemic pharmacotherapy as well as risk factor modification per guideline directed care. Invasive coronary angiography recommended with revascularization per published guideline statements.  Redell Shallow, MD  Electronically Signed: By: Redell Shallow M.D. On: 03/29/2023 18:55   CT SCANS  CT CORONARY MORPH W/CTA COR W/SCORE 11/09/2020  Addendum 11/10/2020  4:04 PM ADDENDUM REPORT: 11/10/2020 16:02  CLINICAL DATA:  This is a 52 year old male with chest pain  EXAM: Cardiac/Coronary  CT  TECHNIQUE: The patient was scanned on a [employer].  FINDINGS: A 120 kV prospective scan was triggered  in the descending thoracic aorta at 111 HU's. Axial non-contrast 3 mm slices were carried out through the heart. The data set was analyzed on a dedicated work station and scored using the Agatson method. Gantry rotation speed was 250 msecs and collimation was .6 mm. No beta blockade and 0.8 mg of sl NTG was given. The 3D data set was reconstructed in 5% intervals of the 67-82 % of the R-R cycle. Diastolic phases were analyzed on a dedicated work station using MPR, MIP and VRT modes. The patient received 80 cc of contrast.  Aorta: Normal size.  No calcifications.  No dissection.  Aortic Valve:  Trileaflet.  No calcifications.  Coronary calcium  score 8.6.  Coronary Arteries:  Normal coronary origin.  Right dominance.  RCA is a large dominant artery that gives rise to PDA and PLVB. There is no plaque.  Left main is a large artery that gives rise to LAD and LCX arteries.  LAD is a large vessel. There is a minimal calcified plaque in the mid LAD. The proximal and distal LAD.  LCX is a non-dominant artery that gives  rise to one large OM1 branch. There is no plaque. There is a stair-step artifact in the proximal LAD.  Other findings:  Normal pulmonary vein drainage into the left atrium.  Normal left atrial appendage without a thrombus.  Normal size of the pulmonary artery.  IMPRESSION: 1. Coronary calcium  score of 8.6. This was 27 percentile for age and sex matched control.  2. Normal coronary origin with right dominance.  3. Minimal CAD. CADRADS 1. Aggressive medical therapy is recommended.  Kardie Tobb, DO   Electronically Signed By: Kardie  Tobb DO On: 11/10/2020 16:02  Narrative EXAM: OVER-READ INTERPRETATION  CT CHEST  The following report is an over-read performed by radiologist Dr. Franky Crease of [facility] on 11/09/2020. This over-read does not include interpretation of cardiac or coronary anatomy or pathology. The coronary CTA interpretation by the cardiologist is attached.  COMPARISON:  None.  FINDINGS: Vascular: Heart is normal size.  Aorta normal caliber.  Mediastinum/Nodes: No adenopathy  Lungs/Pleura: Visualized lungs clear.  No effusions.  Upper Abdomen: Imaging into the upper abdomen demonstrates no acute findings.  Musculoskeletal: Chest wall soft tissues are unremarkable. No acute bony abnormality.  IMPRESSION: No acute or significant extracardiac abnormality.  Electronically Signed: By: Franky Crease M.D. On: 11/09/2020 19:22           Recent Labs: 05/14/2023: ALT 19 05/17/2023: Magnesium  2.3 06/19/2023: BUN 11; Creatinine, Ser 1.03; Hemoglobin 13.9; Platelets 393; Potassium 4.2; Sodium 141  Recent Lipid Panel    Component Value Date/Time   LDLDIRECT 99 04/03/2023 1614     Risk Assessment/Calculations:        Physical Exam:    VS:  BP 123/85 (BP Location: Right Arm, Patient Position: Sitting, Cuff Size: Normal)   Pulse (!) 56   Ht 5' 10 (1.778 m)   Wt 190 lb (86.2 kg)   SpO2 98%   BMI 27.26 kg/m     Wt Readings  from Last 3 Encounters:  10/04/23 190 lb (86.2 kg)  07/05/23 186 lb (84.4 kg)  06/19/23 185 lb (83.9 kg)     GEN:  Well nourished, well developed in no acute distress HEENT: Normal NECK: No JVD; No carotid bruits LYMPHATICS: No lymphadenopathy CARDIAC: RRR, no murmurs, rubs, gallops RESPIRATORY:  Clear to auscultation without rales, wheezing or rhonchi  ABDOMEN: Soft, non-tender, non-distended MUSCULOSKELETAL:  No edema; No deformity  SKIN: Warm and dry NEUROLOGIC:  Alert and oriented x 3 PSYCHIATRIC:  Normal affect   ASSESSMENT:    1. Coronary artery disease involving native coronary artery of native heart without angina pectoris   2. S/P CABG x 3   3. Palpitations   4. Mixed hyperlipidemia   5. Elevated lipoprotein(a)       PLAN:    In order of problems listed above:  Coronary artery disease -s/p CABG x 3 on May 16, 2023 LIMA to LAD, SVG to ramus, SVG to diagonal.  He is recovering as expected.  Doing well from a cardiac perspective, is participating in cardiac rehab 2 to 3 days a week.  Stable with no anginal symptoms. No indication for ischemic evaluation. He has maintained his weight loss, eating appropriately.  Continue aspirin , Lipitor , Zetia , metoprolol , nitroglycerin  as needed. Paroxysmal atrial tachycardia/symptomatic PVCs-quiescent, continue metoprolol  25 mg twice daily. Hyperlipidemia -most recent LDL was elevated at 99, continue Lipitor  80 mg daily, continue Zetia  10 mg daily. Will repeat FLP and LFTs next week.  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  Continuing with aggressive risk reduction per above.   Disposition -follow-up with general cardiology in 6 months, repeat FLP and LFTs next week.       Medication Adjustments/Labs and Tests Ordered: Current medicines are reviewed at length with the patient today.  Concerns regarding medicines are outlined above.  Orders Placed This Encounter  Procedures   Lipid panel   Comprehensive  metabolic panel   No orders of the defined types were placed in this encounter.   Patient Instructions  Medication Instructions:  Your physician recommends that you continue on your current medications as directed. Please refer to the Current Medication list given to you today.  *If you need a refill on your cardiac medications before your next appointment, please call your pharmacy*   Lab Work: You will have labs drawn in one week. You will have a fasting lipid panel and a CMP If you have labs (blood work) drawn today and your tests are completely normal, you will receive your results only by: MyChart Message (if you have MyChart) OR A paper copy in the mail If you have any lab test that is abnormal or we need to change your treatment, we will call you to review the results.   Testing/Procedures: None Ordered   Follow-Up: At [facility], you and your health needs are our priority.  As part of our continuing mission to provide you with exceptional heart care, we have created designated Provider Care Teams.  These Care Teams include your primary Cardiologist (physician) and Advanced Practice Providers (APPs -  Physician Assistants and Nurse Practitioners) who all work together to provide you with the care you need, when you need it.  We recommend signing up for the patient portal called MyChart.  Sign up information is provided on this After Visit Summary.  MyChart is used to connect with patients for Virtual Visits (Telemedicine).  Patients are able to view lab/test results, encounter notes, upcoming appointments, etc.  Non-urgent messages can be sent to your provider as well.   To learn more about what you can do with MyChart, go to [email or web address].    Your next appointment:   6 month follow up   Signed, Delon JAYSON Hoover, NP  10/04/2023 12:58 PM    [facility]

## 2023-10-04 ENCOUNTER — Encounter: Payer: Self-pay | Admitting: Cardiology

## 2023-10-04 ENCOUNTER — Ambulatory Visit: Payer: BC Managed Care – PPO | Attending: Cardiology | Admitting: Cardiology

## 2023-10-04 VITALS — BP 123/85 | HR 56 | Ht 70.0 in | Wt 190.0 lb

## 2023-10-04 DIAGNOSIS — R002 Palpitations: Secondary | ICD-10-CM

## 2023-10-04 DIAGNOSIS — I2510 Atherosclerotic heart disease of native coronary artery without angina pectoris: Secondary | ICD-10-CM

## 2023-10-04 DIAGNOSIS — E782 Mixed hyperlipidemia: Secondary | ICD-10-CM | POA: Diagnosis not present

## 2023-10-04 DIAGNOSIS — E7841 Elevated Lipoprotein(a): Secondary | ICD-10-CM

## 2023-10-04 DIAGNOSIS — Z951 Presence of aortocoronary bypass graft: Secondary | ICD-10-CM | POA: Diagnosis not present

## 2023-10-04 NOTE — Patient Instructions (Addendum)
 Medication Instructions:  Your physician recommends that you continue on your current medications as directed. Please refer to the Current Medication list given to you today.  *If you need a refill on your cardiac medications before your next appointment, please call your pharmacy*   Lab Work: You will have labs drawn in one week. You will have a fasting lipid panel and a CMP If you have labs (blood work) drawn today and your tests are completely normal, you will receive your results only by: MyChart Message (if you have MyChart) OR A paper copy in the mail If you have any lab test that is abnormal or we need to change your treatment, we will call you to review the results.   Testing/Procedures: None Ordered   Follow-Up: At [facility], you and your health needs are our priority.  As part of our continuing mission to provide you with exceptional heart care, we have created designated Provider Care Teams.  These Care Teams include your primary Cardiologist (physician) and Advanced Practice Providers (APPs -  Physician Assistants and Nurse Practitioners) who all work together to provide you with the care you need, when you need it.  We recommend signing up for the patient portal called MyChart.  Sign up information is provided on this After Visit Summary.  MyChart is used to connect with patients for Virtual Visits (Telemedicine).  Patients are able to view lab/test results, encounter notes, upcoming appointments, etc.  Non-urgent messages can be sent to your provider as well.   To learn more about what you can do with MyChart, go to [email or web address].    Your next appointment:   6 month follow up

## 2023-10-15 DIAGNOSIS — Z951 Presence of aortocoronary bypass graft: Secondary | ICD-10-CM | POA: Diagnosis not present

## 2023-10-18 ENCOUNTER — Telehealth: Payer: Self-pay | Admitting: *Deleted

## 2023-10-18 MED ORDER — METOPROLOL TARTRATE 25 MG PO TABS: 25.0000 mg | ORAL_TABLET | Freq: Two times a day (BID) | ORAL | 11 refills | Status: AC

## 2023-10-18 NOTE — Telephone Encounter (Signed)
Rx refill sent to pharmacy. 

## 2023-10-19 DIAGNOSIS — Z951 Presence of aortocoronary bypass graft: Secondary | ICD-10-CM | POA: Diagnosis not present

## 2023-10-29 DIAGNOSIS — Z951 Presence of aortocoronary bypass graft: Secondary | ICD-10-CM | POA: Diagnosis not present

## 2023-10-31 ENCOUNTER — Other Ambulatory Visit: Payer: Self-pay

## 2023-10-31 MED ORDER — DILTIAZEM HCL ER COATED BEADS 240 MG PO CP24: 240.0000 mg | ORAL_CAPSULE | Freq: Every day | ORAL | 3 refills | Status: AC

## 2023-11-06 ENCOUNTER — Telehealth: Payer: Self-pay

## 2023-11-06 NOTE — Telephone Encounter (Signed)
Pt requesting cb to discuss if it is ok for him to travel. Fying out to [place] in the morning. Feeling ok just wants to make sure as far traveling

## 2023-11-06 NOTE — Telephone Encounter (Signed)
Recommendations reviewed with pt as per [facility] note.  Pt verbalized understanding and had no additional questions.

## 2023-11-12 DIAGNOSIS — Z951 Presence of aortocoronary bypass graft: Secondary | ICD-10-CM | POA: Diagnosis not present

## 2023-11-14 DIAGNOSIS — Z951 Presence of aortocoronary bypass graft: Secondary | ICD-10-CM | POA: Diagnosis not present

## 2023-11-16 DIAGNOSIS — Z951 Presence of aortocoronary bypass graft: Secondary | ICD-10-CM | POA: Diagnosis not present

## 2023-11-19 DIAGNOSIS — Z951 Presence of aortocoronary bypass graft: Secondary | ICD-10-CM | POA: Diagnosis not present

## 2023-11-26 DIAGNOSIS — Z951 Presence of aortocoronary bypass graft: Secondary | ICD-10-CM | POA: Diagnosis not present

## 2023-11-28 DIAGNOSIS — Z951 Presence of aortocoronary bypass graft: Secondary | ICD-10-CM | POA: Diagnosis not present

## 2023-12-03 DIAGNOSIS — Z951 Presence of aortocoronary bypass graft: Secondary | ICD-10-CM | POA: Diagnosis not present

## 2023-12-05 DIAGNOSIS — Z951 Presence of aortocoronary bypass graft: Secondary | ICD-10-CM | POA: Diagnosis not present

## 2023-12-07 DIAGNOSIS — Z951 Presence of aortocoronary bypass graft: Secondary | ICD-10-CM | POA: Diagnosis not present

## 2023-12-17 DIAGNOSIS — M25521 Pain in right elbow: Secondary | ICD-10-CM | POA: Diagnosis not present

## 2023-12-19 DIAGNOSIS — Z951 Presence of aortocoronary bypass graft: Secondary | ICD-10-CM | POA: Diagnosis not present

## 2023-12-28 DIAGNOSIS — Z951 Presence of aortocoronary bypass graft: Secondary | ICD-10-CM | POA: Diagnosis not present

## 2024-01-02 DIAGNOSIS — Z951 Presence of aortocoronary bypass graft: Secondary | ICD-10-CM | POA: Diagnosis not present

## 2024-01-07 DIAGNOSIS — Z951 Presence of aortocoronary bypass graft: Secondary | ICD-10-CM | POA: Diagnosis not present

## 2024-01-14 DIAGNOSIS — Z951 Presence of aortocoronary bypass graft: Secondary | ICD-10-CM | POA: Diagnosis not present

## 2024-01-17 DIAGNOSIS — Z951 Presence of aortocoronary bypass graft: Secondary | ICD-10-CM | POA: Diagnosis not present

## 2024-01-23 DIAGNOSIS — Z951 Presence of aortocoronary bypass graft: Secondary | ICD-10-CM | POA: Diagnosis not present

## 2024-01-30 DIAGNOSIS — Z951 Presence of aortocoronary bypass graft: Secondary | ICD-10-CM | POA: Diagnosis not present

## 2024-02-01 DIAGNOSIS — Z951 Presence of aortocoronary bypass graft: Secondary | ICD-10-CM | POA: Diagnosis not present

## 2024-02-11 DIAGNOSIS — Z951 Presence of aortocoronary bypass graft: Secondary | ICD-10-CM | POA: Diagnosis not present

## 2024-02-13 DIAGNOSIS — Z951 Presence of aortocoronary bypass graft: Secondary | ICD-10-CM | POA: Diagnosis not present

## 2024-02-26 DIAGNOSIS — Z951 Presence of aortocoronary bypass graft: Secondary | ICD-10-CM | POA: Diagnosis not present

## 2024-02-28 DIAGNOSIS — I10 Essential (primary) hypertension: Secondary | ICD-10-CM | POA: Diagnosis not present

## 2024-02-28 DIAGNOSIS — Z79899 Other long term (current) drug therapy: Secondary | ICD-10-CM | POA: Diagnosis not present

## 2024-02-28 DIAGNOSIS — K219 Gastro-esophageal reflux disease without esophagitis: Secondary | ICD-10-CM | POA: Diagnosis not present

## 2024-02-28 DIAGNOSIS — Z0000 Encounter for general adult medical examination without abnormal findings: Secondary | ICD-10-CM | POA: Diagnosis not present

## 2024-02-28 DIAGNOSIS — E038 Other specified hypothyroidism: Secondary | ICD-10-CM | POA: Diagnosis not present

## 2024-02-28 DIAGNOSIS — E559 Vitamin D deficiency, unspecified: Secondary | ICD-10-CM | POA: Diagnosis not present

## 2024-02-28 DIAGNOSIS — Z125 Encounter for screening for malignant neoplasm of prostate: Secondary | ICD-10-CM | POA: Diagnosis not present

## 2024-02-28 DIAGNOSIS — E782 Mixed hyperlipidemia: Secondary | ICD-10-CM | POA: Diagnosis not present

## 2024-02-28 DIAGNOSIS — R7309 Other abnormal glucose: Secondary | ICD-10-CM | POA: Diagnosis not present

## 2024-02-29 LAB — LAB REPORT - SCANNED
A1c: 5.4
EGFR: 79

## 2024-03-03 DIAGNOSIS — Z951 Presence of aortocoronary bypass graft: Secondary | ICD-10-CM | POA: Diagnosis not present

## 2024-03-07 DIAGNOSIS — Z951 Presence of aortocoronary bypass graft: Secondary | ICD-10-CM | POA: Diagnosis not present

## 2024-04-23 DIAGNOSIS — D485 Neoplasm of uncertain behavior of skin: Secondary | ICD-10-CM | POA: Diagnosis not present

## 2024-04-23 DIAGNOSIS — L814 Other melanin hyperpigmentation: Secondary | ICD-10-CM | POA: Diagnosis not present

## 2024-04-23 DIAGNOSIS — L578 Other skin changes due to chronic exposure to nonionizing radiation: Secondary | ICD-10-CM | POA: Diagnosis not present

## 2024-04-23 DIAGNOSIS — D225 Melanocytic nevi of trunk: Secondary | ICD-10-CM | POA: Diagnosis not present

## 2024-04-29 ENCOUNTER — Other Ambulatory Visit: Payer: Self-pay

## 2024-04-29 MED ORDER — EZETIMIBE 10 MG PO TABS: 10.0000 mg | ORAL_TABLET | Freq: Every day | ORAL | 3 refills | Status: AC

## 2024-04-30 ENCOUNTER — Encounter: Payer: Self-pay | Admitting: *Deleted

## 2024-04-30 DIAGNOSIS — F411 Generalized anxiety disorder: Secondary | ICD-10-CM

## 2024-04-30 DIAGNOSIS — B001 Herpesviral vesicular dermatitis: Secondary | ICD-10-CM

## 2024-04-30 DIAGNOSIS — N5201 Erectile dysfunction due to arterial insufficiency: Secondary | ICD-10-CM

## 2024-04-30 HISTORY — DX: Herpesviral vesicular dermatitis: B00.1

## 2024-04-30 HISTORY — DX: Generalized anxiety disorder: F41.1

## 2024-04-30 HISTORY — DX: Erectile dysfunction due to arterial insufficiency: N52.01

## 2024-06-03 NOTE — Progress Notes (Deleted)
 Cardiology Office Note:    Date:  06/03/2024   ID:  Scott Mcguire, DOB April 12, 1971, MRN [id number]  PCP:  Pia Kerney SQUIBB, MD   [facility] Providers Cardiologist:  Alean SAUNDERS Madireddy, MD     Referring MD: Pia Kerney SQUIBB, MD    History of Present Illness:    Scott Mcguire is a 53 y.o. male with a hx of coronary artery disease s/p CABG x 3 in 2024,  hypertension, migraines, paroxysmal atrial tachycardia, chronic prostatitis, hyperlipidemia, anxiety.  05/16/2023 CABG x 3  LIMA to LAD, SVG to ramus, SVG to diagonal. 05/14/2023 carotid ultrasound near normal bilaterally with only minimal wall thickening 04/04/2023 left heart cath multivessel coronary artery disease, consultation with CVTS for CABG 04/04/2023 echo EF 60 to 65%, mild LV hypertrophy of the basal septal segment, trivial MR 03/29/23 coronary CTA calcium  score 43.8, severe stenosis of the left circumflex and moderate stenosis of the LAD, FFR suggested flow-limiting stenosis in the LAD and left circumflex Coronary CTA in 2022 the calcium  score of 8.6, minimal CAD. 07/07/2020 monitor predominant rhythm sinus, 2 episodes SVT  He initially established care with Dr. Sheena in 2021 for evaluation of palpitations, he wore a monitor at that time that revealed brief episodes of SVT. He underwent a coronary CTA in 2022 revealing  mild CAD. Evaluated by myself in June 2024 for episodes of chest pain consistent with stable angina, we arranged a coronary CTA revealing severe stenosis in the left circumflex, moderate in the LAD. He underwent a left heart cath revealing multivessel CAD >> subsequently underwent CABG x 3 on 05/16/2023.   Most recently he was evaluated by myself on 10/04/2023 he was doing well from a cardiac perspective, he was participating with cardiac rehab and working on weight loss, no changes were made to medications or plan of care and he was advised to follow-up in 6 months.    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   Tachycardia     Past Surgical History:  Procedure Laterality Date   CORONARY ARTERY BYPASS GRAFT N/A 05/16/2023   Procedure: CORONARY ARTERY BYPASS GRAFTING (CABG) X3 USING LEFT INTERNAL MAMMARY ARTERY (LIMA) AND ENDOSCOPICALLY HARVESTED RIGHT GREATER SAPHENOUS VEIN;  Surgeon: Shyrl Linnie KIDD, MD;  Location: [facility] OR;  Service: Open Heart Surgery;  Laterality: N/A;   LEFT HEART CATH AND CORONARY ANGIOGRAPHY N/A 04/04/2023   Procedure: LEFT HEART CATH AND CORONARY ANGIOGRAPHY;  Surgeon: Dann Candyce RAMAN, MD;  Location: [facility] INVASIVE CV LAB;  Service: Cardiovascular;  Laterality: N/A;   NO PAST SURGERIES     TEE WITHOUT CARDIOVERSION N/A 05/16/2023   Procedure: TRANSESOPHAGEAL ECHOCARDIOGRAM;  Surgeon: Shyrl Linnie KIDD, MD;  Location: [facility] OR;  Service: Open Heart Surgery;  Laterality: N/A;    Current Medications: No outpatient medications have been marked as taking for the 06/06/24 encounter (Appointment) with Carlin Delon BROCKS, NP.     Allergies:   Ceftin [cefuroxime]   Social History   Socioeconomic History   Marital status: Married    Spouse name: Not on file   Number of children: Not on file   Years of education: Not on file  Highest education level: Not on file  Occupational History   Not on file  Tobacco Use   Smoking status: Never   Smokeless tobacco: Never  Substance and Sexual Activity   Alcohol use: Never   Drug use: Never   Sexual activity: Not on file  Other Topics Concern   Not on file  Social History Narrative   Not on file   Social Drivers of Health   Financial Resource Strain: Not on file  Food Insecurity: Not on file   Transportation Needs: Not on file  Physical Activity: Not on file  Stress: Not on file  Social Connections: Not on file     Family History: The patient's family history includes Heart disease in his father and mother.  ROS:   Please see the history of present illness.     All other systems reviewed and are negative.  EKGs/Labs/Other Studies Reviewed:   Cardiac Studies & Procedures   ______________________________________________________________________________________________ CARDIAC CATHETERIZATION  CARDIAC CATHETERIZATION 04/04/2023  Conclusion   Prox LAD to Mid LAD lesion is 70% stenosed.   Ost LAD to Prox LAD lesion is 50% stenosed.   Mid LAD lesion is 90% stenosed.   Ramus-1 lesion is 80% stenosed.   Ramus-2 lesion is 50% stenosed.   Ost Cx lesion is 40% stenosed.   Mid Cx lesion is 50% stenosed.   The left ventricular systolic function is normal.   LV end diastolic pressure is normal.   The left ventricular ejection fraction is 55-65% by visual estimate.   There is no aortic valve stenosis.   In the absence of any other complications or medical issues, we expect the patient to be ready for discharge from a cath perspective on 04/04/2023.  Multivessel coronary artery disease with the most severe disease being in the mid LAD and the ostial ramus branch.  There is another significant lesion in the proximal LAD.  The ostial to proximal LAD has diffuse, moderate disease.  There is moderate disease in the mid circumflex.  Cath works FFR was performed which showed that the circumflex disease was not significant. LAD FFR dropped to 0.72 in the mid vessel and 0.69 after all of the significant disease. The ramus FFR dropped to 0.83 after the initial lesion and to 0.77 in the mid vessel. Given the moderate LAD disease in the proximal vessel extends all the way back to the left main, landing the stent with strut hanging in the left main may further compromise the ramus vessel.  It  would be a long area of stent.  Will obtain cardiac surgical consult for possible bypass. One option would be hybrid option with LIMA to LAD (would check with surgery if robotic option is available).  Consider medical therapy of the ramus and if symptoms persisted, could pursue PCI of the ostial ramus.  Since the LAD would be protected, this would be feasible.  There may be some shift into the circumflex vessel but this is a relatively small vessel.  He will need aggressive medical therapy including aspirin  and statin.  Findings Coronary Findings Diagnostic  Dominance: Right  Left Anterior Descending Ost LAD to Prox LAD lesion is 50% stenosed. Prox LAD to Mid LAD lesion is 70% stenosed. Mid LAD lesion is 90% stenosed.  Ramus Intermedius Vessel is large. Ramus-1 lesion is 80% stenosed. Ramus-2 lesion is 50% stenosed.  Left Circumflex Ost Cx lesion is 40% stenosed. Mid Cx lesion is 50% stenosed.  Right Coronary Artery There is mild diffuse disease throughout  the vessel.  Intervention  No interventions have been documented.   STRESS TESTS  MYOCARDIAL PERFUSION IMAGING 06/15/2020   ECHOCARDIOGRAM  ECHOCARDIOGRAM COMPLETE 04/04/2023  Narrative ECHOCARDIOGRAM REPORT    Patient Name:   Scott Mcguire Date of Exam: 04/04/2023 Medical Rec #:  [id number]             Height:       70.0 in Accession #:    [id number]            Weight:       218.0 lb Date of Birth:  08-09-1971              BSA:          2.165 m Patient Age:    52 years              BP:           125/85 mmHg Patient Gender: M                     HR:           61 bpm. Exam Location:  Inpatient  Procedure: 2D Echo, Color Doppler and Cardiac Doppler  Indications:    Pre-Op  History:        Patient has no prior history of Echocardiogram examinations. CAD, Arrythmias:Tachycardia; Risk Factors:Hypertension.  Sonographer:    Jayson Gaskins Referring Phys: [id number] ARTIST POUCH  IMPRESSIONS   1. Left  ventricular ejection fraction, by estimation, is 60 to 65%. The left ventricle has normal function. The left ventricle has no regional wall motion abnormalities. There is mild asymmetric left ventricular hypertrophy of the basal-septal segment. Left ventricular diastolic parameters are indeterminate. 2. Right ventricular systolic function is normal. The right ventricular size is normal. Tricuspid regurgitation signal is inadequate for assessing PA pressure. 3. The mitral valve is normal in structure. Trivial mitral valve regurgitation. 4. The aortic valve is tricuspid. Aortic valve regurgitation is not visualized. No aortic stenosis is present.  FINDINGS Left Ventricle: Left ventricular ejection fraction, by estimation, is 60 to 65%. The left ventricle has normal function. The left ventricle has no regional wall motion abnormalities. The left ventricular internal cavity size was normal in size. There is mild asymmetric left ventricular hypertrophy of the basal-septal segment. Left ventricular diastolic parameters are indeterminate.  Right Ventricle: The right ventricular size is normal. No increase in right ventricular wall thickness. Right ventricular systolic function is normal. Tricuspid regurgitation signal is inadequate for assessing PA pressure.  Left Atrium: Left atrial size was normal in size.  Right Atrium: Right atrial size was normal in size.  Pericardium: There is no evidence of pericardial effusion.  Mitral Valve: The mitral valve is normal in structure. Trivial mitral valve regurgitation.  Tricuspid Valve: The tricuspid valve is normal in structure. Tricuspid valve regurgitation is trivial.  Aortic Valve: The aortic valve is tricuspid. Aortic valve regurgitation is not visualized. No aortic stenosis is present. Aortic valve peak gradient measures 4.7 mmHg.  Pulmonic Valve: The pulmonic valve was not well visualized. Pulmonic valve regurgitation is not visualized.  Aorta: The  aortic root and ascending aorta are structurally normal, with no evidence of dilitation.  IAS/Shunts: The interatrial septum was not well visualized.   LEFT VENTRICLE PLAX 2D LVIDd:         5.30 cm LVIDs:         3.40 cm LV PW:         1.10 cm LV  IVS:        1.20 cm LVOT diam:     2.00 cm LVOT Area:     3.14 cm   RIGHT VENTRICLE RV S prime:     14.80 cm/s TAPSE (M-mode): 3.1 cm  LEFT ATRIUM           Index LA Vol (A2C): 20.3 ml 9.38 ml/m LA Vol (A4C): 61.8 ml 28.54 ml/m AORTIC VALVE AV Area (Vmax): 2.48 cm AV Vmax:        108.00 cm/s AV Peak Grad:   4.7 mmHg LVOT Vmax:      85.40 cm/s  MITRAL VALVE MV Area (PHT): 3.50 cm     SHUNTS MV Decel Time: 217 msec     Systemic Diam: 2.00 cm MV E velocity: 79.70 cm/s MV A velocity: 104.00 cm/s MV E/A ratio:  0.77  Lonni Nanas MD Electronically signed by Lonni Nanas MD Signature Date/Time: 04/04/2023/1:48:20 PM    Final   TEE  ECHO INTRAOPERATIVE TEE 05/16/2023  Narrative *INTRAOPERATIVE TRANSESOPHAGEAL REPORT *    Patient Name:   Scott Mcguire Date of Exam: 05/16/2023 Medical Rec #:  [id number]             Height:       70.0 in Accession #:    [id number]            Weight:       198.0 lb Date of Birth:  03/26/1971              BSA:          2.08 m Patient Age:    52 years              BP:           153/110 mmHg Patient Gender: M                     HR:           72 bpm. Exam Location:  Anesthesiology  Transesophogeal exam was perform intraoperatively during surgical procedure. Patient was closely monitored under general anesthesia during the entirety of examination.  Indications:     CAD Native Vessel i25.10 Sonographer:     Damien Senior RDCS Performing Phys: Debby Like MD Diagnosing Phys: Debby Like MD  Complications: No known complications during this procedure. POST-OP IMPRESSIONS _ Left Ventricle: The left ventricle is unchanged from pre-bypass. _ Right Ventricle: The  right ventricle appears unchanged from pre-bypass. _ Aorta: The aorta appears unchanged from pre-bypass. _ Left Atrium: The left atrium appears unchanged from pre-bypass. _ Left Atrial Appendage: The left atrial appendage appears unchanged from pre-bypass. _ Aortic Valve: The aortic valve appears unchanged from pre-bypass. _ Mitral Valve: The mitral valve appears unchanged from pre-bypass. _ Tricuspid Valve: The tricuspid valve appears unchanged from pre-bypass. _ Pulmonic Valve: The pulmonic valve appears unchanged from pre-bypass. _ Interatrial Septum: The interatrial septum appears unchanged from pre-bypass. _ Interventricular Septum: The interventricular septum appears unchanged from pre-bypass. _ Pericardium: The pericardium appears unchanged from pre-bypass.  PRE-OP FINDINGS Left Ventricle: The left ventricle has normal systolic function, with an ejection fraction of 55-60%. The cavity size was normal. There is mild concentric left ventricular hypertrophy of the basal-septal segment.   Right Ventricle: The right ventricle has normal systolic function. The cavity was normal. There is no increase in right ventricular wall thickness.  Left Atrium: Left atrial size was normal in size.  Right Atrium: Right atrial size was  normal in size.  Interatrial Septum: No atrial level shunt detected by color flow Doppler.  Pericardium: There is no evidence of pericardial effusion.  Mitral Valve: The mitral valve is normal in structure. Mitral valve regurgitation is trivial by color flow Doppler. There is No evidence of mitral stenosis.  Tricuspid Valve: The tricuspid valve was normal in structure. Tricuspid valve regurgitation is trivial by color flow Doppler. No evidence of tricuspid stenosis is present.  Aortic Valve: The aortic valve is tricuspid Aortic valve regurgitation was not visualized by color flow Doppler. There is no stenosis of the aortic valve.   Pulmonic Valve: The pulmonic  valve was not assessed. Pulmonic valve regurgitation was not assessed by color flow Doppler.    Debby Like MD Electronically signed by Debby Like MD Signature Date/Time: 05/16/2023/1:36:47 PM    Final  MONITORS  LONG TERM MONITOR (3-14 DAYS) 07/07/2020  Narrative The patient wore the monitor for 3 days 1 hour starting 06/26/2020. Indication: Palpitations  The minimum heart rate was 52 bpm, maximum heart rate was 196 bpm, and average heart rate was 83 bpm. Predominant underlying rhythm was Sinus Rhythm.  2 Supraventricular Tachycardia runs occurred, the run with the fastest interval lasting 5 beats with a maximum rate of 171 bpm (average 146 bpm); the run with the fastest interval was also the longest.  Premature atrial complexes were rare. Premature Ventricular complexes were occasional (1.0%, 3730).  No ventricular tachycardia, no pauses, No AV block and no atrial fibrillation present. 3 patient triggered events all associated with premature ventricular complex. 5 diary events 3 associated with premature ventricular complex and the remaining with sinus rhythm.  Conclusion: This study is remarkable for the following: 1. Rare asymptomatic atrial tachycardia with variable block. 2. Occasional premature ventricular complexes.   CT SCANS  CT CORONARY MORPH W/CTA COR W/SCORE 03/29/2023  Addendum 04/05/2023  1:44 PM ADDENDUM REPORT: 04/05/2023 13:41  EXAM: OVER-READ INTERPRETATION  CT CHEST  The following report is an over-read performed by radiologist Dr. Elspeth Dada [facility] on 04/05/2023. This over-read does not include interpretation of cardiac or coronary anatomy or pathology. The cardiovascular interpretation by the cardiologist is attached.  COMPARISON:  11/09/2020  FINDINGS: No enlarged lymph nodes. The included lungs are clear with no nodules or pleural fluid seen. Thoracic spine degenerative changes. Unremarkable upper  abdomen.  IMPRESSION: No significant extracardiac findings.   Electronically Signed By: Elspeth Bathe M.D. On: 04/05/2023 13:41  Narrative CLINICAL DATA:  53 yo male with chest pain  EXAM: Cardiac/Coronary CTA  TECHNIQUE: A non-contrast, gated CT scan was obtained with axial slices of 3 mm through the heart for calcium  scoring. Calcium  scoring was performed using the Agatston method. A 120 kV prospective, gated, contrast cardiac scan was obtained. Gantry rotation speed was 250 msecs and collimation was 0.6 mm. Two sublingual nitroglycerin  tablets (0.8 mg) were given. The 3D data set was reconstructed in 5% intervals of the 35-75% of the R-R cycle. Diastolic phases were analyzed on a dedicated workstation using MPR, MIP, and VRT modes. The patient received 95 cc of contrast.  FINDINGS: Image quality: Poor.  Noise artifact is: Moderate (misregistration; signal to noise).  Coronary Arteries:  Normal coronary origin.  Right dominance.  Left main: The left main is a large caliber vessel with a normal take off from the left coronary cusp that trifurcates into a LAD, LCX, and ramus intermedius. There is no plaque or stenosis.  Left anterior descending artery: The LAD has moderate (50-69)  mixed plaque stenosis in the proximal vessel. The LAD gives off 2 small diagonal branches.  Ramus intermedius: Large, branching; moderate (50-69) stenosis in proximal, medial branch.  Left circumflex artery: The LCX is non-dominant and patent with severe (70-99) stenosis in the mid vessel. The LCX gives off 2 obtuse marginal branches not well visualized.  Right coronary artery: The RCA is dominant with normal take off from the right coronary cusp. There is no evidence of plaque or stenosis. The RCA terminates as a PDA and right posterolateral branch without evidence of plaque or stenosis.  Right Atrium: Right atrial size is within normal limits.  Right Ventricle: The right ventricular  cavity is within normal limits.  Left Atrium: Left atrial size is normal in size with no left atrial appendage filling defect.  Left Ventricle: The ventricular cavity size is within normal limits. There are no stigmata of prior infarction. There is no abnormal filling defect.  Pulmonary arteries: Normal in size without proximal filling defect.  Pulmonary veins: Normal pulmonary venous drainage.  Pericardium: Normal thickness with no significant effusion or calcium  present.  Cardiac valves: The aortic valve is trileaflet without significant calcification. The mitral valve is normal structure without significant calcification.  Aorta: Normal caliber with mild aortic atherosclerosis.  Extra-cardiac findings: See attached radiology report for non-cardiac structures.  IMPRESSION: 1. Coronary calcium  score of 43.8. This was 57 percentile for age-, sex, and race-matched controls.  2. Normal coronary origin with right dominance.  3. Severe (70-99) stenosis in the LCX and moderate (50-69) stenoses in the LAD and branch of the ramus intermedius.  4. Mild aortic atherosclerosis.  5. Study will be sent for FFR.  RECOMMENDATIONS: CAD-RADS 4: Severe stenosis. (70-99% or > 50% left main). Cardiac catheterization or CT FFR is recommended. Consider symptom-guided anti-ischemic pharmacotherapy as well as risk factor modification per guideline directed care. Invasive coronary angiography recommended with revascularization per published guideline statements.  Redell Shallow, MD  Electronically Signed: By: Redell Shallow M.D. On: 03/29/2023 18:55   CT SCANS  CT CORONARY MORPH W/CTA COR W/SCORE 11/09/2020  Addendum 11/10/2020  4:04 PM ADDENDUM REPORT: 11/10/2020 16:02  CLINICAL DATA:  This is a 53 year old male with chest pain  EXAM: Cardiac/Coronary  CT  TECHNIQUE: The patient was scanned on a [employer].  FINDINGS: A 120 kV prospective scan was triggered in  the descending thoracic aorta at 111 HU's. Axial non-contrast 3 mm slices were carried out through the heart. The data set was analyzed on a dedicated work station and scored using the Agatson method. Gantry rotation speed was 250 msecs and collimation was .6 mm. No beta blockade and 0.8 mg of sl NTG was given. The 3D data set was reconstructed in 5% intervals of the 67-82 % of the R-R cycle. Diastolic phases were analyzed on a dedicated work station using MPR, MIP and VRT modes. The patient received 80 cc of contrast.  Aorta: Normal size.  No calcifications.  No dissection.  Aortic Valve:  Trileaflet.  No calcifications.  Coronary calcium  score 8.6.  Coronary Arteries:  Normal coronary origin.  Right dominance.  RCA is a large dominant artery that gives rise to PDA and PLVB. There is no plaque.  Left main is a large artery that gives rise to LAD and LCX arteries.  LAD is a large vessel. There is a minimal calcified plaque in the mid LAD. The proximal and distal LAD.  LCX is a non-dominant artery that gives rise to one  large OM1 branch. There is no plaque. There is a stair-step artifact in the proximal LAD.  Other findings:  Normal pulmonary vein drainage into the left atrium.  Normal left atrial appendage without a thrombus.  Normal size of the pulmonary artery.  IMPRESSION: 1. Coronary calcium  score of 8.6. This was 38 percentile for age and sex matched control.  2. Normal coronary origin with right dominance.  3. Minimal CAD. CADRADS 1. Aggressive medical therapy is recommended.  Kardie Tobb, DO   Electronically Signed By: Kardie  Tobb DO On: 11/10/2020 16:02  Narrative EXAM: OVER-READ INTERPRETATION  CT CHEST  The following report is an over-read performed by radiologist Dr. Franky Crease of [facility] on 11/09/2020. This over-read does not include interpretation of cardiac or coronary anatomy or pathology. The coronary CTA interpretation by  the cardiologist is attached.  COMPARISON:  None.  FINDINGS: Vascular: Heart is normal size.  Aorta normal caliber.  Mediastinum/Nodes: No adenopathy  Lungs/Pleura: Visualized lungs clear.  No effusions.  Upper Abdomen: Imaging into the upper abdomen demonstrates no acute findings.  Musculoskeletal: Chest wall soft tissues are unremarkable. No acute bony abnormality.  IMPRESSION: No acute or significant extracardiac abnormality.  Electronically Signed: By: Franky Crease M.D. On: 11/09/2020 19:22     ______________________________________________________________________________________________       Recent Labs: 06/19/2023: BUN 11; Creatinine, Ser 1.03; Hemoglobin 13.9; Platelets 393; Potassium 4.2; Sodium 141  Recent Lipid Panel    Component Value Date/Time   LDLDIRECT 99 04/03/2023 1614     Risk Assessment/Calculations:        Physical Exam:    VS:  There were no vitals taken for this visit.    Wt Readings from Last 3 Encounters:  10/04/23 190 lb (86.2 kg)  07/05/23 186 lb (84.4 kg)  06/19/23 185 lb (83.9 kg)     GEN:  Well nourished, well developed in no acute distress HEENT: Normal NECK: No JVD; No carotid bruits LYMPHATICS: No lymphadenopathy CARDIAC: RRR, no murmurs, rubs, gallops RESPIRATORY:  Clear to auscultation without rales, wheezing or rhonchi  ABDOMEN: Soft, non-tender, non-distended MUSCULOSKELETAL:  No edema; No deformity  SKIN: Warm and dry NEUROLOGIC:  Alert and oriented x 3 PSYCHIATRIC:  Normal affect   ASSESSMENT:    1. Coronary artery disease involving native coronary artery of native heart without angina pectoris   2. S/P CABG x 3   3. Palpitations   4. Mixed hyperlipidemia   5. Elevated lipoprotein(a)        PLAN:    In order of problems listed above:  Coronary artery disease -s/p CABG x 3 on May 16, 2023 LIMA to LAD, SVG to ramus, SVG to diagonal.  He is recovering as expected.  Doing well from a cardiac  perspective, is participating in cardiac rehab 2 to 3 days a week.  Stable with no anginal symptoms. No indication for ischemic evaluation. He has maintained his weight loss, eating appropriately.  Continue aspirin , Lipitor , Zetia , metoprolol , nitroglycerin  as needed. Paroxysmal atrial tachycardia/symptomatic PVCs-quiescent, continue metoprolol  25 mg twice daily. Hyperlipidemia -most recent LDL was elevated at 99, continue Lipitor  80 mg daily, continue Zetia  10 mg daily. Will repeat FLP and LFTs next week.  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  Continuing with aggressive risk reduction per above.   Disposition -follow-up with general cardiology in 6 months, repeat FLP and LFTs next week.       Medication Adjustments/Labs and Tests Ordered: Current medicines are reviewed at length with  the patient today.  Concerns regarding medicines are outlined above.  No orders of the defined types were placed in this encounter.  No orders of the defined types were placed in this encounter.   There are no Patient Instructions on file for this visit.   Signed, Delon JAYSON Hoover, NP  06/03/2024 11:10 AM    [facility] [email or web address]

## 2024-06-06 ENCOUNTER — Ambulatory Visit: Admitting: Cardiology

## 2024-06-06 DIAGNOSIS — E7841 Elevated Lipoprotein(a): Secondary | ICD-10-CM

## 2024-06-06 DIAGNOSIS — I2510 Atherosclerotic heart disease of native coronary artery without angina pectoris: Secondary | ICD-10-CM

## 2024-06-06 DIAGNOSIS — E782 Mixed hyperlipidemia: Secondary | ICD-10-CM

## 2024-06-06 DIAGNOSIS — Z951 Presence of aortocoronary bypass graft: Secondary | ICD-10-CM

## 2024-06-06 DIAGNOSIS — R002 Palpitations: Secondary | ICD-10-CM

## 2024-06-30 NOTE — Progress Notes (Signed)
 " Cardiology Office Note:    Date:  07/02/2024   ID:  Scott Mcguire, DOB April 01, 1971, MRN [id number]  PCP:  Pia Kerney SQUIBB, MD   [facility] Providers Cardiologist:  Alean SAUNDERS Madireddy, MD     Referring MD: Pia Kerney SQUIBB, MD    History of Present Illness:    Scott Mcguire is a 53 y.o. male with a hx of coronary artery disease s/p CABG x 3 in 2024,  hypertension, migraines, paroxysmal atrial tachycardia, chronic prostatitis, hyperlipidemia, anxiety.  05/16/2023 CABG x 3  LIMA to LAD, SVG to ramus, SVG to diagonal. 05/14/2023 carotid ultrasound near normal bilaterally with only minimal wall thickening 04/04/2023 left heart cath multivessel coronary artery disease, consultation with CVTS for CABG 04/04/2023 echo EF 60 to 65%, mild LV hypertrophy of the basal septal segment, trivial MR 03/29/23 coronary CTA calcium  score 43.8, severe stenosis of the left circumflex and moderate stenosis of the LAD, FFR suggested flow-limiting stenosis in the LAD and left circumflex Coronary CTA in 2022 the calcium  score of 8.6, minimal CAD. 07/07/2020 monitor predominant rhythm sinus, 2 episodes SVT  He initially established care with Dr. Sheena in 2021 for evaluation of palpitations, he wore a monitor at that time that revealed brief episodes of SVT. He underwent a coronary CTA in 2022 revealing  mild CAD. Evaluated by myself in June 2024 for episodes of chest pain consistent with stable angina, we arranged a coronary CTA revealing severe stenosis in the left circumflex, moderate in the LAD. He underwent a left heart cath revealing multivessel CAD >> subsequently underwent CABG x 3 on 05/16/2023.   Most recently he was evaluated by myself on 10/04/2023 he was doing well from a cardiac perspective, he was participating with cardiac rehab and working on weight loss, no changes were made to medications or plan of care and he was advised to follow-up in 6 months.  He presents today accompanied by  his wife for follow-up of his CAD.  He is overall been doing stable from a cardiac perspective but he is dealing with a high level of stress at work.  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  He has needed to increase his intake of his anxiolytic. He is trying to avoid taking an antidepressant for stress.  He has had a few episodes where his blood pressure has been in the 150/100 range especially after a stressful meeting at work, at these times he also has some chest tightness but he feels very confident that they are related to increased rest. He denies chest pain, palpitations, dyspnea, pnd, orthopnea, n, v, dizziness, syncope, edema, weight gain, or early satiety.     Past Medical History:  Diagnosis Date   Anginal pain    Anxiety    Chronic prostatitis 06/02/2014   Disorder of male genital organs 06/02/2014   Dyspnea    Dyspnea on exertion 10/29/2020   Erectile dysfunction due to arterial insufficiency 04/30/2024   Essential hypertension 10/29/2020   Flank pain 06/02/2014   Generalized anxiety disorder 04/30/2024   Herpesviral vesicular dermatitis 04/30/2024   Hypercholesterolemia 06/02/2014   Hypertension 06/02/2014   Hypogonadism in male 06/02/2014   Migraine headache 06/02/2014   Mixed hyperlipidemia 10/29/2020   Obesity (BMI 30-39.9) 07/23/2020   PAT (paroxysmal atrial tachycardia) 07/23/2020   S/P CABG x 3 05/16/2023   Symptomatic PVCs 07/23/2020   Tachycardia  Past Surgical History:  Procedure Laterality Date   CORONARY ARTERY BYPASS GRAFT N/A 05/16/2023   Procedure: CORONARY ARTERY BYPASS GRAFTING (CABG) X3 USING LEFT INTERNAL MAMMARY ARTERY (LIMA) AND ENDOSCOPICALLY HARVESTED RIGHT GREATER SAPHENOUS VEIN;  Surgeon: Shyrl Linnie KIDD, MD;  Location: [facility] OR;  Service: Open Heart Surgery;  Laterality: N/A;   LEFT HEART CATH AND CORONARY ANGIOGRAPHY N/A 04/04/2023   Procedure: LEFT HEART  CATH AND CORONARY ANGIOGRAPHY;  Surgeon: Dann Candyce RAMAN, MD;  Location: [facility] INVASIVE CV LAB;  Service: Cardiovascular;  Laterality: N/A;   NO PAST SURGERIES     TEE WITHOUT CARDIOVERSION N/A 05/16/2023   Procedure: TRANSESOPHAGEAL ECHOCARDIOGRAM;  Surgeon: Shyrl Linnie KIDD, MD;  Location: [facility] OR;  Service: Open Heart Surgery;  Laterality: N/A;    Current Medications: Current Meds  Medication Sig   acetaminophen  (TYLENOL ) 500 MG tablet Take 1,000 mg by mouth every 6 (six) hours as needed (pain/headaches.).   ALPRAZolam  (XANAX ) 1 MG tablet Take 1 mg by mouth at bedtime.   aspirin  EC 325 MG tablet Take 1 tablet (325 mg total) by mouth daily.   atorvastatin  (LIPITOR ) 80 MG tablet Take 80 mg by mouth in the morning.   cetirizine (ZYRTEC) 10 MG tablet Take 10 mg by mouth daily as needed for allergies.   diltiazem  (CARDIZEM  CD) 240 MG 24 hr capsule Take 1 capsule (240 mg total) by mouth daily.   ezetimibe  (ZETIA ) 10 MG tablet Take 1 tablet (10 mg total) by mouth at bedtime.   guaiFENesin  (MUCINEX ) 600 MG 12 hr tablet Take 1 tablet (600 mg total) by mouth 2 (two) times daily.   ketotifen  (ALAWAY ) 0.035 % ophthalmic solution Place 1 drop into both eyes 2 (two) times daily as needed (allergy eyes/irritation.).   metoprolol  tartrate (LOPRESSOR ) 25 MG tablet Take 1 tablet (25 mg total) by mouth 2 (two) times daily.   nitroGLYCERIN  (NITROSTAT ) 0.4 MG SL tablet Place 1 tablet (0.4 mg total) under the tongue every 5 (five) minutes as needed for chest pain.   omeprazole (PRILOSEC) 40 MG capsule Take 40 mg by mouth daily before breakfast.     Allergies:   Ceftin [cefuroxime]   Social History   Socioeconomic History   Marital status: Married    Spouse name: Not on file   Number of children: Not on file   Years of education: Not on file   Highest education level: Not on file  Occupational History   Not on file  Tobacco Use   Smoking status: Never   Smokeless tobacco: Never  Substance and  Sexual Activity   Alcohol use: Never   Drug use: Never   Sexual activity: Not on file  Other Topics Concern   Not on file  Social History Narrative   Not on file   Social Drivers of Health   Financial Resource Strain: Not on file  Food Insecurity: Not on file  Transportation Needs: Not on file  Physical Activity: Not on file  Stress: Not on file  Social Connections: Not on file     Family History: The patient's family history includes Heart disease in his father and mother.  ROS:   Review of Systems  Psychiatric/Behavioral:         High level of stress at work  All other systems reviewed and are negative.    EKGs/Labs/Other Studies Reviewed:   Cardiac Studies & Procedures   ______________________________________________________________________________________________ CARDIAC CATHETERIZATION  CARDIAC CATHETERIZATION 04/04/2023  Conclusion   Prox LAD to Mid LAD lesion  is 70% stenosed.   Ost LAD to Prox LAD lesion is 50% stenosed.   Mid LAD lesion is 90% stenosed.   Ramus-1 lesion is 80% stenosed.   Ramus-2 lesion is 50% stenosed.   Ost Cx lesion is 40% stenosed.   Mid Cx lesion is 50% stenosed.   The left ventricular systolic function is normal.   LV end diastolic pressure is normal.   The left ventricular ejection fraction is 55-65% by visual estimate.   There is no aortic valve stenosis.   In the absence of any other complications or medical issues, we expect the patient to be ready for discharge from a cath perspective on 04/04/2023.  Multivessel coronary artery disease with the most severe disease being in the mid LAD and the ostial ramus branch.  There is another significant lesion in the proximal LAD.  The ostial to proximal LAD has diffuse, moderate disease.  There is moderate disease in the mid circumflex.  Cath works FFR was performed which showed that the circumflex disease was not significant. LAD FFR dropped to 0.72 in the mid vessel and 0.69 after all of  the significant disease. The ramus FFR dropped to 0.83 after the initial lesion and to 0.77 in the mid vessel. Given the moderate LAD disease in the proximal vessel extends all the way back to the left main, landing the stent with strut hanging in the left main may further compromise the ramus vessel.  It would be a long area of stent.  Will obtain cardiac surgical consult for possible bypass. One option would be hybrid option with LIMA to LAD (would check with surgery if robotic option is available).  Consider medical therapy of the ramus and if symptoms persisted, could pursue PCI of the ostial ramus.  Since the LAD would be protected, this would be feasible.  There may be some shift into the circumflex vessel but this is a relatively small vessel.  He will need aggressive medical therapy including aspirin  and statin.  Findings Coronary Findings Diagnostic  Dominance: Right  Left Anterior Descending Ost LAD to Prox LAD lesion is 50% stenosed. Prox LAD to Mid LAD lesion is 70% stenosed. Mid LAD lesion is 90% stenosed.  Ramus Intermedius Vessel is large. Ramus-1 lesion is 80% stenosed. Ramus-2 lesion is 50% stenosed.  Left Circumflex Ost Cx lesion is 40% stenosed. Mid Cx lesion is 50% stenosed.  Right Coronary Artery There is mild diffuse disease throughout the vessel.  Intervention  No interventions have been documented.   STRESS TESTS  MYOCARDIAL PERFUSION IMAGING 06/15/2020   ECHOCARDIOGRAM  ECHOCARDIOGRAM COMPLETE 04/04/2023  Narrative ECHOCARDIOGRAM REPORT    Patient Name:   Scott Mcguire Date of Exam: 04/04/2023 Medical Rec #:  [id number]             Height:       70.0 in Accession #:    [id number]            Weight:       218.0 lb Date of Birth:  17-Jan-1971              BSA:          2.165 m Patient Age:    52 years              BP:           125/85 mmHg Patient Gender: M  HR:           61 bpm. Exam Location:  Inpatient  Procedure: 2D  Echo, Color Doppler and Cardiac Doppler  Indications:    Pre-Op  History:        Patient has no prior history of Echocardiogram examinations. CAD, Arrythmias:Tachycardia; Risk Factors:Hypertension.  Sonographer:    Jayson Gaskins Referring Phys: [id number] ARTIST POUCH  IMPRESSIONS   1. Left ventricular ejection fraction, by estimation, is 60 to 65%. The left ventricle has normal function. The left ventricle has no regional wall motion abnormalities. There is mild asymmetric left ventricular hypertrophy of the basal-septal segment. Left ventricular diastolic parameters are indeterminate. 2. Right ventricular systolic function is normal. The right ventricular size is normal. Tricuspid regurgitation signal is inadequate for assessing PA pressure. 3. The mitral valve is normal in structure. Trivial mitral valve regurgitation. 4. The aortic valve is tricuspid. Aortic valve regurgitation is not visualized. No aortic stenosis is present.  FINDINGS Left Ventricle: Left ventricular ejection fraction, by estimation, is 60 to 65%. The left ventricle has normal function. The left ventricle has no regional wall motion abnormalities. The left ventricular internal cavity size was normal in size. There is mild asymmetric left ventricular hypertrophy of the basal-septal segment. Left ventricular diastolic parameters are indeterminate.  Right Ventricle: The right ventricular size is normal. No increase in right ventricular wall thickness. Right ventricular systolic function is normal. Tricuspid regurgitation signal is inadequate for assessing PA pressure.  Left Atrium: Left atrial size was normal in size.  Right Atrium: Right atrial size was normal in size.  Pericardium: There is no evidence of pericardial effusion.  Mitral Valve: The mitral valve is normal in structure. Trivial mitral valve regurgitation.  Tricuspid Valve: The tricuspid valve is normal in structure. Tricuspid valve regurgitation is  trivial.  Aortic Valve: The aortic valve is tricuspid. Aortic valve regurgitation is not visualized. No aortic stenosis is present. Aortic valve peak gradient measures 4.7 mmHg.  Pulmonic Valve: The pulmonic valve was not well visualized. Pulmonic valve regurgitation is not visualized.  Aorta: The aortic root and ascending aorta are structurally normal, with no evidence of dilitation.  IAS/Shunts: The interatrial septum was not well visualized.   LEFT VENTRICLE PLAX 2D LVIDd:         5.30 cm LVIDs:         3.40 cm LV PW:         1.10 cm LV IVS:        1.20 cm LVOT diam:     2.00 cm LVOT Area:     3.14 cm   RIGHT VENTRICLE RV S prime:     14.80 cm/s TAPSE (M-mode): 3.1 cm  LEFT ATRIUM           Index LA Vol (A2C): 20.3 ml 9.38 ml/m LA Vol (A4C): 61.8 ml 28.54 ml/m AORTIC VALVE AV Area (Vmax): 2.48 cm AV Vmax:        108.00 cm/s AV Peak Grad:   4.7 mmHg LVOT Vmax:      85.40 cm/s  MITRAL VALVE MV Area (PHT): 3.50 cm     SHUNTS MV Decel Time: 217 msec     Systemic Diam: 2.00 cm MV E velocity: 79.70 cm/s MV A velocity: 104.00 cm/s MV E/A ratio:  0.77  Lonni Nanas MD Electronically signed by Lonni Nanas MD Signature Date/Time: 04/04/2023/1:48:20 PM    Final   TEE  ECHO INTRAOPERATIVE TEE 05/16/2023  Narrative *INTRAOPERATIVE TRANSESOPHAGEAL REPORT *    Patient  Name:   Scott Mcguire Date of Exam: 05/16/2023 Medical Rec #:  [id number]             Height:       70.0 in Accession #:    [id number]            Weight:       198.0 lb Date of Birth:  [birth year]              BSA:          2.08 m Patient Age:    52 years              BP:           153/110 mmHg Patient Gender: M                     HR:           72 bpm. Exam Location:  Anesthesiology  Transesophogeal exam was perform intraoperatively during surgical procedure. Patient was closely monitored under general anesthesia during the entirety of examination.  Indications:     CAD  Native Vessel i25.10 Sonographer:     Damien Senior RDCS Performing Phys: Debby Like MD Diagnosing Phys: Debby Like MD  Complications: No known complications during this procedure. POST-OP IMPRESSIONS _ Left Ventricle: The left ventricle is unchanged from pre-bypass. _ Right Ventricle: The right ventricle appears unchanged from pre-bypass. _ Aorta: The aorta appears unchanged from pre-bypass. _ Left Atrium: The left atrium appears unchanged from pre-bypass. _ Left Atrial Appendage: The left atrial appendage appears unchanged from pre-bypass. _ Aortic Valve: The aortic valve appears unchanged from pre-bypass. _ Mitral Valve: The mitral valve appears unchanged from pre-bypass. _ Tricuspid Valve: The tricuspid valve appears unchanged from pre-bypass. _ Pulmonic Valve: The pulmonic valve appears unchanged from pre-bypass. _ Interatrial Septum: The interatrial septum appears unchanged from pre-bypass. _ Interventricular Septum: The interventricular septum appears unchanged from pre-bypass. _ Pericardium: The pericardium appears unchanged from pre-bypass.  PRE-OP FINDINGS Left Ventricle: The left ventricle has normal systolic function, with an ejection fraction of 55-60%. The cavity size was normal. There is mild concentric left ventricular hypertrophy of the basal-septal segment.   Right Ventricle: The right ventricle has normal systolic function. The cavity was normal. There is no increase in right ventricular wall thickness.  Left Atrium: Left atrial size was normal in size.  Right Atrium: Right atrial size was normal in size.  Interatrial Septum: No atrial level shunt detected by color flow Doppler.  Pericardium: There is no evidence of pericardial effusion.  Mitral Valve: The mitral valve is normal in structure. Mitral valve regurgitation is trivial by color flow Doppler. There is No evidence of mitral stenosis.  Tricuspid Valve: The tricuspid valve was normal in structure.  Tricuspid valve regurgitation is trivial by color flow Doppler. No evidence of tricuspid stenosis is present.  Aortic Valve: The aortic valve is tricuspid Aortic valve regurgitation was not visualized by color flow Doppler. There is no stenosis of the aortic valve.   Pulmonic Valve: The pulmonic valve was not assessed. Pulmonic valve regurgitation was not assessed by color flow Doppler.    Debby Like MD Electronically signed by Debby Like MD Signature Date/Time: 05/16/2023/1:36:47 PM    Final  MONITORS  LONG TERM MONITOR (3-14 DAYS) 07/07/2020  Narrative The patient wore the monitor for 3 days 1 hour starting 06/26/2020. Indication: Palpitations  The minimum heart rate was 52 bpm, maximum heart rate was 196 bpm,  and average heart rate was 83 bpm. Predominant underlying rhythm was Sinus Rhythm.  2 Supraventricular Tachycardia runs occurred, the run with the fastest interval lasting 5 beats with a maximum rate of 171 bpm (average 146 bpm); the run with the fastest interval was also the longest.  Premature atrial complexes were rare. Premature Ventricular complexes were occasional (1.0%, 3730).  No ventricular tachycardia, no pauses, No AV block and no atrial fibrillation present. 3 patient triggered events all associated with premature ventricular complex. 5 diary events 3 associated with premature ventricular complex and the remaining with sinus rhythm.  Conclusion: This study is remarkable for the following: 1. Rare asymptomatic atrial tachycardia with variable block. 2. Occasional premature ventricular complexes.   CT SCANS  CT CORONARY MORPH W/CTA COR W/SCORE 03/29/2023  Addendum 04/05/2023  1:44 PM ADDENDUM REPORT: 04/05/2023 13:41  EXAM: OVER-READ INTERPRETATION  CT CHEST  The following report is an over-read performed by radiologist Dr. Elspeth Dada [facility] on 04/05/2023. This over-read does not include interpretation of cardiac or coronary  anatomy or pathology. The cardiovascular interpretation by the cardiologist is attached.  COMPARISON:  11/09/2020  FINDINGS: No enlarged lymph nodes. The included lungs are clear with no nodules or pleural fluid seen. Thoracic spine degenerative changes. Unremarkable upper abdomen.  IMPRESSION: No significant extracardiac findings.   Electronically Signed By: Elspeth Bathe M.D. On: 04/05/2023 13:41  Narrative CLINICAL DATA:  53 yo male with chest pain  EXAM: Cardiac/Coronary CTA  TECHNIQUE: A non-contrast, gated CT scan was obtained with axial slices of 3 mm through the heart for calcium  scoring. Calcium  scoring was performed using the Agatston method. A 120 kV prospective, gated, contrast cardiac scan was obtained. Gantry rotation speed was 250 msecs and collimation was 0.6 mm. Two sublingual nitroglycerin  tablets (0.8 mg) were given. The 3D data set was reconstructed in 5% intervals of the 35-75% of the R-R cycle. Diastolic phases were analyzed on a dedicated workstation using MPR, MIP, and VRT modes. The patient received 95 cc of contrast.  FINDINGS: Image quality: Poor.  Noise artifact is: Moderate (misregistration; signal to noise).  Coronary Arteries:  Normal coronary origin.  Right dominance.  Left main: The left main is a large caliber vessel with a normal take off from the left coronary cusp that trifurcates into a LAD, LCX, and ramus intermedius. There is no plaque or stenosis.  Left anterior descending artery: The LAD has moderate (50-69) mixed plaque stenosis in the proximal vessel. The LAD gives off 2 small diagonal branches.  Ramus intermedius: Large, branching; moderate (50-69) stenosis in proximal, medial branch.  Left circumflex artery: The LCX is non-dominant and patent with severe (70-99) stenosis in the mid vessel. The LCX gives off 2 obtuse marginal branches not well visualized.  Right coronary artery: The RCA is dominant with normal take  off from the right coronary cusp. There is no evidence of plaque or stenosis. The RCA terminates as a PDA and right posterolateral branch without evidence of plaque or stenosis.  Right Atrium: Right atrial size is within normal limits.  Right Ventricle: The right ventricular cavity is within normal limits.  Left Atrium: Left atrial size is normal in size with no left atrial appendage filling defect.  Left Ventricle: The ventricular cavity size is within normal limits. There are no stigmata of prior infarction. There is no abnormal filling defect.  Pulmonary arteries: Normal in size without proximal filling defect.  Pulmonary veins: Normal pulmonary venous drainage.  Pericardium: Normal thickness with  no significant effusion or calcium  present.  Cardiac valves: The aortic valve is trileaflet without significant calcification. The mitral valve is normal structure without significant calcification.  Aorta: Normal caliber with mild aortic atherosclerosis.  Extra-cardiac findings: See attached radiology report for non-cardiac structures.  IMPRESSION: 1. Coronary calcium  score of 43.8. This was 20 percentile for age-, sex, and race-matched controls.  2. Normal coronary origin with right dominance.  3. Severe (70-99) stenosis in the LCX and moderate (50-69) stenoses in the LAD and branch of the ramus intermedius.  4. Mild aortic atherosclerosis.  5. Study will be sent for FFR.  RECOMMENDATIONS: CAD-RADS 4: Severe stenosis. (70-99% or > 50% left main). Cardiac catheterization or CT FFR is recommended. Consider symptom-guided anti-ischemic pharmacotherapy as well as risk factor modification per guideline directed care. Invasive coronary angiography recommended with revascularization per published guideline statements.  Redell Shallow, MD  Electronically Signed: By: Redell Shallow M.D. On: 03/29/2023 18:55   CT SCANS  CT CORONARY MORPH W/CTA COR W/SCORE  11/09/2020  Addendum 11/10/2020  4:04 PM ADDENDUM REPORT: 11/10/2020 16:02  CLINICAL DATA:  This is a 53 year old male with chest pain  EXAM: Cardiac/Coronary  CT  TECHNIQUE: The patient was scanned on a [employer].  FINDINGS: A 120 kV prospective scan was triggered in the descending thoracic aorta at 111 HU's. Axial non-contrast 3 mm slices were carried out through the heart. The data set was analyzed on a dedicated work station and scored using the Agatson method. Gantry rotation speed was 250 msecs and collimation was .6 mm. No beta blockade and 0.8 mg of sl NTG was given. The 3D data set was reconstructed in 5% intervals of the 67-82 % of the R-R cycle. Diastolic phases were analyzed on a dedicated work station using MPR, MIP and VRT modes. The patient received 80 cc of contrast.  Aorta: Normal size.  No calcifications.  No dissection.  Aortic Valve:  Trileaflet.  No calcifications.  Coronary calcium  score 8.6.  Coronary Arteries:  Normal coronary origin.  Right dominance.  RCA is a large dominant artery that gives rise to PDA and PLVB. There is no plaque.  Left main is a large artery that gives rise to LAD and LCX arteries.  LAD is a large vessel. There is a minimal calcified plaque in the mid LAD. The proximal and distal LAD.  LCX is a non-dominant artery that gives rise to one large OM1 branch. There is no plaque. There is a stair-step artifact in the proximal LAD.  Other findings:  Normal pulmonary vein drainage into the left atrium.  Normal left atrial appendage without a thrombus.  Normal size of the pulmonary artery.  IMPRESSION: 1. Coronary calcium  score of 8.6. This was 76 percentile for age and sex matched control.  2. Normal coronary origin with right dominance.  3. Minimal CAD. CADRADS 1. Aggressive medical therapy is recommended.  Kardie Tobb, DO   Electronically Signed By: Kardie  Tobb DO On: 11/10/2020  16:02  Narrative EXAM: OVER-READ INTERPRETATION  CT CHEST  The following report is an over-read performed by radiologist Dr. Franky Crease of [facility] on 11/09/2020. This over-read does not include interpretation of cardiac or coronary anatomy or pathology. The coronary CTA interpretation by the cardiologist is attached.  COMPARISON:  None.  FINDINGS: Vascular: Heart is normal size.  Aorta normal caliber.  Mediastinum/Nodes: No adenopathy  Lungs/Pleura: Visualized lungs clear.  No effusions.  Upper Abdomen: Imaging into the upper abdomen demonstrates  no acute findings.  Musculoskeletal: Chest wall soft tissues are unremarkable. No acute bony abnormality.  IMPRESSION: No acute or significant extracardiac abnormality.  Electronically Signed: By: Franky Crease M.D. On: 11/09/2020 19:22     ______________________________________________________________________________________________       Recent Labs: No results found for requested labs within last 365 days.  Recent Lipid Panel    Component Value Date/Time   LDLDIRECT 99 04/03/2023 1614     Risk Assessment/Calculations:        Physical Exam:    VS:  Ht 5' 10 (1.778 m)   Wt 191 lb (86.6 kg)   BMI 27.41 kg/m     Wt Readings from Last 3 Encounters:  07/02/24 191 lb (86.6 kg)  10/04/23 190 lb (86.2 kg)  07/05/23 186 lb (84.4 kg)     GEN:  Well nourished, well developed in no acute distress HEENT: Normal NECK: No JVD; No carotid bruits LYMPHATICS: No lymphadenopathy CARDIAC: RRR, no murmurs, rubs, gallops RESPIRATORY:  Clear to auscultation without rales, wheezing or rhonchi  ABDOMEN: Soft, non-tender, non-distended MUSCULOSKELETAL:  No edema; No deformity  SKIN: Warm and dry NEUROLOGIC:  Alert and oriented x 3 PSYCHIATRIC:  Normal affect   ASSESSMENT:    1. Coronary artery disease involving native coronary artery of native heart without angina pectoris   2. S/P CABG x 3   3. PAT  (paroxysmal atrial tachycardia)   4. Elevated lipoprotein(a)   5. Mixed hyperlipidemia         PLAN:    In order of problems listed above:  Coronary artery disease -s/p CABG x 3 on May 16, 2023 LIMA to LAD, SVG to ramus, SVG to diagonal.  Stable with no anginal symptoms. No indication for ischemic evaluation. He has maintained his weight loss, eating appropriately.  Continue aspirin , Lipitor , Zetia , metoprolol , nitroglycerin  as needed--has not needed.   Paroxysmal atrial tachycardia/symptomatic PVCs-quiescent, continue metoprolol  25 mg twice daily.  Hyperlipidemia -most recent LDL was elevated at 99, continue Lipitor  80 mg daily, continue Zetia  10 mg daily. Will repeat CMET, direct LDL today.   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  Continuing with aggressive risk reduction per above.  Screening for DM - will check A1C.   6.   Work related stress -works been extremely stressful for him, he is trying to do the things he can to take care of himself but this has been difficult.  He plans on speaking with his PCP about this. Heart healthy diet and regular cardiovascular exercise encouraged.     Disposition  -CBC, c-Met, direct LDL, A1c, follow-up in 6 months.      Medication Adjustments/Labs and Tests Ordered: Current medicines are reviewed at length with the patient today.  Concerns regarding medicines are outlined above.  Orders Placed This Encounter  Procedures   EKG 12-Lead   No orders of the defined types were placed in this encounter.   There are no Patient Instructions on file for this visit.   Signed, Delon JAYSON Hoover, NP  07/02/2024 9:29 AM    [facility] "

## 2024-07-01 DIAGNOSIS — I209 Angina pectoris, unspecified: Secondary | ICD-10-CM | POA: Insufficient documentation

## 2024-07-01 DIAGNOSIS — R079 Chest pain, unspecified: Secondary | ICD-10-CM | POA: Insufficient documentation

## 2024-07-01 DIAGNOSIS — R0600 Dyspnea, unspecified: Secondary | ICD-10-CM | POA: Insufficient documentation

## 2024-07-02 ENCOUNTER — Ambulatory Visit: Attending: Cardiology | Admitting: Cardiology

## 2024-07-02 ENCOUNTER — Encounter: Payer: Self-pay | Admitting: Cardiology

## 2024-07-02 VITALS — BP 120/84 | HR 53 | Ht 70.0 in | Wt 191.0 lb

## 2024-07-02 DIAGNOSIS — I4719 Other supraventricular tachycardia: Secondary | ICD-10-CM | POA: Diagnosis not present

## 2024-07-02 DIAGNOSIS — E7841 Elevated Lipoprotein(a): Secondary | ICD-10-CM | POA: Diagnosis not present

## 2024-07-02 DIAGNOSIS — E782 Mixed hyperlipidemia: Secondary | ICD-10-CM | POA: Diagnosis not present

## 2024-07-02 DIAGNOSIS — I2510 Atherosclerotic heart disease of native coronary artery without angina pectoris: Secondary | ICD-10-CM | POA: Diagnosis not present

## 2024-07-02 DIAGNOSIS — Z566 Other physical and mental strain related to work: Secondary | ICD-10-CM

## 2024-07-02 DIAGNOSIS — Z131 Encounter for screening for diabetes mellitus: Secondary | ICD-10-CM | POA: Diagnosis not present

## 2024-07-02 DIAGNOSIS — Z951 Presence of aortocoronary bypass graft: Secondary | ICD-10-CM | POA: Diagnosis not present

## 2024-07-02 NOTE — Patient Instructions (Signed)
 Medication Instructions:  Your physician recommends that you continue on your current medications as directed. Please refer to the Current Medication list given to you today.  *If you need a refill on your cardiac medications before your next appointment, please call your pharmacy*  Lab Work: Your physician recommends that you return for lab work in: today for CMP, CBC, Direct LDL and A1C  If you have labs (blood work) drawn today and your tests are completely normal, you will receive your results only by: MyChart Message (if you have MyChart) OR A paper copy in the mail If you have any lab test that is abnormal or we need to change your treatment, we will call you to review the results.  Testing/Procedures: NONE  Follow-Up: At [facility], you and your health needs are our priority.  As part of our continuing mission to provide you with exceptional heart care, our providers are all part of one team.  This team includes your primary Cardiologist (physician) and Advanced Practice Providers or APPs (Physician Assistants and Nurse Practitioners) who all work together to provide you with the care you need, when you need it.  Your next appointment:   6 month(s)  Provider:   Delon Hoover, NP  We recommend signing up for the patient portal called MyChart.  Sign up information is provided on this After Visit Summary.  MyChart is used to connect with patients for Virtual Visits (Telemedicine).  Patients are able to view lab/test results, encounter notes, upcoming appointments, etc.  Non-urgent messages can be sent to your provider as well.   To learn more about what you can do with MyChart, go to [email or web address].   Other Instructions

## 2024-07-03 ENCOUNTER — Ambulatory Visit: Payer: Self-pay | Admitting: Cardiology

## 2024-07-03 DIAGNOSIS — E782 Mixed hyperlipidemia: Secondary | ICD-10-CM | POA: Diagnosis not present

## 2024-07-03 DIAGNOSIS — I10 Essential (primary) hypertension: Secondary | ICD-10-CM | POA: Diagnosis not present

## 2024-07-03 DIAGNOSIS — E559 Vitamin D deficiency, unspecified: Secondary | ICD-10-CM | POA: Diagnosis not present

## 2024-07-03 DIAGNOSIS — K219 Gastro-esophageal reflux disease without esophagitis: Secondary | ICD-10-CM | POA: Diagnosis not present

## 2024-07-03 LAB — LDL CHOLESTEROL, DIRECT: LDL Direct: 66 mg/dL (ref 0–99)

## 2024-07-03 LAB — COMPREHENSIVE METABOLIC PANEL WITH GFR
ALT: 14 IU/L (ref 0–44)
AST: 17 IU/L (ref 0–40)
Albumin: 4.6 g/dL (ref 3.8–4.9)
Alkaline Phosphatase: 114 IU/L (ref 47–123)
BUN/Creatinine Ratio: 18 (ref 9–20)
BUN: 21 mg/dL (ref 6–24)
Bilirubin Total: 0.8 mg/dL (ref 0.0–1.2)
CO2: 19 mmol/L — ABNORMAL LOW (ref 20–29)
Calcium: 9.9 mg/dL (ref 8.7–10.2)
Chloride: 103 mmol/L (ref 96–106)
Creatinine, Ser: 1.19 mg/dL (ref 0.76–1.27)
Globulin, Total: 2.1 g/dL (ref 1.5–4.5)
Glucose: 79 mg/dL (ref 70–99)
Potassium: 4.7 mmol/L (ref 3.5–5.2)
Sodium: 138 mmol/L (ref 134–144)
Total Protein: 6.7 g/dL (ref 6.0–8.5)
eGFR: 73 mL/min/1.73

## 2024-07-03 LAB — CBC WITH DIFFERENTIAL/PLATELET
Basophils Absolute: 0.1 x10E3/uL (ref 0.0–0.2)
Basos: 1 %
EOS (ABSOLUTE): 0.2 x10E3/uL (ref 0.0–0.4)
Eos: 3 %
Hematocrit: 47.8 % (ref 37.5–51.0)
Hemoglobin: 15.4 g/dL (ref 13.0–17.7)
Immature Grans (Abs): 0 x10E3/uL (ref 0.0–0.1)
Immature Granulocytes: 0 %
Lymphocytes Absolute: 1.9 x10E3/uL (ref 0.7–3.1)
Lymphs: 28 %
MCH: 30 pg (ref 26.6–33.0)
MCHC: 32.2 g/dL (ref 31.5–35.7)
MCV: 93 fL (ref 79–97)
Monocytes Absolute: 0.8 x10E3/uL (ref 0.1–0.9)
Monocytes: 12 %
Neutrophils Absolute: 3.7 x10E3/uL (ref 1.4–7.0)
Neutrophils: 56 %
Platelets: 308 x10E3/uL (ref 150–450)
RBC: 5.13 x10E6/uL (ref 4.14–5.80)
RDW: 12.4 % (ref 11.6–15.4)
WBC: 6.6 x10E3/uL (ref 3.4–10.8)

## 2024-07-03 LAB — HEMOGLOBIN A1C
Est. average glucose Bld gHb Est-mCnc: 108 mg/dL
Hgb A1c MFr Bld: 5.4 % (ref 4.8–5.6)

## 2024-10-22 ENCOUNTER — Ambulatory Visit

## 2024-10-22 VITALS — BP 130/88 | HR 60 | Ht 70.0 in | Wt 190.4 lb

## 2024-10-22 DIAGNOSIS — R002 Palpitations: Secondary | ICD-10-CM

## 2024-10-22 DIAGNOSIS — I10 Essential (primary) hypertension: Secondary | ICD-10-CM

## 2024-10-22 DIAGNOSIS — R079 Chest pain, unspecified: Secondary | ICD-10-CM

## 2024-10-22 DIAGNOSIS — Z951 Presence of aortocoronary bypass graft: Secondary | ICD-10-CM

## 2024-10-22 DIAGNOSIS — I4719 Other supraventricular tachycardia: Secondary | ICD-10-CM | POA: Diagnosis not present

## 2024-10-22 DIAGNOSIS — I2510 Atherosclerotic heart disease of native coronary artery without angina pectoris: Secondary | ICD-10-CM | POA: Insufficient documentation

## 2024-10-22 DIAGNOSIS — E782 Mixed hyperlipidemia: Secondary | ICD-10-CM

## 2024-10-22 DIAGNOSIS — R072 Precordial pain: Secondary | ICD-10-CM

## 2024-10-22 MED ORDER — OMEPRAZOLE 40 MG PO CPDR: DELAYED_RELEASE_CAPSULE | ORAL | 3 refills | Status: AC

## 2024-10-22 NOTE — Progress Notes (Signed)
 "  Cardiology Consultation:    Date:  10/22/2024   ID:  Scott Mcguire, DOB [birth year], MRN [id number]  PCP:  Pia Kerney SQUIBB, MD  Cardiologist:  Alean JONELLE Kobus, MD   Referring MD: Pia Kerney SQUIBB, MD   No chief complaint on file.    ASSESSMENT AND PLAN:   Ms. Schleifer 54 year old man history of CAD s/p abnormal cardiac CT in June 2024 followed up with cardiac cath July 2024 revealing multivessel disease and underwent three-vessel CABG 05/16/2023 [LIMA-LAD, SVG-ramus, SVG-diagonal], normal biventricular function on echocardiogram 04/04/2023 LVEF 60 to 65%, hypertension, hyperlipidemia [elevated lipoprotein a 91.9 on 04/03/2023], paroxysmal SVT/atrial tachycardia on heart monitor October 2021, anxiety, chronic prostatitis.  Legally blind. IntraOp TEE 05/16/2023 noted LVEF 55 to 60%    With symptoms of chest discomfort, atypical in nature.  Problem List Items Addressed This Visit       Cardiovascular and Mediastinum   Hypertension   Well-controlled. Continue diltiazem  to 40 mg once daily and metoprolol  25 mg twice daily.  Target blood pressure below 130/80 mmHg.      PAT (paroxysmal atrial tachycardia)   History of paroxysmal SVT/atrial tachycardia on previous Zio patch monitor.  With the symptoms of dizziness and lightheadedness occasionally we will proceed with a repeat Zio patch after his stress test was completed.      CAD (coronary artery disease)    s/p abnormal cardiac CT in June 2024 followed up with cardiac cath July 2024 revealing multivessel disease and underwent three-vessel CABG 05/16/2023 [LIMA-LAD, SVG-ramus, SVG-diagonal], normal biventricular function on echocardiogram 04/04/2023 LVEF 60 to 65%  Now with atypical chest pain symptoms. More likely to be noncardiac, however he had similar symptoms prior to his CAD and CABG in the past and he is extremely concerned about cardiac etiology of this.  Will proceed with cardiac PET stress test. Will obtain  transthoracic echocardiogram to rule out any significant wall motion abnormalities or LV dysfunction.  Continue aspirin  he has been taking 325 mg once daily, okay to continue for now until further cardiac ischemic workup is completed. Subsequently lowered the dose to 81 mg once daily if no significant ischemia.  Continue atorvastatin  and Zetia . Has sublingual nitroglycerin  to use as needed.      Relevant Orders   NM PET CT CARDIAC PERFUSION MULTI W/ABSOLUTE BLOODFLOW   ECHOCARDIOGRAM COMPLETE   Cardiac Stress Test: Informed Consent Details: Physician/Practitioner Attestation; Transcribe to consent form and obtain patient signature     Other   Mixed hyperlipidemia   Lipid panel 07/02/2024 with direct LDL 66,  Good control Continue atorvastatin  80 mg once daily and Zetia  10 mg once daily.      S/P CABG x 3   Relevant Orders   NM PET CT CARDIAC PERFUSION MULTI W/ABSOLUTE BLOODFLOW   ECHOCARDIOGRAM COMPLETE   Chest pain of uncertain etiology - Primary   With atypical chest pain symptoms, workup with cardiac PET stress test as reviewed above under CAD.  His symptoms are also possibly of GI origin.  Switch from Prilosec to pantoprazole  40 mg twice daily for 7 days after that 40 mg once daily. Continue Tums as needed.       Relevant Orders   EKG 12-Lead (Completed)   NM PET CT CARDIAC PERFUSION MULTI W/ABSOLUTE BLOODFLOW   ECHOCARDIOGRAM COMPLETE   Cardiac Stress Test: Informed Consent Details: Physician/Practitioner Attestation; Transcribe to consent form and obtain patient signature   Other Visit Diagnoses       Palpitations  Relevant Orders   LONG TERM MONITOR (3-14 DAYS)     Precordial pain       Relevant Orders   NM PET CT CARDIAC PERFUSION MULTI W/ABSOLUTE BLOODFLOW       .  Return to clinic tentatively in 2 months.  History of Present Illness:    Scott Mcguire is a 54 y.o. male who is being seen today for follow-up visit. PCP is Pia Kerney SQUIBB,  MD. Previously followed up with Dr. Sheena and [facility]. Last visit with Delon Hoover, NP-C was 07/02/2024. Here for an earlier follow-up visit given ongoing symptoms of chest discomfort  Pleasant man here for the visit accompanied by his wife.  Legally blind.  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   Has history of CAD s/p abnormal cardiac CT in June 2024 followed up with cardiac cath July 2024 revealing multivessel disease and underwent three-vessel CABG 05/16/2023 [LIMA-LAD, SVG-ramus, SVG-diagonal], normal biventricular function on echocardiogram 04/04/2023 LVEF 60 to 65%, hypertension, hyperlipidemia [elevated lipoprotein a 91.9 on 04/03/2023], paroxysmal SVT/atrial tachycardia on heart monitor October 2021, anxiety, chronic prostatitis.  Legally blind. IntraOp TEE 05/16/2023 noted LVEF 55 to 60%  Lipid panel 07/02/2024 with direct LDL 66, hemoglobin A1c 5.4, BUN 21, creatinine 1.19, eGFR 73 Normal transaminases and alkaline phosphatase  Pleasant man mentions had been doing well.  Over the last several weeks dealing with symptoms of chest discomfort which are atypical and he describes as very much similar in nature to the episodes he was having prior to having the cardiac cath and CABG. Reportedly did well after his CABG with resolution of his symptoms and did not well with his cardiac rehab. Now over the last few weeks dealing with symptoms of chest pain.  Describes this as a sensation retrosternal to epigastric region.  There are instances where he mostly associates this with food intake.  Has happened at other times. No obvious trigger with position, time of the day.  Had episodes waking him up around 2 AM in the morning. Last for several minutes. No significant relief with his current treatment with omeprazole  and Tums which makes him feel symptoms are more related to his heart as they were in the past.  Also describes symptoms of dizziness that occurs occasionally  with a sense of fast heartbeat.  Good compliance with his medications.  EKG in the clinic today shows sinus rhythm heart rate 61/min, PR interval 160 ms, QRS duration 90 ms, QTc 410 ms.  Anterior Q waves.  No ischemic changes. Past Medical History:  Diagnosis Date   Anginal pain    Anxiety    Chronic prostatitis 06/02/2014   Disorder of male genital organs 06/02/2014   Dyspnea    Dyspnea on exertion 10/29/2020   Erectile dysfunction due to arterial insufficiency 04/30/2024   Essential hypertension 10/29/2020   Flank pain 06/02/2014   Generalized anxiety disorder 04/30/2024   Herpesviral vesicular dermatitis 04/30/2024   Hypercholesterolemia 06/02/2014   Hypertension 06/02/2014   Hypogonadism in male 06/02/2014   Migraine headache 06/02/2014   Mixed hyperlipidemia 10/29/2020   Obesity (BMI 30-39.9) 07/23/2020   PAT (paroxysmal atrial tachycardia) 07/23/2020   S/P CABG x 3 05/16/2023   Symptomatic PVCs 07/23/2020   Tachycardia     Past Surgical History:  Procedure Laterality Date   CORONARY ARTERY BYPASS GRAFT N/A 05/16/2023   Procedure: CORONARY ARTERY BYPASS GRAFTING (CABG) X3 USING LEFT INTERNAL MAMMARY ARTERY (LIMA) AND ENDOSCOPICALLY HARVESTED RIGHT GREATER SAPHENOUS VEIN;  Surgeon: Shyrl Linnie KIDD, MD;  Location: [facility] OR;  Service: Open Heart Surgery;  Laterality: N/A;   LEFT HEART CATH AND CORONARY ANGIOGRAPHY N/A 04/04/2023   Procedure: LEFT HEART CATH AND CORONARY ANGIOGRAPHY;  Surgeon: Dann Candyce RAMAN, MD;  Location[facility] INVASIVE CV LAB;  Service: Cardiovascular;  Laterality: N/A;   NO PAST SURGERIES     TEE WITHOUT CARDIOVERSION N/A 05/16/2023   Procedure: TRANSESOPHAGEAL ECHOCARDIOGRAM;  Surgeon: Shyrl Linnie KIDD, MD;  Location: [facility] OR;  Service: Open Heart Surgery;  Laterality: N/A;    Current Medications: Active Medications[1]   Allergies:   Ceftin [cefuroxime]   Social History   Socioeconomic History   Marital status: Married    Spouse name: Not on  file   Number of children: Not on file   Years of education: Not on file   Highest education level: Not on file  Occupational History   Not on file  Tobacco Use   Smoking status: Never   Smokeless tobacco: Never  Substance and Sexual Activity   Alcohol use: Never   Drug use: Never   Sexual activity: Not on file  Other Topics Concern   Not on file  Social History Narrative   Not on file   Social Drivers of Health   Tobacco Use: Low Risk (10/22/2024)   Patient History    Smoking Tobacco Use: Never    Smokeless Tobacco Use: Never    Passive Exposure: Not on file  Financial Resource Strain: Not on file  Food Insecurity: Not on file  Transportation Needs: Not on file  Physical Activity: Not on file  Stress: Not on file  Social Connections: Not on file  Depression (EYV7-0): Not on file  Alcohol Screen: Not on file  Housing: Not on file  Utilities: Not on file  Health Literacy: Not on file     Family History: The patient's family history includes Heart disease in his father and mother. ROS:   Please see the history of present illness.    All 14 point review of systems negative except as described per history of present illness.  EKGs/Labs/Other Studies Reviewed:    The following studies were reviewed today:   EKG:  EKG Interpretation Date/Time:  Wednesday October 22 2024 11:14:50 EST Ventricular Rate:  61 PR Interval:  160 QRS Duration:  90 QT Interval:  408 QTC Calculation: 410 R Axis:   35  Text Interpretation: Normal sinus rhythm Possible Anterior infarct , age undetermined Abnormal ECG When compared with ECG of 02-Jul-2024 09:27, No significant change was found Confirmed by Liborio Hai reddy [id number]) on 10/22/2024 11:17:18 AM    Recent Labs: 07/02/2024: ALT 14; BUN 21; Creatinine, Ser 1.19; Hemoglobin 15.4; Platelets 308; Potassium 4.7; Sodium 138  Recent Lipid Panel    Component Value Date/Time   LDLDIRECT 66 07/02/2024 1001    Physical Exam:     VS:  BP 130/88   Pulse 60   Ht 5' 10 (1.778 m)   Wt 190 lb 6.4 oz (86.4 kg)   SpO2 97%   BMI 27.32 kg/m     Wt Readings from Last 3 Encounters:  10/22/24 190 lb 6.4 oz (86.4 kg)  07/02/24 191 lb (86.6 kg)  10/04/23 190 lb (86.2 kg)     GENERAL:  Well nourished, well developed in no acute distress NECK: No JVD; No carotid bruits CARDIAC: RRR, S1 and S2 present, no murmurs, no rubs, no gallops CHEST:  Clear to auscultation without rales, wheezing or rhonchi  Extremities: No pitting pedal edema. Pulses bilaterally symmetric with radial 2+ and dorsalis pedis 2+ NEUROLOGIC:  Alert and oriented x 3  Medication Adjustments/Labs and Tests Ordered: Current medicines are reviewed at length with the patient today.  Concerns regarding medicines are outlined above.  Orders Placed This Encounter  Procedures   NM PET CT CARDIAC PERFUSION MULTI W/ABSOLUTE BLOODFLOW   Cardiac Stress Test: Informed Consent Details: Physician/Practitioner Attestation; Transcribe to consent form and obtain patient signature   LONG TERM MONITOR (3-14 DAYS)   EKG 12-Lead   ECHOCARDIOGRAM COMPLETE   Meds ordered this encounter  Medications   omeprazole  (PRILOSEC) 40 MG capsule    Sig: Take 40 mg (1 tablet) twice daily for 7 days then decrease to 40 mg (1 tablet) daily    Dispense:  97 capsule    Refill:  3    Signed, Keaten Mashek reddy Chala Gul, MD, MPH, [facility]. 10/22/2024 11:28 AM    [facility]     [1]  Current Meds  Medication Sig   acetaminophen  (TYLENOL ) 500 MG tablet Take 1,000 mg by mouth every 6 (six) hours as needed (pain/headaches.).   ALPRAZolam  (XANAX ) 1 MG tablet Take 1 mg by mouth at bedtime.   aspirin  EC 325 MG tablet Take 1 tablet (325 mg total) by mouth daily.   atorvastatin  (LIPITOR ) 80 MG tablet Take 80 mg by mouth in the morning.   cetirizine (ZYRTEC) 10 MG tablet Take 10 mg by mouth daily as needed for allergies.   diltiazem  (CARDIZEM  CD) 240 MG 24 hr capsule  Take 1 capsule (240 mg total) by mouth daily.   ezetimibe  (ZETIA ) 10 MG tablet Take 1 tablet (10 mg total) by mouth at bedtime.   guaiFENesin  (MUCINEX ) 600 MG 12 hr tablet Take 1 tablet (600 mg total) by mouth 2 (two) times daily.   ketotifen  (ALAWAY ) 0.035 % ophthalmic solution Place 1 drop into both eyes 2 (two) times daily as needed (allergy eyes/irritation.).   metoprolol  tartrate (LOPRESSOR ) 25 MG tablet Take 1 tablet (25 mg total) by mouth 2 (two) times daily.   nitroGLYCERIN  (NITROSTAT ) 0.4 MG SL tablet Place 1 tablet (0.4 mg total) under the tongue every 5 (five) minutes as needed for chest pain.   [DISCONTINUED] omeprazole  (PRILOSEC) 40 MG capsule Take 40 mg by mouth daily before breakfast.   "

## 2024-10-22 NOTE — Assessment & Plan Note (Addendum)
"   s/p abnormal cardiac CT in June 2024 followed up with cardiac cath July 2024 revealing multivessel disease and underwent three-vessel CABG 05/16/2023 [LIMA-LAD, SVG-ramus, SVG-diagonal], normal biventricular function on echocardiogram 04/04/2023 LVEF 60 to 65%  Now with atypical chest pain symptoms. More likely to be noncardiac, however he had similar symptoms prior to his CAD and CABG in the past and he is extremely concerned about cardiac etiology of this.  Will proceed with cardiac PET stress test. Will obtain transthoracic echocardiogram to rule out any significant wall motion abnormalities or LV dysfunction.  Continue aspirin  he has been taking 325 mg once daily, okay to continue for now until further cardiac ischemic workup is completed. Subsequently lowered the dose to 81 mg once daily if no significant ischemia.  Continue atorvastatin  and Zetia . Has sublingual nitroglycerin  to use as needed. "

## 2024-10-22 NOTE — Assessment & Plan Note (Signed)
 History of paroxysmal SVT/atrial tachycardia on previous Zio patch monitor.  With the symptoms of dizziness and lightheadedness occasionally we will proceed with a repeat Zio patch after his stress test was completed.

## 2024-10-22 NOTE — Assessment & Plan Note (Signed)
 Well-controlled. Continue diltiazem  to 40 mg once daily and metoprolol  25 mg twice daily.  Target blood pressure below 130/80 mmHg.

## 2024-10-22 NOTE — Assessment & Plan Note (Signed)
 Lipid panel 07/02/2024 with direct LDL 66,  Good control Continue atorvastatin  80 mg once daily and Zetia  10 mg once daily.

## 2024-10-22 NOTE — Assessment & Plan Note (Signed)
 With atypical chest pain symptoms, workup with cardiac PET stress test as reviewed above under CAD.  His symptoms are also possibly of GI origin.  Switch from Prilosec to pantoprazole  40 mg twice daily for 7 days after that 40 mg once daily. Continue Tums as needed.

## 2024-10-22 NOTE — Patient Instructions (Addendum)
 Medication Instructions:  Your physician has recommended you make the following change in your medication:   Start Protonix  40 mg twice daily for 7 days then decrease to 40 mg daily thereafter.  *If you need a refill on your cardiac medications before your next appointment, please call your pharmacy*   Lab Work: None ordered If you have labs (blood work) drawn today and your tests are completely normal, you will receive your results only by: MyChart Message (if you have MyChart) OR A paper copy in the mail If you have any lab test that is abnormal or we need to change your treatment, we will call you to review the results.  Testing/Procedures: Your physician has requested that you have an echocardiogram. Echocardiography is a painless test that uses sound waves to create images of your heart. It provides your doctor with information about the size and shape of your heart and how well your hearts chambers and valves are working. This procedure takes approximately one hour. There are no restrictions for this procedure. Please do NOT wear cologne, perfume, aftershave, or lotions (deodorant is allowed). Please arrive 15 minutes prior to your appointment time.  Please note: We ask at that you not bring children with you during ultrasound (echo/ vascular) testing. Due to room size and safety concerns, children are not allowed in the ultrasound rooms during exams. Our front office staff cannot provide observation of children in our lobby area while testing is being conducted. An adult accompanying a patient to their appointment will only be allowed in the ultrasound room at the discretion of the ultrasound technician under special circumstances. We apologize for any inconvenience.     Please report to Radiology at the [facility] Main Entrance 30 minutes early for your test.  [address] [place], [place] [address]           How to Prepare for Your Cardiac PET/CT Stress  Test:  Nothing to eat or drink, except water, 3 hours prior to arrival time.  NO caffeine/decaffeinated products, or chocolate 12 hours prior to arrival. (Please note decaffeinated beverages (teas/coffees) still contain caffeine).  If you have caffeine within 12 hours prior, the test will need to be rescheduled.  Medication instructions: Do not take erectile dysfunction medications for 72 hours prior to test (sildenafil, tadalafil) Do not take nitrates (isosorbide  mononitrate, Ranexa) the day before or day of test Do not take tamsulosin the day before or morning of test Hold theophylline containing medications for 12 hours. Hold Dipyridamole 48 hours prior to the test.  Diabetic Preparation: If able to eat breakfast prior to 3 hour fasting, you may take all medications, including your insulin . Do not worry if you miss your breakfast dose of insulin  - start at your next meal. If you do not eat prior to 3 hour fast-Hold all diabetes (oral and insulin ) medications. Patients who wear a continuous glucose monitor MUST remove the device prior to scanning.  You may take your remaining medications with water.  NO perfume, cologne or lotion on chest or abdomen area.  Total time is 1 to 2 hours; you may want to bring reading material for the waiting time.  In preparation for your appointment, medication and supplies will be purchased.  Appointment availability is limited, so if you need to cancel or reschedule, please call the Radiology Department Scheduler at [phone] 24 hours in advance to avoid a cancellation fee of $100.00  What to Expect When you Arrive:  Once you  arrive and check in for your appointment, you will be taken to a preparation room within the Radiology Department.  A technologist or Nurse will obtain your medical history, verify that you are correctly prepped for the exam, and explain the procedure.  Afterwards, an IV will be started in your arm and electrodes will be placed on  your skin for EKG monitoring during the stress portion of the exam. Then you will be escorted to the PET/CT scanner.  There, staff will get you positioned on the scanner and obtain a blood pressure and EKG.  During the exam, you will continue to be connected to the EKG and blood pressure machines.  A small, safe amount of a radioactive tracer will be injected in your IV to obtain a series of pictures of your heart along with an injection of a stress agent.    After your Exam:  It is recommended that you eat a meal and drink a caffeinated beverage to counter act any effects of the stress agent.  Drink plenty of fluids for the remainder of the day and urinate frequently for the first couple of hours after the exam.  Your doctor will inform you of your test results within 7-10 business days.  For more information and frequently asked questions, please visit our website: [email or web address]  For questions about your test or how to prepare for your test, please call: Cardiac Imaging Nurse Navigators Office: [phone]   A zio monitor was ordered today. It will arrive at your home to be applied after your cardiac PET scan. You will need to wear the monitor for 14 days. You will then return monitor and event diary in provided box. It takes 1-2 weeks for report to be downloaded and returned to us . We will call you with the results. If monitor falls off or has orange flashing light, please call [employer] for further instructions.   Follow-Up: At [facility], you and your health needs are our priority.  As part of our continuing mission to provide you with exceptional heart care, we have created designated Provider Care Teams.  These Care Teams include your primary Cardiologist (physician) and Advanced Practice Providers (APPs -  Physician Assistants and Nurse Practitioners) who all work together to provide you with the care you need, when you need it.  We recommend signing up for the patient  portal called MyChart.  Sign up information is provided on this After Visit Summary.  MyChart is used to connect with patients for Virtual Visits (Telemedicine).  Patients are able to view lab/test results, encounter notes, upcoming appointments, etc.  Non-urgent messages can be sent to your provider as well.   To learn more about what you can do with MyChart, go to [email or web address].    Your next appointment:   2 month(s)  The format for your next appointment:   In Person  Provider:   Alean Madireddy, MD   Other Instructions Echocardiogram An echocardiogram is a test that uses sound waves (ultrasound) to produce images of the heart. Images from an echocardiogram can provide important information about: Heart size and shape. The size and thickness and movement of your heart's walls. Heart muscle function and strength. Heart valve function or if you have stenosis. Stenosis is when the heart valves are too narrow. If blood is flowing backward through the heart valves (regurgitation). A tumor or infectious growth around the heart valves. Areas of heart muscle that are not working well because of poor blood flow or injury from  a heart attack. Aneurysm detection. An aneurysm is a weak or damaged part of an artery wall. The wall bulges out from the normal force of blood pumping through the body. Tell a health care provider about: Any allergies you have. All medicines you are taking, including vitamins, herbs, eye drops, creams, and over-the-counter medicines. Any blood disorders you have. Any surgeries you have had. Any medical conditions you have. Whether you are pregnant or may be pregnant. What are the risks? Generally, this is a safe test. However, problems may occur, including an allergic reaction to dye (contrast) that may be used during the test. What happens before the test? No specific preparation is needed. You may eat and drink normally. What happens during the  test? You will take off your clothes from the waist up and put on a hospital gown. Electrodes or electrocardiogram (ECG)patches may be placed on your chest. The electrodes or patches are then connected to a device that monitors your heart rate and rhythm. You will lie down on a table for an ultrasound exam. A gel will be applied to your chest to help sound waves pass through your skin. A handheld device, called a transducer, will be pressed against your chest and moved over your heart. The transducer produces sound waves that travel to your heart and bounce back (or echo back) to the transducer. These sound waves will be captured in real-time and changed into images of your heart that can be viewed on a video monitor. The images will be recorded on a computer and reviewed by your health care provider. You may be asked to change positions or hold your breath for a short time. This makes it easier to get different views or better views of your heart. In some cases, you may receive contrast through an IV in one of your veins. This can improve the quality of the pictures from your heart. The procedure may vary among health care providers and hospitals.   What can I expect after the test? You may return to your normal, everyday life, including diet, activities, and medicines, unless your health care provider tells you not to do that. Follow these instructions at home: It is up to you to get the results of your test. Ask your health care provider, or the department that is doing the test, when your results will be ready. Keep all follow-up visits. This is important. Summary An echocardiogram is a test that uses sound waves (ultrasound) to produce images of the heart. Images from an echocardiogram can provide important information about the size and shape of your heart, heart muscle function, heart valve function, and other possible heart problems. You do not need to do anything to prepare before this  test. You may eat and drink normally. After the echocardiogram is completed, you may return to your normal, everyday life, unless your health care provider tells you not to do that. This information is not intended to replace advice given to you by your health care provider. Make sure you discuss any questions you have with your health care provider. Document Revised: 05/11/2020 Document Reviewed: 05/11/2020 [employer] Patient Education  2021 [employer]   Important Information About Sugar

## 2024-11-19 ENCOUNTER — Ambulatory Visit

## 2024-11-25 ENCOUNTER — Other Ambulatory Visit (HOSPITAL_COMMUNITY)

## 2024-12-24 ENCOUNTER — Ambulatory Visit
